# Patient Record
Sex: Female | Born: 1940 | Hispanic: No | Marital: Married | Smoking: Never smoker
Health system: Southern US, Community
[De-identification: ages and names within clinical notes are randomized; demographics above are authoritative.]

## PROBLEM LIST (undated history)

## (undated) DIAGNOSIS — I10 Essential (primary) hypertension: Secondary | ICD-10-CM

## (undated) DIAGNOSIS — R0600 Dyspnea, unspecified: Secondary | ICD-10-CM

## (undated) DIAGNOSIS — F419 Anxiety disorder, unspecified: Secondary | ICD-10-CM

## (undated) DIAGNOSIS — R011 Cardiac murmur, unspecified: Secondary | ICD-10-CM

## (undated) DIAGNOSIS — K219 Gastro-esophageal reflux disease without esophagitis: Secondary | ICD-10-CM

## (undated) DIAGNOSIS — M1990 Unspecified osteoarthritis, unspecified site: Secondary | ICD-10-CM

## (undated) HISTORY — PX: COLONOSCOPY: SHX174

## (undated) HISTORY — PX: TONSILLECTOMY: SUR1361

## (undated) HISTORY — PX: APPENDECTOMY: SHX54

## (undated) HISTORY — PX: HEMORROIDECTOMY: SUR656

---

## 2005-05-04 ENCOUNTER — Other Ambulatory Visit: Admission: RE | Admit: 2005-05-04 | Discharge: 2005-05-04 | Payer: Self-pay | Admitting: Obstetrics and Gynecology

## 2005-05-15 ENCOUNTER — Encounter: Admission: RE | Admit: 2005-05-15 | Discharge: 2005-05-15 | Payer: Self-pay | Admitting: Obstetrics and Gynecology

## 2006-09-24 ENCOUNTER — Other Ambulatory Visit: Admission: RE | Admit: 2006-09-24 | Discharge: 2006-09-24 | Payer: Self-pay | Admitting: Obstetrics and Gynecology

## 2006-09-24 ENCOUNTER — Encounter: Admission: RE | Admit: 2006-09-24 | Discharge: 2006-09-24 | Payer: Self-pay | Admitting: Obstetrics and Gynecology

## 2007-09-27 ENCOUNTER — Encounter: Admission: RE | Admit: 2007-09-27 | Discharge: 2007-09-27 | Payer: Self-pay | Admitting: Obstetrics and Gynecology

## 2008-09-27 ENCOUNTER — Encounter: Admission: RE | Admit: 2008-09-27 | Discharge: 2008-09-27 | Payer: Self-pay | Admitting: Obstetrics and Gynecology

## 2008-10-02 ENCOUNTER — Other Ambulatory Visit: Admission: RE | Admit: 2008-10-02 | Discharge: 2008-10-02 | Payer: Self-pay | Admitting: Obstetrics and Gynecology

## 2009-11-04 ENCOUNTER — Encounter: Admission: RE | Admit: 2009-11-04 | Discharge: 2009-11-04 | Payer: Self-pay | Admitting: Obstetrics and Gynecology

## 2010-11-10 ENCOUNTER — Encounter: Admission: RE | Admit: 2010-11-10 | Discharge: 2010-11-10 | Payer: Self-pay | Admitting: Internal Medicine

## 2010-11-17 ENCOUNTER — Other Ambulatory Visit
Admission: RE | Admit: 2010-11-17 | Discharge: 2010-11-17 | Payer: Self-pay | Source: Home / Self Care | Admitting: Obstetrics and Gynecology

## 2011-10-19 ENCOUNTER — Other Ambulatory Visit: Payer: Self-pay | Admitting: Internal Medicine

## 2011-10-19 DIAGNOSIS — Z1231 Encounter for screening mammogram for malignant neoplasm of breast: Secondary | ICD-10-CM

## 2011-11-17 ENCOUNTER — Ambulatory Visit
Admission: RE | Admit: 2011-11-17 | Discharge: 2011-11-17 | Disposition: A | Discharge status: Home / Self Care | Payer: Medicare Other | Source: Ambulatory Visit | Attending: Internal Medicine | Admitting: Internal Medicine

## 2011-11-17 DIAGNOSIS — Z1231 Encounter for screening mammogram for malignant neoplasm of breast: Secondary | ICD-10-CM

## 2012-07-28 ENCOUNTER — Other Ambulatory Visit: Payer: Self-pay

## 2012-08-24 ENCOUNTER — Other Ambulatory Visit: Payer: Self-pay | Admitting: *Deleted

## 2012-08-24 ENCOUNTER — Ambulatory Visit
Admission: RE | Admit: 2012-08-24 | Discharge: 2012-08-24 | Disposition: A | Discharge status: Home / Self Care | Payer: Medicare Other | Source: Ambulatory Visit | Attending: *Deleted | Admitting: *Deleted

## 2012-08-24 DIAGNOSIS — R52 Pain, unspecified: Secondary | ICD-10-CM

## 2012-08-24 DIAGNOSIS — R609 Edema, unspecified: Secondary | ICD-10-CM

## 2012-11-30 ENCOUNTER — Other Ambulatory Visit: Payer: Self-pay | Admitting: Internal Medicine

## 2012-11-30 DIAGNOSIS — Z1231 Encounter for screening mammogram for malignant neoplasm of breast: Secondary | ICD-10-CM

## 2013-01-06 ENCOUNTER — Ambulatory Visit
Admission: RE | Admit: 2013-01-06 | Discharge: 2013-01-06 | Disposition: A | Discharge status: Home / Self Care | Payer: Medicare Other | Source: Ambulatory Visit | Attending: Internal Medicine | Admitting: Internal Medicine

## 2013-01-06 DIAGNOSIS — Z1231 Encounter for screening mammogram for malignant neoplasm of breast: Secondary | ICD-10-CM

## 2013-12-25 ENCOUNTER — Other Ambulatory Visit: Payer: Self-pay | Admitting: Internal Medicine

## 2013-12-25 ENCOUNTER — Ambulatory Visit
Admission: RE | Admit: 2013-12-25 | Discharge: 2013-12-25 | Disposition: A | Discharge status: Home / Self Care | Payer: Medicare Other | Source: Ambulatory Visit | Attending: Internal Medicine | Admitting: Internal Medicine

## 2013-12-25 DIAGNOSIS — W19XXXA Unspecified fall, initial encounter: Secondary | ICD-10-CM

## 2013-12-25 DIAGNOSIS — M549 Dorsalgia, unspecified: Secondary | ICD-10-CM

## 2014-01-11 ENCOUNTER — Other Ambulatory Visit: Payer: Self-pay

## 2014-01-11 DIAGNOSIS — Z1231 Encounter for screening mammogram for malignant neoplasm of breast: Secondary | ICD-10-CM

## 2014-01-22 ENCOUNTER — Ambulatory Visit
Admission: RE | Admit: 2014-01-22 | Discharge: 2014-01-22 | Disposition: A | Discharge status: Home / Self Care | Payer: Medicare Other | Source: Ambulatory Visit

## 2014-01-22 ENCOUNTER — Ambulatory Visit: Payer: Medicare Other

## 2014-01-22 DIAGNOSIS — Z1231 Encounter for screening mammogram for malignant neoplasm of breast: Secondary | ICD-10-CM

## 2014-11-07 ENCOUNTER — Ambulatory Visit
Admission: RE | Admit: 2014-11-07 | Discharge: 2014-11-07 | Disposition: A | Discharge status: Home / Self Care | Payer: Medicare Other | Source: Ambulatory Visit | Attending: Internal Medicine | Admitting: Internal Medicine

## 2014-11-07 ENCOUNTER — Other Ambulatory Visit: Payer: Self-pay | Admitting: Internal Medicine

## 2014-11-07 DIAGNOSIS — R042 Hemoptysis: Secondary | ICD-10-CM

## 2015-03-20 ENCOUNTER — Other Ambulatory Visit: Payer: Self-pay

## 2015-03-20 DIAGNOSIS — Z1231 Encounter for screening mammogram for malignant neoplasm of breast: Secondary | ICD-10-CM

## 2015-03-26 ENCOUNTER — Ambulatory Visit
Admission: RE | Admit: 2015-03-26 | Discharge: 2015-03-26 | Disposition: A | Discharge status: Home / Self Care | Payer: Medicare Other | Source: Ambulatory Visit

## 2015-03-26 DIAGNOSIS — Z1231 Encounter for screening mammogram for malignant neoplasm of breast: Secondary | ICD-10-CM

## 2015-03-28 ENCOUNTER — Other Ambulatory Visit: Payer: Self-pay | Admitting: Internal Medicine

## 2015-03-28 DIAGNOSIS — R928 Other abnormal and inconclusive findings on diagnostic imaging of breast: Secondary | ICD-10-CM

## 2015-03-29 ENCOUNTER — Ambulatory Visit
Admission: RE | Admit: 2015-03-29 | Discharge: 2015-03-29 | Disposition: A | Discharge status: Home / Self Care | Payer: Medicare Other | Source: Ambulatory Visit | Attending: Internal Medicine | Admitting: Internal Medicine

## 2015-03-29 DIAGNOSIS — R928 Other abnormal and inconclusive findings on diagnostic imaging of breast: Secondary | ICD-10-CM

## 2015-05-21 ENCOUNTER — Other Ambulatory Visit (HOSPITAL_COMMUNITY): Payer: Self-pay | Admitting: Respiratory Therapy

## 2015-05-21 DIAGNOSIS — R05 Cough: Secondary | ICD-10-CM

## 2015-05-21 DIAGNOSIS — R053 Chronic cough: Secondary | ICD-10-CM

## 2015-05-31 ENCOUNTER — Encounter (HOSPITAL_COMMUNITY): Payer: Medicare Other

## 2015-05-31 ENCOUNTER — Ambulatory Visit (HOSPITAL_COMMUNITY)
Admission: RE | Admit: 2015-05-31 | Discharge: 2015-05-31 | Disposition: A | Discharge status: Home / Self Care | Payer: Medicare Other | Source: Ambulatory Visit | Attending: Internal Medicine | Admitting: Internal Medicine

## 2015-05-31 ENCOUNTER — Other Ambulatory Visit (HOSPITAL_COMMUNITY): Payer: Self-pay | Admitting: Respiratory Therapy

## 2015-05-31 DIAGNOSIS — R053 Chronic cough: Secondary | ICD-10-CM

## 2015-05-31 DIAGNOSIS — R05 Cough: Secondary | ICD-10-CM

## 2015-05-31 LAB — PULMONARY FUNCTION TEST
DL/VA % pred: 148 %
DL/VA: 7.05 ml/min/mmHg/L
DLCO UNC % PRED: 103 %
DLCO UNC: 24.37 ml/min/mmHg
FEF 25-75 Pre: 2.14 L/sec
FEF2575-%Pred-Pre: 132 %
FEV1-%Pred-Pre: 82 %
FEV1-PRE: 1.7 L
FEV1FVC-%PRED-PRE: 110 %
FEV6-%PRED-PRE: 78 %
FEV6-Pre: 2.05 L
FEV6FVC-%Pred-Pre: 105 %
FVC-%Pred-Pre: 74 %
FVC-Pre: 2.05 L
PRE FEV1/FVC RATIO: 83 %
PRE FEV6/FVC RATIO: 100 %

## 2015-06-06 ENCOUNTER — Other Ambulatory Visit: Payer: Self-pay | Admitting: Internal Medicine

## 2015-06-06 ENCOUNTER — Ambulatory Visit
Admission: RE | Admit: 2015-06-06 | Discharge: 2015-06-06 | Disposition: A | Discharge status: Home / Self Care | Payer: Medicare Other | Source: Ambulatory Visit | Attending: Internal Medicine | Admitting: Internal Medicine

## 2015-06-06 DIAGNOSIS — R053 Chronic cough: Secondary | ICD-10-CM

## 2015-06-06 DIAGNOSIS — R05 Cough: Secondary | ICD-10-CM

## 2015-06-19 ENCOUNTER — Other Ambulatory Visit: Payer: Self-pay | Admitting: Dermatology

## 2015-06-20 ENCOUNTER — Other Ambulatory Visit: Payer: Self-pay | Admitting: Internal Medicine

## 2015-06-20 ENCOUNTER — Ambulatory Visit
Admission: RE | Admit: 2015-06-20 | Discharge: 2015-06-20 | Disposition: A | Discharge status: Home / Self Care | Payer: Medicare Other | Source: Ambulatory Visit | Attending: Internal Medicine | Admitting: Internal Medicine

## 2015-06-20 DIAGNOSIS — J189 Pneumonia, unspecified organism: Secondary | ICD-10-CM

## 2015-06-20 DIAGNOSIS — R9389 Abnormal findings on diagnostic imaging of other specified body structures: Secondary | ICD-10-CM

## 2015-06-20 DIAGNOSIS — R053 Chronic cough: Secondary | ICD-10-CM

## 2015-06-20 DIAGNOSIS — R05 Cough: Secondary | ICD-10-CM

## 2015-06-20 MED ORDER — IOPAMIDOL (ISOVUE-300) INJECTION 61%: 75.0000 mL | Freq: Once | INTRAVENOUS | Status: DC | PRN

## 2015-06-20 MED ORDER — IOPAMIDOL (ISOVUE-300) INJECTION 61%
75.0000 mL | Freq: Once | INTRAVENOUS | Status: AC | PRN
  Administered 2015-06-20: 75 mL via INTRAVENOUS

## 2015-07-05 ENCOUNTER — Institutional Professional Consult (permissible substitution): Payer: Medicare Other | Admitting: Internal Medicine

## 2015-07-09 ENCOUNTER — Ambulatory Visit: Payer: Medicare Other | Admitting: Interventional Cardiology

## 2015-08-15 ENCOUNTER — Ambulatory Visit: Payer: Medicare Other | Admitting: Cardiology

## 2016-01-31 ENCOUNTER — Other Ambulatory Visit: Payer: Self-pay | Admitting: Obstetrics and Gynecology

## 2016-01-31 DIAGNOSIS — N644 Mastodynia: Secondary | ICD-10-CM

## 2016-02-05 ENCOUNTER — Ambulatory Visit
Admission: RE | Admit: 2016-02-05 | Discharge: 2016-02-05 | Disposition: A | Discharge status: Home / Self Care | Payer: Medicare Other | Source: Ambulatory Visit | Attending: Obstetrics and Gynecology | Admitting: Obstetrics and Gynecology

## 2016-02-05 DIAGNOSIS — N644 Mastodynia: Secondary | ICD-10-CM

## 2016-04-27 ENCOUNTER — Other Ambulatory Visit: Payer: Self-pay | Admitting: Nurse Practitioner

## 2016-04-27 ENCOUNTER — Ambulatory Visit
Admission: RE | Admit: 2016-04-27 | Discharge: 2016-04-27 | Disposition: A | Discharge status: Home / Self Care | Payer: Medicare Other | Source: Ambulatory Visit | Attending: Nurse Practitioner | Admitting: Nurse Practitioner

## 2016-04-27 DIAGNOSIS — R1030 Lower abdominal pain, unspecified: Secondary | ICD-10-CM

## 2016-08-13 ENCOUNTER — Ambulatory Visit
Admission: RE | Admit: 2016-08-13 | Discharge: 2016-08-13 | Disposition: A | Discharge status: Home / Self Care | Payer: Medicare Other | Source: Ambulatory Visit | Attending: Obstetrics & Gynecology | Admitting: Obstetrics & Gynecology

## 2016-08-13 ENCOUNTER — Other Ambulatory Visit: Payer: Self-pay | Admitting: Obstetrics & Gynecology

## 2016-08-13 DIAGNOSIS — R102 Pelvic and perineal pain: Secondary | ICD-10-CM

## 2016-08-17 ENCOUNTER — Other Ambulatory Visit: Payer: Self-pay | Admitting: Internal Medicine

## 2016-08-17 DIAGNOSIS — R1084 Generalized abdominal pain: Secondary | ICD-10-CM

## 2016-08-18 ENCOUNTER — Ambulatory Visit
Admission: RE | Admit: 2016-08-18 | Discharge: 2016-08-18 | Disposition: A | Discharge status: Home / Self Care | Payer: Medicare Other | Source: Ambulatory Visit | Attending: Internal Medicine | Admitting: Internal Medicine

## 2016-08-18 DIAGNOSIS — R1084 Generalized abdominal pain: Secondary | ICD-10-CM

## 2016-08-18 MED ORDER — IOPAMIDOL (ISOVUE-300) INJECTION 61%
100.0000 mL | Freq: Once | INTRAVENOUS | Status: AC | PRN
  Administered 2016-08-18: 100 mL via INTRAVENOUS

## 2016-08-19 ENCOUNTER — Other Ambulatory Visit: Payer: Self-pay | Admitting: General Surgery

## 2016-11-02 ENCOUNTER — Ambulatory Visit
Admission: RE | Admit: 2016-11-02 | Discharge: 2016-11-02 | Disposition: A | Discharge status: Home / Self Care | Payer: Medicare Other | Source: Ambulatory Visit | Attending: Internal Medicine | Admitting: Internal Medicine

## 2016-11-02 ENCOUNTER — Other Ambulatory Visit: Payer: Self-pay | Admitting: Internal Medicine

## 2016-11-02 DIAGNOSIS — J209 Acute bronchitis, unspecified: Secondary | ICD-10-CM

## 2016-11-10 ENCOUNTER — Inpatient Hospital Stay (HOSPITAL_COMMUNITY)
Admission: RE | Admit: 2016-11-10 | Discharge: 2016-11-10 | Disposition: A | Discharge status: Home / Self Care | Payer: Medicare Other | Source: Ambulatory Visit

## 2016-11-11 ENCOUNTER — Ambulatory Visit (INDEPENDENT_AMBULATORY_CARE_PROVIDER_SITE_OTHER): Payer: Medicare Other | Admitting: Internal Medicine

## 2016-11-11 ENCOUNTER — Encounter: Payer: Self-pay | Admitting: Internal Medicine

## 2016-11-11 ENCOUNTER — Other Ambulatory Visit: Payer: Self-pay | Admitting: Internal Medicine

## 2016-11-11 VITALS — BP 128/70 | HR 78 | Ht 63.0 in | Wt 194.0 lb

## 2016-11-11 DIAGNOSIS — R05 Cough: Secondary | ICD-10-CM

## 2016-11-11 DIAGNOSIS — R058 Other specified cough: Secondary | ICD-10-CM

## 2016-11-11 MED ORDER — ACETAMINOPHEN-CODEINE #3 300-30 MG PO TABS: ORAL_TABLET | ORAL | 0 refills | Status: DC

## 2016-11-11 MED ORDER — PANTOPRAZOLE SODIUM 40 MG PO TBEC: 40.0000 mg | DELAYED_RELEASE_TABLET | Freq: Every day | ORAL | 2 refills | Status: DC

## 2016-11-11 MED ORDER — FAMOTIDINE 20 MG PO TABS: ORAL_TABLET | ORAL | 2 refills | Status: DC

## 2016-11-11 NOTE — Patient Instructions (Addendum)
finiish the amoxicillin and prednisone   Omeprazole 40 mg  Take  30-60 min before first meal of the day and Pepcid (famotidine)  20 mg one hour before  bedtime until return to office - this is the best way to tell whether stomach acid is contributing to your problem.    Marland KitchenGERD (REFLUX)  is an extremely common cause of respiratory symptoms just like yours , many times with no obvious heartburn at all.    It can be treated with medication, but also with lifestyle changes including elevation of the head of your bed (ideally with 6 inch  bed blocks),  Smoking cessation, avoidance of late meals, excessive alcohol, and avoid fatty foods, chocolate, peppermint, colas, red wine, and acidic juices such as orange juice.  NO MINT OR MENTHOL PRODUCTS SO NO COUGH DROPS  USE SUGARLESS CANDY INSTEAD (Jolley ranchers or Stover's or Life Savers) or even ice chips will also do - the key is to swallow to prevent all throat clearing. NO OIL BASED VITAMINS - use powdered substitutes.  For cough > mucinex dm up to 1200 mg every 12 hours add (supplement) tylenol # 3 up to every 4 hours   If not better return the first week in December 2017  Late add: sinus ct/ allergy profile off pred next steps

## 2016-11-11 NOTE — Progress Notes (Addendum)
Subjective:     Patient ID: Rhonda [name], female   DOB: 05-03-1941,     MRN: [id number]  HPI   17 yowf never smoker from [place] lived in [place] since 1967 and [place] since 2006 when noted onset rhinitis mostly in [place] and became ill Oct 19 2016  while in [place] p 10 days there with  cough greens mucus  > home on Nov 5 and rx amox x7  / mucinex and then prednisone and 10 of augementin 11/10/16 and referred to pulmonary clinic 11/11/2016 by Dr   Patsey Berthold    11/11/2016 1st [facility] office visit/ Rhonda [name]   Chief Complaint  Patient presents with  . Pulmonary Consult    Referred by Dr. Fara Olden. Pt c/o cough x 3 wks- prod with green sputum.     takes daily zyrtec for hives Worse at hs / worse to point of choking but otherwise if not coughing breathing is fine  Stopped omeprazole at onset of cough  Mucus is now scant and minimally discolored vs before rx as above Using lots of cough drops seems to help, using voice makes it worse.  No obvious day to day or daytime variability or assoc  mucus plugs or hemoptysis or cp or chest tightness, subjective wheeze or overt sinus or hb symptoms. No unusual exp hx or h/o childhood pna/ asthma or knowledge of premature birth.  Sleeping ok without nocturnal  or early am exacerbation  of respiratory  c/o's or need for noct saba. Also denies any obvious fluctuation of symptoms with weather or environmental changes or other aggravating or alleviating factors except as outlined above   Current Medications, Allergies, Complete Past Medical History, Past Surgical History, Family History, and Social History were reviewed in [employer] record.  ROS  The following are not active complaints unless bolded sore throat, dysphagia, dental problems, itching, sneezing,  nasal congestion or excess/ purulent secretions, ear ache,   fever, chills, sweats, unintended wt loss, classically pleuritic or exertional cp,  orthopnea pnd or leg swelling,  presyncope, palpitations, abdominal pain, anorexia, nausea, vomiting, diarrhea  or change in bowel or bladder habits, change in stools or urine, dysuria,hematuria,  rash, arthralgias, visual complaints, headache, numbness, weakness or ataxia or problems with walking or coordination,  change in mood/affect or memory.         Review of Systems     Objective:   Physical Exam    amb wf wioyr  hoarse    Wt Readings from Last 3 Encounters:  11/11/16 194 lb (88 kg)    Vital signs reviewed - Note on arrival 02 sats  95% on RA    HEENT: nl dentition, turbinates, and oropharynx. Nl external ear canals without cough reflex   NECK :  without JVD/Nodes/TM/ nl carotid upstrokes bilaterally   LUNGS: no acc muscle use,  Nl contour chest which is clear to A and P bilaterally with cough  On exp    CV:  RRR  no s3 or murmur or increase in P2, no edema   ABD:  soft and nontender with nl inspiratory excursion in the supine position. No bruits or organomegaly, bowel sounds nl  MS:  Nl gait/ ext warm without deformities, calf tenderness, cyanosis or clubbing No obvious joint restrictions   SKIN: warm and dry without lesions    NEURO:  alert, approp, nl sensorium with  no motor deficits        I personally reviewed images and agree with  radiology impression as follows:  CXR:   11/02/16 Stable mild central pulmonary vascular prominence without pulmonary edema or infiltrate Assessment:

## 2016-11-12 ENCOUNTER — Ambulatory Visit (HOSPITAL_COMMUNITY): Admission: RE | Admit: 2016-11-12 | Payer: Medicare Other | Source: Ambulatory Visit | Admitting: General Surgery

## 2016-11-12 ENCOUNTER — Encounter (HOSPITAL_COMMUNITY): Admission: RE | Payer: Self-pay | Source: Ambulatory Visit

## 2016-11-12 SURGERY — APPENDECTOMY, LAPAROSCOPIC
Anesthesia: General

## 2016-11-12 NOTE — Assessment & Plan Note (Addendum)
Upper airway cough syndrome (previously labeled PNDS) , is  so named because it's frequently impossible to sort out how much is  CR/sinusitis with freq throat clearing (which can be related to primary GERD)   vs  causing  secondary (" extra esophageal")  GERD from wide swings in gastric pressure that occur with throat clearing, often  promoting self use of mint and menthol lozenges that reduce the lower esophageal sphincter tone and exacerbate the problem further in a cyclical fashion.   These are the same pts (now being labeled as having "irritable larynx syndrome" by some cough centers) who not infrequently have a history of having failed to tolerate ace inhibitors,  dry powder inhalers or biphosphonates or report having atypical/extraesophageal reflux symptoms that don't respond to standard doses of PPI  and are easily confused as having aecopd or asthma flares by even experienced allergists/ pulmonologists (myself included).    Of the three most common causes of chronic cough, only one (GERD)  can actually cause the other two (asthma and post nasal drip syndrome)  and perpetuate the cylce of cough inducing airway trauma, inflammation, heightened sensitivity to reflux which is prompted by the cough itself via a cyclical mechanism.    This may partially respond to steroids and look like asthma and post nasal drainage but never erradicated completely unless the cough and the secondary reflux are eliminated, preferably both at the same time.  While not intuitively obvious, many patients with chronic low grade reflux do not cough until there is a secondary insult that disturbs the protective epithelial barrier and exposes sensitive nerve endings.  This can be viral or direct physical injury such as with an endotracheal tube.   The point is that once this occurs, it is difficult to eliminate using anything but a maximally effective acid suppression regimen at least in the short run, accompanied by an  appropriate diet to address non acid GERD.    So to summarize I think this is uacs precipitated by the pt stopping her gerd rx in the midst of a severe URI which was probably initially viral and would only add at this point the rec for a CT sinus if persists once back on max gerd rx and finishes present course of abx and prednisone  Total time devoted to counseling  = 35/73m review case with pt/husband discussion of options/alternatives/ personally creating written instructions  in presence of pt  then going over those specific  Instructions directly with the pt including how to use all of the meds but in particular covering each new medication in detail and the difference between the maintenance/automatic meds and the prns using an action plan format for the latter.

## 2016-12-01 ENCOUNTER — Institutional Professional Consult (permissible substitution): Payer: Medicare Other | Admitting: Internal Medicine

## 2016-12-08 ENCOUNTER — Ambulatory Visit: Payer: Self-pay | Admitting: General Surgery

## 2016-12-30 ENCOUNTER — Other Ambulatory Visit (HOSPITAL_COMMUNITY): Payer: Medicare Other

## 2016-12-31 ENCOUNTER — Encounter (HOSPITAL_COMMUNITY)
Admission: RE | Admit: 2016-12-31 | Discharge: 2016-12-31 | Disposition: A | Discharge status: Home / Self Care | Payer: Medicare Other | Source: Ambulatory Visit | Attending: General Surgery | Admitting: General Surgery

## 2016-12-31 ENCOUNTER — Encounter (HOSPITAL_COMMUNITY): Payer: Self-pay

## 2016-12-31 DIAGNOSIS — Z01818 Encounter for other preprocedural examination: Secondary | ICD-10-CM | POA: Diagnosis not present

## 2016-12-31 HISTORY — DX: Essential (primary) hypertension: I10

## 2016-12-31 HISTORY — DX: Dyspnea, unspecified: R06.00

## 2016-12-31 HISTORY — DX: Anxiety disorder, unspecified: F41.9

## 2016-12-31 HISTORY — DX: Cardiac murmur, unspecified: R01.1

## 2016-12-31 HISTORY — DX: Unspecified osteoarthritis, unspecified site: M19.90

## 2016-12-31 HISTORY — DX: Gastro-esophageal reflux disease without esophagitis: K21.9

## 2016-12-31 LAB — CBC
HCT: 39.8 % (ref 36.0–46.0)
Hemoglobin: 13.2 g/dL (ref 12.0–15.0)
MCH: 31.9 pg (ref 26.0–34.0)
MCHC: 33.2 g/dL (ref 30.0–36.0)
MCV: 96.1 fL (ref 78.0–100.0)
Platelets: 165 10*3/uL (ref 150–400)
RBC: 4.14 MIL/uL (ref 3.87–5.11)
RDW: 13.9 % (ref 11.5–15.5)
WBC: 7 10*3/uL (ref 4.0–10.5)

## 2016-12-31 LAB — BASIC METABOLIC PANEL
Anion gap: 8 (ref 5–15)
BUN: 15 mg/dL (ref 6–20)
CALCIUM: 9.6 mg/dL (ref 8.9–10.3)
CO2: 28 mmol/L (ref 22–32)
Chloride: 102 mmol/L (ref 101–111)
Creatinine, Ser: 1.02 mg/dL — ABNORMAL HIGH (ref 0.44–1.00)
GFR calc Af Amer: >60 mL/min (ref >60)
GFR, EST NON AFRICAN AMERICAN: 52 mL/min — AB (ref >60)
GLUCOSE: 110 mg/dL — AB (ref 65–99)
Potassium: 3.9 mmol/L (ref 3.5–5.1)
SODIUM: 138 mmol/L (ref 135–145)

## 2016-12-31 NOTE — Progress Notes (Signed)
Pt. Reports that a previously scheduled Lap Appy had to be cancelled due to Bronchitis. Pt. Reports That the bronchitis is still not completely cleared . Pt.'s voice is raspy. Pt. Denies fever, chills, body aches, although she suffers with RA & feels that what (discomfort/aches in body)  & what she is having in her hands etc. Is normal for her & the meds. that she is on has her OA & RA under good control. Pt. Doesn't recall why but saw Dr. Wynonia Lawman & cardiac eval in 2016.  Have requested records from Dr. Wynonia Lawman.

## 2016-12-31 NOTE — Pre-Procedure Instructions (Signed)
Rhonda [name]  12/31/2016      [facility] [place], [place] [address] [place] [address] Phone: [phone] Fax: [place], [place] [facility] [address] [place] [address] [place] [address] Phone: [phone] Fax: [phone]    Your procedure is scheduled on 01/06/2017  Report to [facility] Admitting at 6:30 A.M.  Call this number if you have problems the morning of surgery:  [phone]   Remember:  Do not eat food or drink liquids after midnight. Tuesday   Take these medicines the morning of surgery with A SIP OF WATER : LEVOTHYROXINE, OMEPRAZOLE   Do not wear jewelry, make-up or nail polish.   Do not wear lotions, powders, or perfumes, or deoderant.   Do not shave 48 hours prior to surgery.     Do not bring valuables to the hospital.   [facility] is not responsible for any belongings or valuables.  Contacts, dentures or bridgework may not be worn into surgery.  Leave your suitcase in the car.  After surgery it may be brought to your room.  For patients admitted to the hospital, discharge time will be determined by your treatment team.  Patients discharged the day of surgery will not be allowed to drive home.   Name and phone number of your driver:   With spouse  Special instructions:  Special Instructions:  - Preparing for Surgery  Before surgery, you can play an important role.  Because skin is not sterile, your skin needs to be as free of germs as possible.  You can reduce the number of germs on you skin by washing with CHG (chlorahexidine gluconate) soap before surgery.  CHG is an antiseptic cleaner which kills germs and bonds with the skin to continue killing germs even after washing.  Please DO NOT use if you have an allergy to CHG or antibacterial soaps.  If your skin becomes reddened/irritated stop using  the CHG and inform your nurse when you arrive at Short Stay.  Do not shave (including legs and underarms) for at least 48 hours prior to the first CHG shower.  You may shave your face.  Please follow these instructions carefully:   1.  Shower with CHG Soap the night before surgery and the  morning of Surgery.  2.  If you choose to wash your hair, wash your hair first as usual with your  normal shampoo.  3.  After you shampoo, rinse your hair and body thoroughly to remove the  Shampoo.  4.  Use CHG as you would any other liquid soap.  You can apply chg directly to the skin and wash gently with scrungie or a clean washcloth.  5.  Apply the CHG Soap to your body ONLY FROM THE NECK DOWN.    Do not use on open wounds or open sores.  Avoid contact with your eyes, ears, mouth and genitals (private parts).  Wash genitals (private parts)   with your normal soap.  6.  Wash thoroughly, paying special attention to the area where your surgery will be performed.  7.  Thoroughly rinse your body with warm water from the neck down.  8.  DO NOT shower/wash with your normal soap after using and rinsing off   the CHG Soap.  9.  Pat yourself dry with a clean  towel.            10.  Wear clean pajamas.            11.  Place clean sheets on your bed the night of your first shower and do not sleep with pets.  Day of Surgery  Do not apply any lotions/deodorants the morning of surgery.  Please wear clean clothes to the hospital/surgery center.  Please read over the following fact sheets that you were given. Pain Booklet, Coughing and Deep Breathing and Surgical Site Infection Prevention

## 2017-01-01 NOTE — Progress Notes (Signed)
Anesthesia Chart Review:  Pt is a 76 year old female scheduled for laparoscopic appendectomy on 01/06/2017 with Autumn Messing III, MD.   PMH includes:  HTN, heart murmur, anxiety post-anesthesia, GERD. Never smoker. BMI 34  BP 129/79   Pulse 69   Temp 36.5 C   Resp 20   Ht 5' 3.5" (1.613 m)   Wt 193 lb 12.8 oz (87.9 kg)   SpO2 100%   BMI 33.79 kg/m    Medications include: pepcid, flovent, plaquenil, levothyroxine, methotrexate, olmesartan, prilosec, simvastatin  CXR 11/02/16: Stable mild central pulmonary vascular prominence without pulmonary edema or infiltrate. Calcified aorta.  EKG 12/31/16: NSR.   Treadmill stress test 08/05/15 Wynonia Lawman cardiology): 1. Clinically and EKG negative for ischemia 2. Reduced exercise capacity for age  Pt saw Tollie Eth, MD with cardiology 07/09/2015 for eval for chest pain. Stress test results above. Does not regularly see cardiology.   Pt reported at PAT her surgery was originally scheduled for 10/2016 but was postponed because had bronchitis.  Pt feels bronchitis is still lingering. However, pt was evaluated by pulmonologist Christinia Gully, MD on 11/11/16 and diagnosed with upper airway cough syndrome, not bronchitis.    Pt will need further assessment DOS by assigned anesthesiologist.  If no acute symptoms, I anticipate pt can proceed with surgery as scheduled.   Willeen Cass, FNP-BC [facility] Short Stay Surgical Center/Anesthesiology Phone: [phone] 01/01/2017 4:01 PM

## 2017-01-05 MED ORDER — CEFOTETAN DISODIUM-DEXTROSE 2-2.08 GM-% IV SOLR
2.0000 g | INTRAVENOUS | Status: AC
Start: 2017-01-06 — End: 2017-01-06
  Administered 2017-01-06: 2 g via INTRAVENOUS
  Filled 2017-01-05: qty 50

## 2017-01-06 ENCOUNTER — Encounter (HOSPITAL_COMMUNITY)
Admission: RE | Disposition: A | Discharge status: Home / Self Care | Payer: Self-pay | Source: Ambulatory Visit | Attending: General Surgery

## 2017-01-06 ENCOUNTER — Ambulatory Visit (HOSPITAL_COMMUNITY)
Admission: RE | Admit: 2017-01-06 | Discharge: 2017-01-07 | Disposition: A | Discharge status: Home / Self Care | Payer: Medicare Other | Source: Ambulatory Visit | Attending: General Surgery | Admitting: General Surgery

## 2017-01-06 ENCOUNTER — Ambulatory Visit (HOSPITAL_COMMUNITY): Payer: Medicare Other | Admitting: Emergency Medicine

## 2017-01-06 ENCOUNTER — Encounter (HOSPITAL_COMMUNITY): Payer: Self-pay | Admitting: *Deleted

## 2017-01-06 ENCOUNTER — Ambulatory Visit (HOSPITAL_COMMUNITY): Payer: Medicare Other | Admitting: Anesthesiology

## 2017-01-06 DIAGNOSIS — Z803 Family history of malignant neoplasm of breast: Secondary | ICD-10-CM | POA: Insufficient documentation

## 2017-01-06 DIAGNOSIS — K219 Gastro-esophageal reflux disease without esophagitis: Secondary | ICD-10-CM | POA: Diagnosis not present

## 2017-01-06 DIAGNOSIS — R109 Unspecified abdominal pain: Secondary | ICD-10-CM | POA: Diagnosis present

## 2017-01-06 DIAGNOSIS — F419 Anxiety disorder, unspecified: Secondary | ICD-10-CM | POA: Insufficient documentation

## 2017-01-06 DIAGNOSIS — Z800 Family history of malignant neoplasm of digestive organs: Secondary | ICD-10-CM | POA: Diagnosis not present

## 2017-01-06 DIAGNOSIS — Z7952 Long term (current) use of systemic steroids: Secondary | ICD-10-CM | POA: Insufficient documentation

## 2017-01-06 DIAGNOSIS — E079 Disorder of thyroid, unspecified: Secondary | ICD-10-CM | POA: Insufficient documentation

## 2017-01-06 DIAGNOSIS — Z79899 Other long term (current) drug therapy: Secondary | ICD-10-CM | POA: Diagnosis not present

## 2017-01-06 DIAGNOSIS — D490 Neoplasm of unspecified behavior of digestive system: Secondary | ICD-10-CM | POA: Diagnosis not present

## 2017-01-06 DIAGNOSIS — K388 Other specified diseases of appendix: Secondary | ICD-10-CM | POA: Diagnosis present

## 2017-01-06 DIAGNOSIS — E7800 Pure hypercholesterolemia, unspecified: Secondary | ICD-10-CM | POA: Insufficient documentation

## 2017-01-06 HISTORY — PX: LAPAROSCOPIC APPENDECTOMY: SHX408

## 2017-01-06 SURGERY — APPENDECTOMY, LAPAROSCOPIC
Anesthesia: General

## 2017-01-06 MED ORDER — DEXAMETHASONE SODIUM PHOSPHATE 10 MG/ML IJ SOLN
INTRAMUSCULAR | Status: DC | PRN
  Administered 2017-01-06: 10 mg via INTRAVENOUS

## 2017-01-06 MED ORDER — MIDAZOLAM HCL 2 MG/2ML IJ SOLN
INTRAMUSCULAR | Status: AC
  Filled 2017-01-06: qty 2

## 2017-01-06 MED ORDER — CARBOXYMETHYLCELLULOSE SODIUM 1 % OP SOLN: 1.0000 [drp] | Freq: Every day | OPHTHALMIC | Status: DC

## 2017-01-06 MED ORDER — HYDROXYCHLOROQUINE SULFATE 200 MG PO TABS
200.0000 mg | ORAL_TABLET | Freq: Two times a day (BID) | ORAL | Status: DC
  Administered 2017-01-06 (×2): 200 mg via ORAL
  Filled 2017-01-06 (×3): qty 1

## 2017-01-06 MED ORDER — SUCCINYLCHOLINE CHLORIDE 200 MG/10ML IV SOSY
PREFILLED_SYRINGE | INTRAVENOUS | Status: AC
  Filled 2017-01-06: qty 10

## 2017-01-06 MED ORDER — CHLORHEXIDINE GLUCONATE CLOTH 2 % EX PADS: 6.0000 | MEDICATED_PAD | Freq: Once | CUTANEOUS | Status: DC

## 2017-01-06 MED ORDER — LACTATED RINGERS IV SOLN
INTRAVENOUS | Status: DC | PRN
  Administered 2017-01-06 (×2): via INTRAVENOUS

## 2017-01-06 MED ORDER — MIDAZOLAM HCL 5 MG/5ML IJ SOLN
INTRAMUSCULAR | Status: DC | PRN
  Administered 2017-01-06: 2 mg via INTRAVENOUS

## 2017-01-06 MED ORDER — PANTOPRAZOLE SODIUM 40 MG PO TBEC
40.0000 mg | DELAYED_RELEASE_TABLET | Freq: Every day | ORAL | Status: DC
  Administered 2017-01-06: 40 mg via ORAL
  Filled 2017-01-06: qty 1

## 2017-01-06 MED ORDER — FENTANYL CITRATE (PF) 100 MCG/2ML IJ SOLN
INTRAMUSCULAR | Status: AC
  Filled 2017-01-06: qty 2

## 2017-01-06 MED ORDER — NEOSTIGMINE METHYLSULFATE 10 MG/10ML IV SOLN
INTRAVENOUS | Status: DC | PRN
  Administered 2017-01-06: 4 mg via INTRAVENOUS

## 2017-01-06 MED ORDER — MORPHINE SULFATE (PF) 2 MG/ML IV SOLN: 1.0000 mg | INTRAVENOUS | Status: DC | PRN

## 2017-01-06 MED ORDER — DEXAMETHASONE SODIUM PHOSPHATE 10 MG/ML IJ SOLN
INTRAMUSCULAR | Status: AC
  Filled 2017-01-06: qty 1

## 2017-01-06 MED ORDER — METHOTREXATE 2.5 MG PO TABS: 10.0000 mg | ORAL_TABLET | ORAL | Status: DC

## 2017-01-06 MED ORDER — 0.9 % SODIUM CHLORIDE (POUR BTL) OPTIME
TOPICAL | Status: DC | PRN
  Administered 2017-01-06: 1000 mL

## 2017-01-06 MED ORDER — ONDANSETRON 4 MG PO TBDP: 4.0000 mg | ORAL_TABLET | Freq: Four times a day (QID) | ORAL | Status: DC | PRN

## 2017-01-06 MED ORDER — LEVOTHYROXINE SODIUM 25 MCG PO TABS
25.0000 ug | ORAL_TABLET | Freq: Every day | ORAL | Status: DC
  Administered 2017-01-07: 25 ug via ORAL
  Filled 2017-01-06: qty 1

## 2017-01-06 MED ORDER — BUPIVACAINE HCL 0.25 % IJ SOLN
INTRAMUSCULAR | Status: DC | PRN
  Administered 2017-01-06: 17 mL

## 2017-01-06 MED ORDER — POLYVINYL ALCOHOL 1.4 % OP SOLN
1.0000 [drp] | Freq: Every day | OPHTHALMIC | Status: DC
  Administered 2017-01-06: 1 [drp] via OPHTHALMIC
  Filled 2017-01-06: qty 15

## 2017-01-06 MED ORDER — PHENYLEPHRINE 40 MCG/ML (10ML) SYRINGE FOR IV PUSH (FOR BLOOD PRESSURE SUPPORT)
PREFILLED_SYRINGE | INTRAVENOUS | Status: DC | PRN
  Administered 2017-01-06: 80 ug via INTRAVENOUS
  Administered 2017-01-06: 40 ug via INTRAVENOUS
  Administered 2017-01-06 (×2): 80 ug via INTRAVENOUS

## 2017-01-06 MED ORDER — PROPOFOL 10 MG/ML IV BOLUS
INTRAVENOUS | Status: AC
  Filled 2017-01-06: qty 20

## 2017-01-06 MED ORDER — HYDROCODONE-ACETAMINOPHEN 5-325 MG PO TABS
ORAL_TABLET | ORAL | Status: AC
  Filled 2017-01-06: qty 1

## 2017-01-06 MED ORDER — BUDESONIDE 0.25 MG/2ML IN SUSP
0.2500 mg | Freq: Two times a day (BID) | RESPIRATORY_TRACT | Status: DC
  Administered 2017-01-06 – 2017-01-07 (×2): 0.25 mg via RESPIRATORY_TRACT
  Filled 2017-01-06 (×2): qty 2

## 2017-01-06 MED ORDER — ONDANSETRON HCL 4 MG/2ML IJ SOLN
INTRAMUSCULAR | Status: DC | PRN
  Administered 2017-01-06: 4 mg via INTRAVENOUS

## 2017-01-06 MED ORDER — IRBESARTAN 75 MG PO TABS
37.5000 mg | ORAL_TABLET | Freq: Every day | ORAL | Status: DC
  Administered 2017-01-06: 37.5 mg via ORAL
  Filled 2017-01-06: qty 1

## 2017-01-06 MED ORDER — BUPIVACAINE HCL (PF) 0.25 % IJ SOLN
INTRAMUSCULAR | Status: AC
  Filled 2017-01-06: qty 30

## 2017-01-06 MED ORDER — ONDANSETRON HCL 4 MG/2ML IJ SOLN
INTRAMUSCULAR | Status: AC
  Filled 2017-01-06: qty 2

## 2017-01-06 MED ORDER — LIDOCAINE 2% (20 MG/ML) 5 ML SYRINGE
INTRAMUSCULAR | Status: AC
  Filled 2017-01-06: qty 5

## 2017-01-06 MED ORDER — ONDANSETRON HCL 4 MG/2ML IJ SOLN: 4.0000 mg | Freq: Four times a day (QID) | INTRAMUSCULAR | Status: DC | PRN

## 2017-01-06 MED ORDER — HYDROCODONE-ACETAMINOPHEN 5-325 MG PO TABS
1.0000 | ORAL_TABLET | ORAL | Status: DC | PRN
  Administered 2017-01-06 (×2): 1 via ORAL
  Filled 2017-01-06: qty 1

## 2017-01-06 MED ORDER — FENTANYL CITRATE (PF) 100 MCG/2ML IJ SOLN
25.0000 ug | INTRAMUSCULAR | Status: DC | PRN
  Administered 2017-01-06: 50 ug via INTRAVENOUS
  Administered 2017-01-06: 25 ug via INTRAVENOUS
  Administered 2017-01-06: 50 ug via INTRAVENOUS
  Administered 2017-01-06: 25 ug via INTRAVENOUS

## 2017-01-06 MED ORDER — KCL IN DEXTROSE-NACL 20-5-0.9 MEQ/L-%-% IV SOLN
INTRAVENOUS | Status: DC
  Administered 2017-01-06: 13:00:00 via INTRAVENOUS
  Filled 2017-01-06 (×3): qty 1000

## 2017-01-06 MED ORDER — GLYCOPYRROLATE 0.2 MG/ML IJ SOLN
INTRAMUSCULAR | Status: DC | PRN
  Administered 2017-01-06: 0.6 mg via INTRAVENOUS

## 2017-01-06 MED ORDER — HYDROXYZINE HCL 10 MG PO TABS
10.0000 mg | ORAL_TABLET | Freq: Four times a day (QID) | ORAL | Status: DC | PRN
Start: 2017-01-06 — End: 2017-01-07

## 2017-01-06 MED ORDER — METHOCARBAMOL 500 MG PO TABS
500.0000 mg | ORAL_TABLET | Freq: Four times a day (QID) | ORAL | Status: DC | PRN
  Administered 2017-01-06: 500 mg via ORAL
  Filled 2017-01-06: qty 1

## 2017-01-06 MED ORDER — PHENYLEPHRINE 40 MCG/ML (10ML) SYRINGE FOR IV PUSH (FOR BLOOD PRESSURE SUPPORT)
PREFILLED_SYRINGE | INTRAVENOUS | Status: AC
  Filled 2017-01-06: qty 10

## 2017-01-06 MED ORDER — ROCURONIUM BROMIDE 100 MG/10ML IV SOLN
INTRAVENOUS | Status: DC | PRN
  Administered 2017-01-06: 50 mg via INTRAVENOUS

## 2017-01-06 MED ORDER — SIMVASTATIN 40 MG PO TABS
40.0000 mg | ORAL_TABLET | Freq: Every evening | ORAL | Status: DC
  Administered 2017-01-06: 40 mg via ORAL
  Filled 2017-01-06: qty 1

## 2017-01-06 MED ORDER — LIDOCAINE HCL (CARDIAC) 20 MG/ML IV SOLN
INTRAVENOUS | Status: DC | PRN
Start: 2017-01-06 — End: 2017-01-06
  Administered 2017-01-06: 50 mg via INTRAVENOUS

## 2017-01-06 MED ORDER — FENTANYL CITRATE (PF) 100 MCG/2ML IJ SOLN
INTRAMUSCULAR | Status: DC | PRN
  Administered 2017-01-06 (×2): 25 ug via INTRAVENOUS
  Administered 2017-01-06: 50 ug via INTRAVENOUS

## 2017-01-06 MED ORDER — ROCURONIUM BROMIDE 50 MG/5ML IV SOSY
PREFILLED_SYRINGE | INTRAVENOUS | Status: AC
  Filled 2017-01-06: qty 5

## 2017-01-06 MED ORDER — HEPARIN SODIUM (PORCINE) 5000 UNIT/ML IJ SOLN
5000.0000 [IU] | Freq: Three times a day (TID) | INTRAMUSCULAR | Status: DC
  Administered 2017-01-07: 5000 [IU] via SUBCUTANEOUS
  Filled 2017-01-06: qty 1

## 2017-01-06 MED ORDER — PROPOFOL 10 MG/ML IV BOLUS
INTRAVENOUS | Status: DC | PRN
  Administered 2017-01-06: 140 mg via INTRAVENOUS

## 2017-01-06 SURGICAL SUPPLY — 37 items
ADH SKN CLS APL DERMABOND .7 (GAUZE/BANDAGES/DRESSINGS) ×1
APPLIER CLIP ROT 10 11.4 M/L (STAPLE)
APR CLP MED LRG 11.4X10 (STAPLE)
BAG SPEC RTRVL LRG 6X4 10 (ENDOMECHANICALS) ×1
BLADE SURG ROTATE 9660 (MISCELLANEOUS) IMPLANT
CANISTER SUCTION 2500CC (MISCELLANEOUS) ×3 IMPLANT
CHLORAPREP W/TINT 26ML (MISCELLANEOUS) ×3 IMPLANT
CLIP APPLIE ROT 10 11.4 M/L (STAPLE) IMPLANT
COVER SURGICAL LIGHT HANDLE (MISCELLANEOUS) ×3 IMPLANT
CUTTER FLEX LINEAR 45M (STAPLE) ×3 IMPLANT
DERMABOND ADVANCED (GAUZE/BANDAGES/DRESSINGS) ×2
DERMABOND ADVANCED .7 DNX12 (GAUZE/BANDAGES/DRESSINGS) ×1 IMPLANT
ELECT REM PT RETURN 9FT ADLT (ELECTROSURGICAL) ×3
ELECTRODE REM PT RTRN 9FT ADLT (ELECTROSURGICAL) ×1 IMPLANT
ENDOLOOP SUT PDS II  0 18 (SUTURE)
ENDOLOOP SUT PDS II 0 18 (SUTURE) IMPLANT
GLOVE BIO SURGEON STRL SZ7.5 (GLOVE) ×3 IMPLANT
GOWN STRL REUS W/ TWL LRG LVL3 (GOWN DISPOSABLE) ×3 IMPLANT
GOWN STRL REUS W/TWL LRG LVL3 (GOWN DISPOSABLE) ×9
KIT BASIN OR (CUSTOM PROCEDURE TRAY) ×3 IMPLANT
KIT ROOM TURNOVER OR (KITS) ×3 IMPLANT
NS IRRIG 1000ML POUR BTL (IV SOLUTION) ×3 IMPLANT
PAD ARMBOARD 7.5X6 YLW CONV (MISCELLANEOUS) ×6 IMPLANT
POUCH SPECIMEN RETRIEVAL 10MM (ENDOMECHANICALS) ×3 IMPLANT
RELOAD STAPLE 45 3.5 BLU ETS (ENDOMECHANICALS) ×1 IMPLANT
RELOAD STAPLE TA45 3.5 REG BLU (ENDOMECHANICALS) ×3 IMPLANT
SCALPEL HARMONIC ACE (MISCELLANEOUS) ×3 IMPLANT
SET IRRIG TUBING LAPAROSCOPIC (IRRIGATION / IRRIGATOR) ×3 IMPLANT
SPECIMEN JAR SMALL (MISCELLANEOUS) ×3 IMPLANT
SUT MNCRL AB 4-0 PS2 18 (SUTURE) ×3 IMPLANT
TOWEL OR 17X24 6PK STRL BLUE (TOWEL DISPOSABLE) ×3 IMPLANT
TOWEL OR 17X26 10 PK STRL BLUE (TOWEL DISPOSABLE) ×3 IMPLANT
TRAY FOLEY CATH 16FR SILVER (SET/KITS/TRAYS/PACK) ×3 IMPLANT
TRAY LAPAROSCOPIC MC (CUSTOM PROCEDURE TRAY) ×3 IMPLANT
TROCAR XCEL BLUNT TIP 100MML (ENDOMECHANICALS) ×3 IMPLANT
TROCAR XCEL NON-BLD 5MMX100MML (ENDOMECHANICALS) ×6 IMPLANT
TUBING INSUFFLATION (TUBING) ×3 IMPLANT

## 2017-01-06 NOTE — H&P (Signed)
Rhonda [name]. [place]  Location: [facility] Patient #: [id number] DOB: Jan 19, 1941 Married / Language: English / Race: White Female   History of Present Illness  The patient is a 76 year old female who presents with abdominal pain. We are asked to see the patient in consultation by Dr.Varadarajan to evaluate her for a intra-abdominal mass. The patient is a 76 year old female who developed pain on the left side of her abdomen on Monday evening. She had some slight nausea associated with it. As part of her workup she underwent a CT scan which showed a very mild case of diverticulitis of the descending colon. It also showed a 5 cm mass where the appendix should be. The patient states she has always had problems with constipation. About 4 months ago she had a similar left-sided pain that resolved with laxatives. She denies any fevers or chills. She denies any significant weight loss.   Other Problems  Anxiety Disorder  Arthritis  Diverticulosis  Gastroesophageal Reflux Disease  Heart murmur  Hemorrhoids  High blood pressure  Hypercholesterolemia  Thyroid Disease   Past Surgical History Hemorrhoidectomy  Tonsillectomy   Diagnostic Studies History  Colonoscopy  >10 years ago Mammogram  within last year  Allergies  Aleve *ANALGESICS - ANTI-INFLAMMATORY*  Lisinopril *ANTIHYPERTENSIVES*  AmLODIPine Besylate *CALCIUM CHANNEL BLOCKERS*   Medication History ALPRAZolam (0.5MG  Tablet, Oral as needed) Active. Levothyroxine Sodium (25MCG Tablet, Oral) Active. Hydroxychloroquine Sulfate (200MG  Tablet, Oral) Active. Omeprazole (40MG  Capsule DR, Oral) Active. PredniSONE (5MG  Tablet, Oral) Active. Simvastatin (40MG  Tablet, Oral) Active. RaNITidine HCl (150MG  Tablet, Oral) Active. MetroNIDAZOLE (500MG  Tablet, Oral) Active. Ciprofloxacin HCl (500MG  Tablet, Oral) Active. Methotrexate (2.5MG  Tablet, Oral) Active. Medications Reconciled  Social History   Alcohol use  Moderate alcohol use. Caffeine use  Coffee, Tea. No drug use  Tobacco use  Never smoker.  Family History  Breast Cancer  Daughter. Colon Cancer  Daughter. Heart Disease  Sister.  Pregnancy / Birth History  Age at menarche  30 years. Age of menopause  12-55 Gravida  3 Length (months) of breastfeeding  3-6 Maternal age  41-20 Para  2    Review of Systems General Not Present- Appetite Loss, Chills, Fatigue, Fever, Night Sweats, Weight Gain and Weight Loss. Skin Not Present- Change in Wart/Mole, Dryness, Hives, Jaundice, New Lesions, Non-Healing Wounds, Rash and Ulcer. HEENT Present- Wears glasses/contact lenses. Not Present- Earache, Hearing Loss, Hoarseness, Nose Bleed, Oral Ulcers, Ringing in the Ears, Seasonal Allergies, Sinus Pain, Sore Throat, Visual Disturbances and Yellow Eyes. Respiratory Not Present- Bloody sputum, Chronic Cough, Difficulty Breathing, Snoring and Wheezing. Breast Not Present- Breast Mass, Breast Pain, Nipple Discharge and Skin Changes. Cardiovascular Not Present- Chest Pain, Difficulty Breathing Lying Down, Leg Cramps, Palpitations, Rapid Heart Rate, Shortness of Breath and Swelling of Extremities. Gastrointestinal Present- Abdominal Pain, Bloating, Constipation and Excessive gas. Not Present- Bloody Stool, Change in Bowel Habits, Chronic diarrhea, Difficulty Swallowing, Gets full quickly at meals, Hemorrhoids, Indigestion, Nausea, Rectal Pain and Vomiting. Female Genitourinary Present- Frequency. Not Present- Nocturia, Painful Urination, Pelvic Pain and Urgency. Musculoskeletal Not Present- Back Pain, Joint Pain, Joint Stiffness, Muscle Pain, Muscle Weakness and Swelling of Extremities. Psychiatric Present- Anxiety. Not Present- Bipolar, Change in Sleep Pattern, Depression, Fearful and Frequent crying. Endocrine Not Present- Cold Intolerance, Excessive Hunger, Hair Changes, Heat Intolerance, Hot flashes and New Diabetes. Hematology  Present- Easy Bruising. Not Present- Blood Thinners, Excessive bleeding, Gland problems, HIV and Persistent Infections.  Vitals  Weight: 195 lb Height: 63in Body Surface Area: 1.91 m Body  Mass Index: 34.54 kg/m  Temp.: 5F(Temporal)  Pulse: 77 (Regular)  BP: 128/82 (Sitting, Left Arm, Standard)       Physical Exam General Mental Status-Alert. General Appearance-Consistent with stated age. Hydration-Well hydrated. Voice-Normal.  Head and Neck Head-normocephalic, atraumatic with no lesions or palpable masses. Trachea-midline. Thyroid Gland Characteristics - normal size and consistency.  Eye Eyeball - Bilateral-Extraocular movements intact. Sclera/Conjunctiva - Bilateral-No scleral icterus.  Chest and Lung Exam Chest and lung exam reveals -quiet, even and easy respiratory effort with no use of accessory muscles and on auscultation, normal breath sounds, no adventitious sounds and normal vocal resonance. Inspection Chest Wall - Normal. Back - normal.  Cardiovascular Cardiovascular examination reveals -normal heart sounds, regular rate and rhythm with no murmurs and normal pedal pulses bilaterally.  Abdomen Inspection Inspection of the abdomen reveals - No Hernias. Skin - Scar - no surgical scars. Palpation/Percussion Palpation and Percussion of the abdomen reveal - Soft, Non Tender, No Rebound tenderness, No Rigidity (guarding) and No hepatosplenomegaly. Auscultation Auscultation of the abdomen reveals - Bowel sounds normal.  Neurologic Neurologic evaluation reveals -alert and oriented x 3 with no impairment of recent or remote memory. Mental Status-Normal.  Musculoskeletal Normal Exam - Left-Upper Extremity Strength Normal and Lower Extremity Strength Normal. Normal Exam - Right-Upper Extremity Strength Normal and Lower Extremity Strength Normal.  Lymphatic Head & Neck  General Head & Neck Lymphatics: Bilateral -  Description - Normal. Axillary  General Axillary Region: Bilateral - Description - Normal. Tenderness - Non Tender. Femoral & Inguinal  Generalized Femoral & Inguinal Lymphatics: Bilateral - Description - Normal. Tenderness - Non Tender.    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 Because of its abnormal appearance and because of the risk of cancer I would recommend that this mass be removed. I have discussed with her in detail the risks and benefits of the operation to remove it as well as some of the technical aspects and she understands and wishes to proceed.

## 2017-01-06 NOTE — Op Note (Signed)
01/06/2017  9:47 AM  PATIENT:  Rhonda [name]  76 y.o. female  PRE-OPERATIVE DIAGNOSIS:  MASS OF APPENDIX  POST-OPERATIVE DIAGNOSIS:  MASS OF APPENDIX  PROCEDURE:  Procedure(s): LAPAROSCOPIC APPENDECTOMY (N/A)  SURGEON:  Surgeon(s) and Role:    * Jovita Kussmaul, MD - Primary  PHYSICIAN ASSISTANT:   ASSISTANTS: none   ANESTHESIA:   local and general  EBL:  Total I/O In: 1000 [I.V.:1000] Out: 300 [Urine:300]  BLOOD ADMINISTERED:none  DRAINS: none   LOCAL MEDICATIONS USED:  MARCAINE     SPECIMEN:  Source of Specimen:  appendix  DISPOSITION OF SPECIMEN:  PATHOLOGY  COUNTS:  YES  TOURNIQUET:  * No tourniquets in log *  DICTATION: .Dragon Dictation   After informed consent was obtained patient was brought to the operating room placed in the supine position on the operating room table. After adequate induction of general anesthesia the patient's abdomen was prepped with ChloraPrep, allowed to dry, and draped in usual sterile manner. The area below the umbilicus was infiltrated with quarter percent Marcaine. A small incision was made with a 15 blade knife. This incision was carried down through the subcutaneous tissue bluntly with a hemostat and Army-Navy retractors until the linea alba was identified. The linea alba was incised with a 15 blade knife. Each side was grasped Coker clamps and elevated anteriorly. The preperitoneal space was probed bluntly with a hemostat until the peritoneum was opened and access was gained to the abdominal cavity. A 0 Vicryl purse string stitch was placed in the fascia surrounding the opening. A Hassan cannula was placed through the opening and anchored in place with the previously placed Vicryl purse string stitch. The laparoscope was placed through the [facility] cannula. The abdomen was insufflated with carbon dioxide without difficulty. Next the suprapubic area was infiltrated with quarter percent Marcaine. A small incision was made with a 15 blade  knife. A 5 mm port was placed bluntly through this incision into the abdominal cavity. A site was then chosen between the 2 port for placement of a 5 mm port. The area was infiltrated with quarter percent Marcaine. A small stab incision was made with a 15 blade knife. A 5 mm port was placed bluntly through this incision and the abdominal cavity under direct vision. The laparoscope was then moved to the suprapubic port. Using a Glassman grasper and harmonic scalpel the right lower quadrant was inspected. The appendix was readily identified. There was a large mass of the appendix but the base of the appendix where it joined the cecum appeared normal. The appendix was elevated anteriorly and the mesoappendix was taken down sharply with the harmonic scalpel. Once the base of the appendix where it joined the cecum was identified and cleared of any tissue then a laparoscopic GIA blue load 6 row stapler was placed through the [facility] cannula. The stapler was placed across the base of the appendix clamped and fired thereby dividing the base of the appendix between staple lines. A laparoscopic bag was then inserted through the [facility] cannula. The appendix was placed within the bag and the bag was sealed. The abdomen was then irrigated with copious amounts of saline until the effluent was clear. No other abnormalities were noted. The appendix and bag were removed with the [facility] cannula through the infraumbilical port without difficulty. No other abnormalities were identified on 4 quadrant inspection of the abdomen. The fascial defect was closed with the previously placed Vicryl pursestring stitch as well as with another interrupted  0 Vicryl figure-of-eight stitch. The rest of the ports were removed under direct vision and were found to be hemostatic. The gas was allowed to escape. The skin incisions were closed with interrupted 4-0 Monocryl subcuticular stitches. Dermabond dressings were applied. The patient tolerated the  procedure well. At the end of the case all needle sponge and instrument counts were correct. The patient was then awakened and taken to recovery in stable condition.  PLAN OF CARE: Admit for overnight observation  PATIENT DISPOSITION:  PACU - hemodynamically stable.   Delay start of Pharmacological VTE agent (>24hrs) due to surgical blood loss or risk of bleeding: no

## 2017-01-06 NOTE — Anesthesia Procedure Notes (Addendum)
Procedure Name: Intubation Date/Time: 01/06/2017 8:45 AM Performed by: Marchelle Folks Mekhi Pre-anesthesia Checklist: Patient identified, Emergency Drugs available, Suction available, Patient being monitored and Timeout performed Patient Re-evaluated:Patient Re-evaluated prior to inductionOxygen Delivery Method: Circle system utilized Preoxygenation: Pre-oxygenation with 100% oxygen Intubation Type: IV induction Ventilation: Mask ventilation without difficulty Laryngoscope Size: Mac and 3 Grade View: Grade II Tube type: Oral Tube size: 7.0 mm Number of attempts: 1 Airway Equipment and Method: Stylet Placement Confirmation: ETT inserted through vocal cords under direct vision,  positive ETCO2,  CO2 detector and breath sounds checked- equal and bilateral Secured at: 21 cm Tube secured with: Tape Dental Injury: Teeth and Oropharynx as per pre-operative assessment

## 2017-01-06 NOTE — Anesthesia Postprocedure Evaluation (Addendum)
Anesthesia Post Note  Patient: Rhonda [name]  Procedure(s) Performed: Procedure(s) (LRB): LAPAROSCOPIC APPENDECTOMY (N/A)  Patient location during evaluation: PACU Anesthesia Type: General Level of consciousness: awake and oriented Pain management: pain level controlled Vital Signs Assessment: post-procedure vital signs reviewed and stable Respiratory status: spontaneous breathing, nonlabored ventilation, respiratory function stable and patient connected to nasal cannula oxygen Cardiovascular status: blood pressure returned to baseline and stable Postop Assessment: no signs of nausea or vomiting Anesthetic complications: no       Last Vitals:  Vitals:   01/06/17 1130 01/06/17 1131  BP:  (!) 114/55  Pulse: 85 80  Resp: 18 15  Temp:      Last Pain:  Vitals:   01/06/17 1126  TempSrc:   PainSc: 3                  Daekwon Beswick,JAMES TERRILL

## 2017-01-06 NOTE — Transfer of Care (Signed)
Immediate Anesthesia Transfer of Care Note  Patient: Rhonda [name]  Procedure(s) Performed: Procedure(s): LAPAROSCOPIC APPENDECTOMY (N/A)  Patient Location: PACU  Anesthesia Type:General  Level of Consciousness: awake, alert  and oriented  Airway & Oxygen Therapy: Patient Spontanous Breathing and Patient connected to nasal cannula oxygen  Post-op Assessment: Report given to RN, Post -op Vital signs reviewed and stable and Patient moving all extremities X 4  Post vital signs: Reviewed and stable  Last Vitals:  Vitals:   01/06/17 0646  BP: (!) 157/60  Pulse: 77  Resp: 18  Temp: 36.6 C    Last Pain:  Vitals:   01/06/17 0712  TempSrc:   PainSc: 2       Patients Stated Pain Goal: 2 ([id number])  Complications: No apparent anesthesia complications

## 2017-01-06 NOTE — Interval H&P Note (Signed)
History and Physical Interval Note:  01/06/2017 8:27 AM  Rhonda [name]  has presented today for surgery, with the diagnosis of MASS OF APPENDIX  The various methods of treatment have been discussed with the patient and family. After consideration of risks, benefits and other options for treatment, the patient has consented to  Procedure(s): LAPAROSCOPIC APPENDECTOMY (N/A) as a surgical intervention .  The patient's history has been reviewed, patient examined, no change in status, stable for surgery.  I have reviewed the patient's chart and labs.  Questions were answered to the patient's satisfaction.     TOTH III,Debbie Bellucci S

## 2017-01-06 NOTE — Anesthesia Preprocedure Evaluation (Addendum)
Anesthesia Evaluation  Patient identified by MRN, date of birth, ID band Patient awake    Reviewed: Allergy & Precautions, NPO status , Patient's Chart, lab work & pertinent test results  Airway Mallampati: I  TM Distance: >3 FB Neck ROM: Full    Dental  (+) Teeth Intact   Pulmonary shortness of breath,    breath sounds clear to auscultation       Cardiovascular hypertension,  Rhythm:Regular Rate:Normal     Neuro/Psych    GI/Hepatic Neg liver ROS, GERD  ,  Endo/Other  negative endocrine ROS  Renal/GU negative Renal ROS     Musculoskeletal  (+) Arthritis ,   Abdominal   Peds  Hematology negative hematology ROS (+)   Anesthesia Other Findings   Reproductive/Obstetrics                            Anesthesia Physical Anesthesia Plan  ASA: II  Anesthesia Plan: General   Post-op Pain Management:    Induction: Intravenous  Airway Management Planned: Oral ETT  Additional Equipment:   Intra-op Plan:   Post-operative Plan: Extubation in OR  Informed Consent: I have reviewed the patients History and Physical, chart, labs and discussed the procedure including the risks, benefits and alternatives for the proposed anesthesia with the patient or authorized representative who has indicated his/her understanding and acceptance.   Dental advisory given  Plan Discussed with: CRNA  Anesthesia Plan Comments:         Anesthesia Quick Evaluation

## 2017-01-07 ENCOUNTER — Encounter (HOSPITAL_COMMUNITY): Payer: Self-pay | Admitting: General Surgery

## 2017-01-07 DIAGNOSIS — D490 Neoplasm of unspecified behavior of digestive system: Secondary | ICD-10-CM | POA: Diagnosis not present

## 2017-01-07 MED ORDER — HYDROCODONE-ACETAMINOPHEN 5-325 MG PO TABS: 1.0000 | ORAL_TABLET | ORAL | 0 refills | Status: DC | PRN

## 2017-01-07 NOTE — Progress Notes (Signed)
1 Day Post-Op  Subjective: Doing well. No complaints other than soreness  Objective: Vital signs in last 24 hours: Temp:  [97.5 F (36.4 C)-98.4 F (36.9 C)] 98.2 F (36.8 C) (01/11 0500) Pulse Rate:  [70-95] 76 (01/11 0500) Resp:  [9-19] 18 (01/11 0500) BP: (75-158)/(46-76) 158/55 (01/11 0500) SpO2:  [88 %-99 %] 98 % (01/11 0500) Weight:  [87.5 kg (193 lb)] 87.5 kg (193 lb) (01/10 0712) Last BM Date: 01/05/17  Intake/Output from previous day: 01/10 0701 - 01/11 0700 In: 2265 [P.O.:1170; I.V.:1095] Out: [id number] [Urine:1360; Blood:25] Intake/Output this shift: No intake/output data recorded.  Resp: clear to auscultation bilaterally Cardio: regular rate and rhythm GI: soft, minimal tenderness  Lab Results:  No results for input([name]): WBC, HGB, HCT, PLT in the last 72 hours. BMET No results for input([name]): NA, K, CL, CO2, GLUCOSE, BUN, CREATININE, CALCIUM in the last 72 hours. PT/INR No results for input([name]): LABPROT, INR in the last 72 hours. ABG No results for input([name]): PHART, HCO3 in the last 72 hours.  Invalid input([name]): PCO2, PO2  Studies/Results: No results found.  Anti-infectives: Anti-infectives    Start     Dose/Rate Route Frequency Ordered Stop   01/06/17 1200  hydroxychloroquine (PLAQUENIL) tablet 200 mg     200 mg Oral 2 times daily 01/06/17 1147     01/06/17 0800  cefoTEtan in Dextrose 5% (CEFOTAN) IVPB 2 g     2 g Intravenous To [facility]Stay [facility] 01/05/17 1445 01/06/17 0848      Assessment/Plan: [name]/p Procedure([name]): LAPAROSCOPIC APPENDECTOMY (N/A) Advance diet Discharge  LOS: 0 days    Rhonda [name],Rhonda [name] 01/07/2017

## 2017-01-07 NOTE — Progress Notes (Signed)
IV removed. Discharge paperwork given to patient. Questions answered. Prescription given to patient's husband. Patient is ready to discharge.

## 2017-01-07 NOTE — Discharge Summary (Signed)
Physician Discharge Summary  Patient ID: Rhonda [name] MRN: [id number] DOB/AGE: 1941/12/17 76 y.o.  Admit date: 01/06/2017 Discharge date: 01/07/2017  Admission Diagnoses:  Discharge Diagnoses:  Active Problems:   Mass of appendix   Discharged Condition: good  Hospital Course: the patient underwent lap appy for an appendiceal mass. She tolerated surgery well. On pod 1 she was ready for discharge home  Consults: None  Significant Diagnostic Studies: none  Treatments: surgery: as above  Discharge Exam: Blood pressure (!) 158/55, pulse 76, temperature 98.2 F (36.8 C), temperature source Oral, resp. rate 18, height 5' 3.5" (1.613 m), weight 87.5 kg (193 lb), SpO2 98 %. Resp: clear to auscultation bilaterally Cardio: regular rate and rhythm GI: soft, minimal tenderness  Disposition: Final discharge disposition not confirmed  Discharge Instructions    Call MD for:  difficulty breathing, headache or visual disturbances    Complete by:  As directed    Call MD for:  extreme fatigue    Complete by:  As directed    Call MD for:  hives    Complete by:  As directed    Call MD for:  persistant dizziness or light-headedness    Complete by:  As directed    Call MD for:  persistant nausea and vomiting    Complete by:  As directed    Call MD for:  redness, tenderness, or signs of infection (pain, swelling, redness, odor or green/yellow discharge around incision site)    Complete by:  As directed    Call MD for:  severe uncontrolled pain    Complete by:  As directed    Call MD for:  temperature >100.4    Complete by:  As directed    Diet - low sodium heart healthy    Complete by:  As directed    Discharge instructions    Complete by:  As directed    May shower. No heavy lifting. Diet as tolerated   Increase activity slowly    Complete by:  As directed    No wound care    Complete by:  As directed      Allergies as of 01/07/2017      Reactions   Aleve [naproxen] Other (See  Comments)   GI upset      Medication List    TAKE these medications   acetaminophen-codeine 300-30 MG tablet Commonly known as:  TYLENOL #3 One every 4 hours as needed for cough   CAL-MAG-ZINC-D PO Take 2 tablets by mouth daily.   cetirizine 10 MG tablet Commonly known as:  ZYRTEC Take 10 mg by mouth daily as needed for allergies. Allertec   estradiol 0.1 MG/GM vaginal cream Commonly known as:  ESTRACE Place 1 Applicatorful vaginally 2 (two) times a week.   famotidine 20 MG tablet Commonly known as:  PEPCID TAKE 1 TABLET BY MOUTH AT BEDTIME.   FLOVENT HFA 220 MCG/ACT inhaler Generic drug:  fluticasone Inhale 1 puff into the lungs 2 (two) times daily.   folic acid Q000111Q MCG tablet Commonly known as:  FOLVITE Take 800 mcg by mouth daily.   HYDROcodone-acetaminophen 5-325 MG tablet Commonly known as:  NORCO/VICODIN Take 1-2 tablets by mouth every 4 (four) hours as needed for moderate pain.   hydroxychloroquine 200 MG tablet Commonly known as:  PLAQUENIL Take 200 mg by mouth 2 (two) times daily.   hydrOXYzine 10 MG tablet Commonly known as:  ATARAX/VISTARIL Take 10 mg by mouth every 6 (six) hours as needed for  itching (hives).   levothyroxine 25 MCG tablet Commonly known as:  SYNTHROID, LEVOTHROID Take 25 mcg by mouth daily before breakfast.   methotrexate 2.5 MG tablet Commonly known as:  RHEUMATREX Take 10 mg by mouth every Friday.   multivitamin with minerals Tabs tablet Take 1 tablet by mouth daily.   olmesartan 20 MG tablet Commonly known as:  BENICAR Take 20 mg by mouth daily before breakfast.   omeprazole 40 MG capsule Commonly known as:  PRILOSEC Take 40 mg by mouth daily.   REFRESH TEARS OP Apply 1 drop to eye daily as needed (dry eyes).   REFRESH LIQUIGEL OP Apply 1 drop to eye at bedtime.   simvastatin 40 MG tablet Commonly known as:  ZOCOR Take 40 mg by mouth every evening.   Vitamin D 2000 units Caps Take 4,000 Units by mouth daily.       Follow-up Information    TOTH III,Srah Ake S, MD Follow up in 2 week(s).   Specialty:  General Surgery Contact information: [address] [place] [address] [phone]           Signed: Merrie Roof 01/07/2017, 7:33 AM

## 2017-02-04 ENCOUNTER — Other Ambulatory Visit: Payer: Self-pay | Admitting: Internal Medicine

## 2017-02-04 DIAGNOSIS — Z1231 Encounter for screening mammogram for malignant neoplasm of breast: Secondary | ICD-10-CM

## 2017-02-24 ENCOUNTER — Ambulatory Visit
Admission: RE | Admit: 2017-02-24 | Discharge: 2017-02-24 | Disposition: A | Discharge status: Home / Self Care | Payer: Medicare Other | Source: Ambulatory Visit | Attending: Internal Medicine | Admitting: Internal Medicine

## 2017-02-24 DIAGNOSIS — Z1231 Encounter for screening mammogram for malignant neoplasm of breast: Secondary | ICD-10-CM

## 2017-05-28 NOTE — Addendum Note (Signed)
Addendum  created 05/28/17 0859 by Rica Koyanagi, MD   Sign clinical note

## 2017-05-28 NOTE — Addendum Note (Signed)
Addendum  created 05/28/17 0908 by Rica Koyanagi, MD   Sign clinical note

## 2017-08-19 ENCOUNTER — Other Ambulatory Visit: Payer: Self-pay | Admitting: Obstetrics & Gynecology

## 2018-01-04 ENCOUNTER — Other Ambulatory Visit: Payer: Self-pay | Admitting: General Surgery

## 2018-01-04 DIAGNOSIS — D373 Neoplasm of uncertain behavior of appendix: Secondary | ICD-10-CM

## 2018-01-27 ENCOUNTER — Ambulatory Visit
Admission: RE | Admit: 2018-01-27 | Discharge: 2018-01-27 | Disposition: A | Discharge status: Home / Self Care | Payer: Medicare Other | Source: Ambulatory Visit | Attending: General Surgery | Admitting: General Surgery

## 2018-01-27 DIAGNOSIS — D373 Neoplasm of uncertain behavior of appendix: Secondary | ICD-10-CM

## 2018-02-15 ENCOUNTER — Other Ambulatory Visit: Payer: Self-pay | Admitting: Internal Medicine

## 2018-02-15 DIAGNOSIS — Z1231 Encounter for screening mammogram for malignant neoplasm of breast: Secondary | ICD-10-CM

## 2018-03-07 ENCOUNTER — Ambulatory Visit
Admission: RE | Admit: 2018-03-07 | Discharge: 2018-03-07 | Disposition: A | Discharge status: Home / Self Care | Payer: Medicare Other | Source: Ambulatory Visit | Attending: Internal Medicine | Admitting: Internal Medicine

## 2018-03-07 DIAGNOSIS — Z1231 Encounter for screening mammogram for malignant neoplasm of breast: Secondary | ICD-10-CM

## 2018-06-14 ENCOUNTER — Other Ambulatory Visit: Payer: Self-pay | Admitting: Geriatric Medicine

## 2018-06-14 DIAGNOSIS — R1030 Lower abdominal pain, unspecified: Secondary | ICD-10-CM

## 2018-06-23 ENCOUNTER — Other Ambulatory Visit: Payer: Medicare Other

## 2018-06-28 ENCOUNTER — Ambulatory Visit
Admission: RE | Admit: 2018-06-28 | Discharge: 2018-06-28 | Disposition: A | Discharge status: Home / Self Care | Payer: Medicare Other | Source: Ambulatory Visit | Attending: Geriatric Medicine | Admitting: Geriatric Medicine

## 2018-06-28 DIAGNOSIS — R1030 Lower abdominal pain, unspecified: Secondary | ICD-10-CM

## 2018-06-28 IMAGING — CT CT ABD-PELV W/ CM
1 series · 14 of 32 positions shown, 18 images · IV contrast (APPLIED)
Comparison: 08/18/2016 CT.

CLINICAL DATA: 77-year-old female with lump at navel. History of
appendectomy. Pain left lower quadrant. Constipation. Initial
encounter.

EXAM:
CT ABDOMEN AND PELVIS WITH CONTRAST
TECHNIQUE: Multidetector CT imaging of the abdomen and pelvis was performed
using the standard protocol following bolus administration of
intravenous contrast.
CONTRAST:  100mL 3JM2F6-SZZ IOPAMIDOL (3JM2F6-SZZ) INJECTION 61%

[Series 7: cor · coronal · 0.93mm/px · 14 of 117 slices shown, 18 images]
[im 4/117  lung]
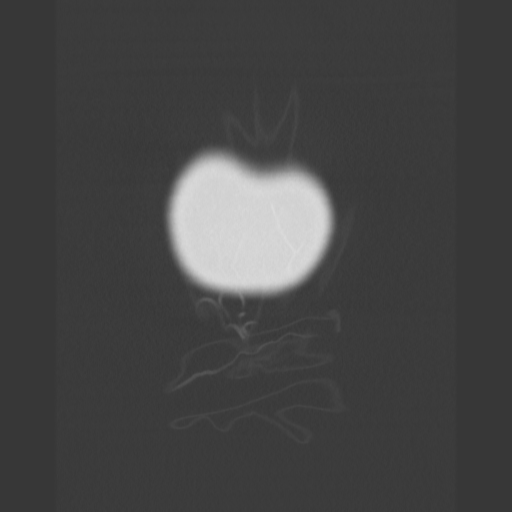
[im 8/117  soft-tissue]
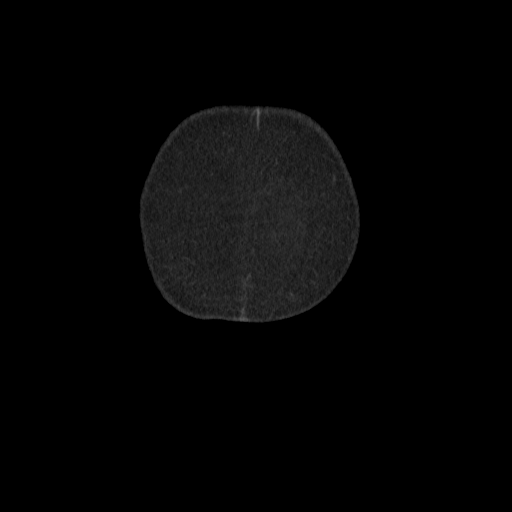
[im 8/117  lung]
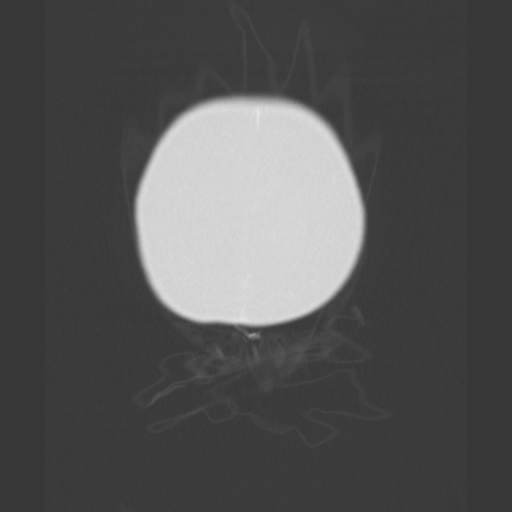
[im 8/117  bone]
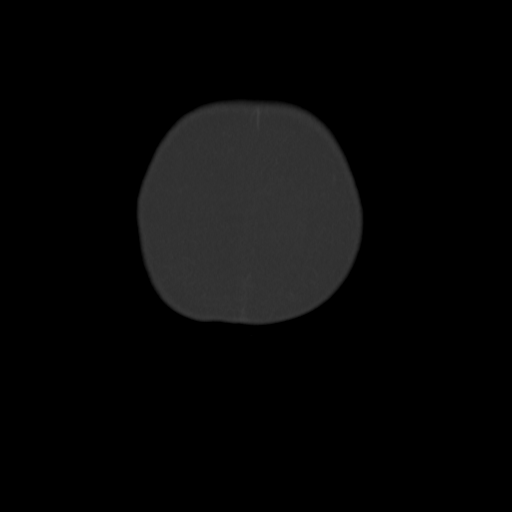
[im 12/117  lung]
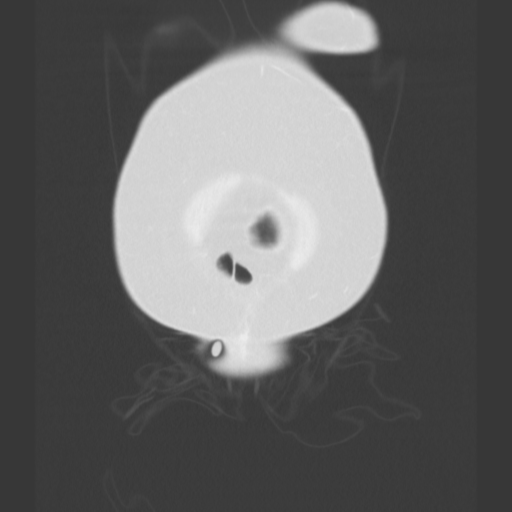
[im 15/117  soft-tissue]
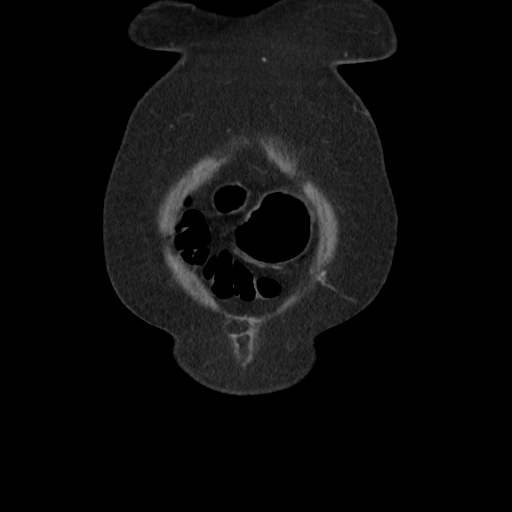
[im 15/117  lung]
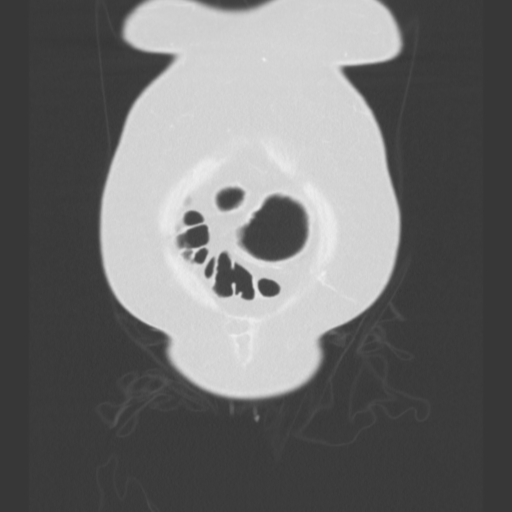
[im 27/117  soft-tissue]
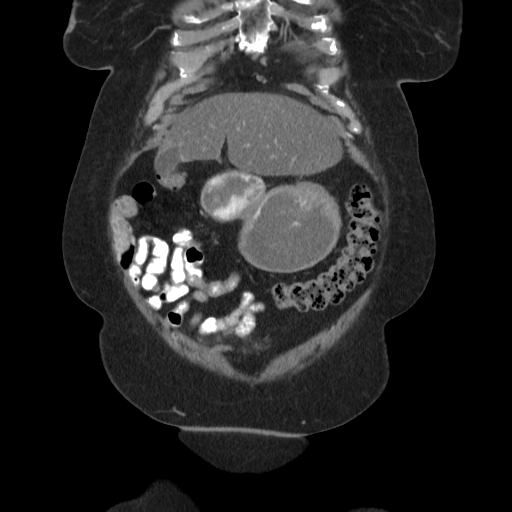
[im 34/117  soft-tissue]
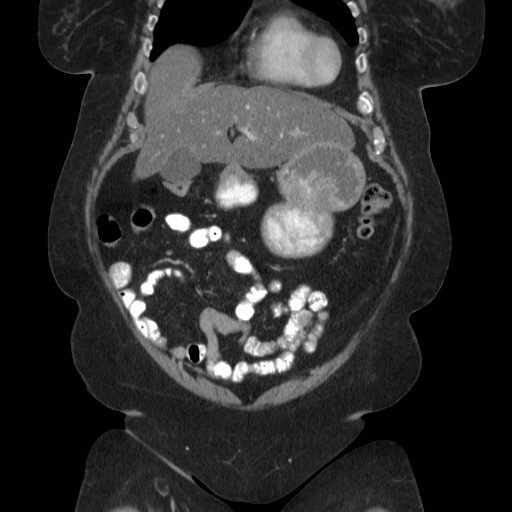
[im 45/117  soft-tissue]
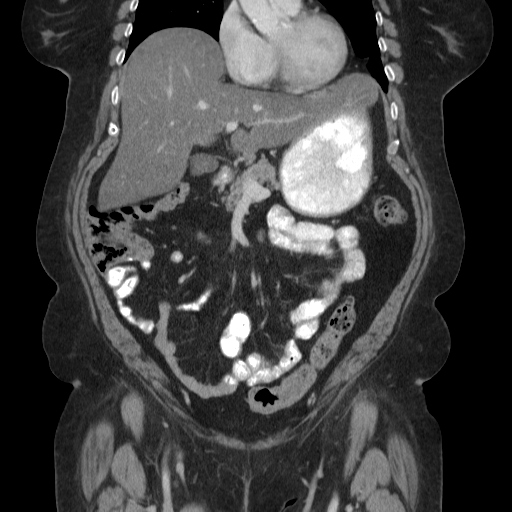
[im 53/117  soft-tissue]
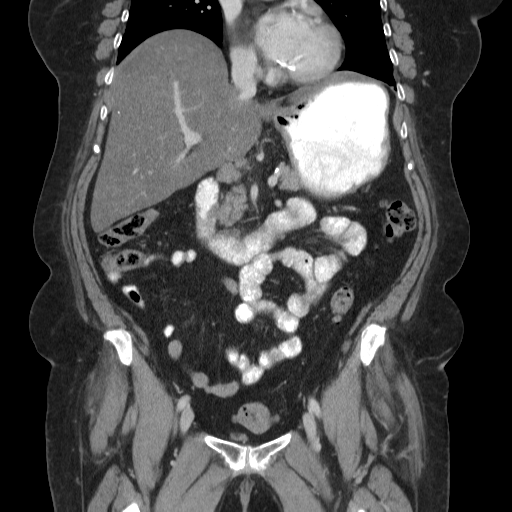
[im 64/117  soft-tissue]
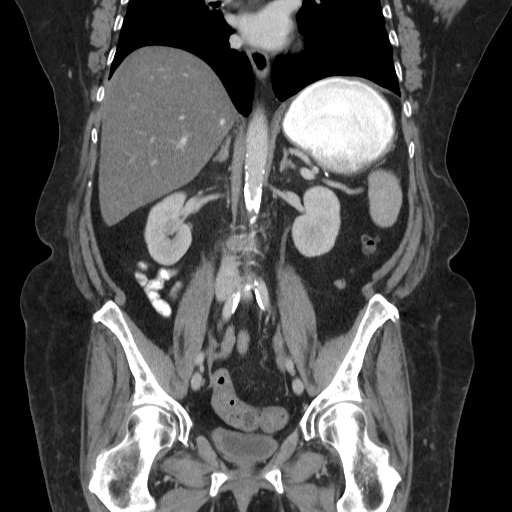
[im 72/117  soft-tissue]
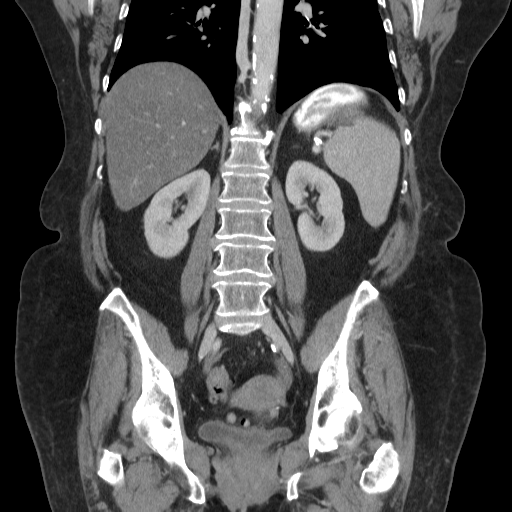
[im 83/117  soft-tissue]
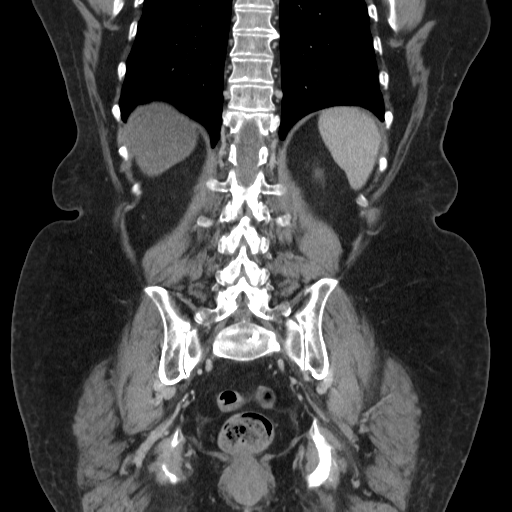
[im 83/117  bone]
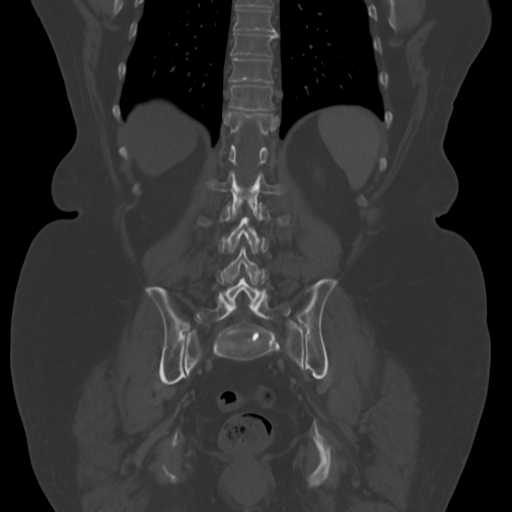
[im 90/117  soft-tissue]
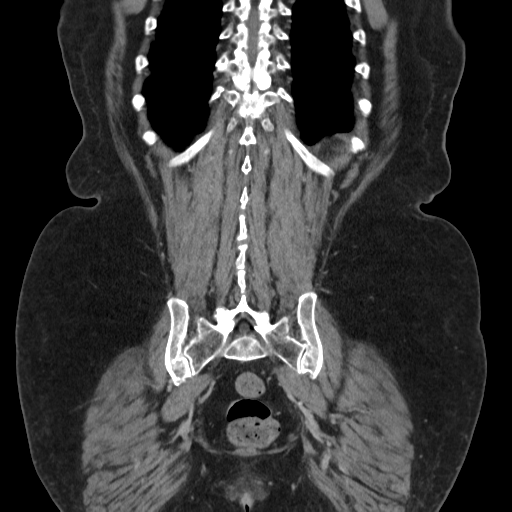
[im 102/117  soft-tissue]
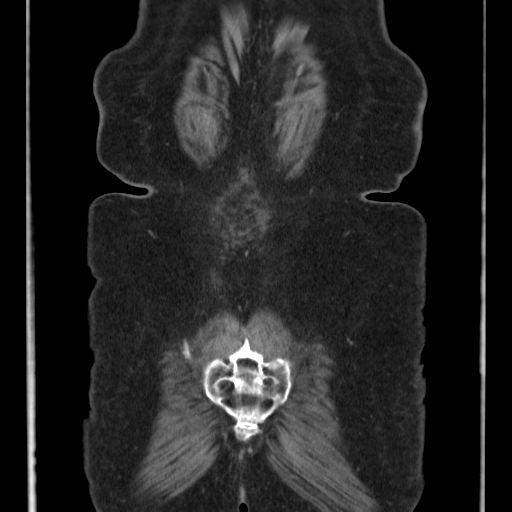
[im 109/117  soft-tissue]
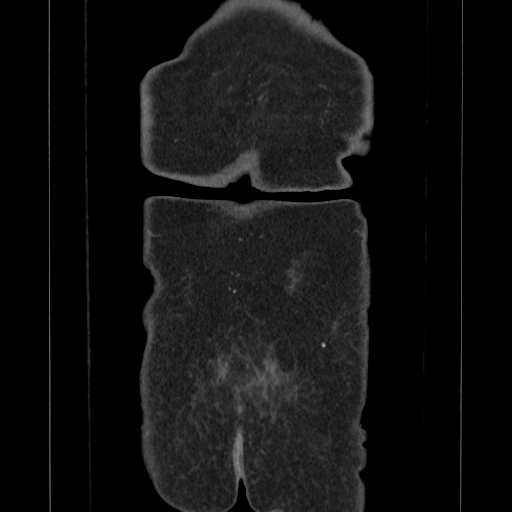

[14 of 32 positions shown; findings below may reference images not displayed]

FINDINGS: Lower chest: Minimal scarring lung bases. Heart size within normal
limits. Aortic and mitral valve calcifications. Coronary artery
calcifications.

Hepatobiliary: Enlarged fatty liver spanning over 18.7 cm. This
contributes to mild elevation right hemidiaphragm. 2 tiny
calcifications within the liver. No worrisome hepatic lesion.

No calcified gallstone. Phrygian cap incidentally noted. Focal fatty
sparing of liver adjacent to this region.

Pancreas: No pancreatic mass or inflammation.

Spleen: No splenic mass.  Top-normal length spleen.

Adrenals/Urinary Tract: No obstructing stone or hydronephrosis. No
worrisome renal or adrenal mass.

Contracted noncontrast filled urinary bladder with circumferential
wall thickening without focal polypoid lesion noted.

Stomach/Bowel: Diverticulosis most notable sigmoid colon with
associated muscular hypertrophy without findings of diverticulitis.

Post appendectomy.  No extraluminal bowel inflammatory process.

Vascular/Lymphatic: Moderate atherosclerotic plaque aorta and aortic
branch vessels. No abdominal aortic aneurysm or large vessel
occlusion.

Scattered normal size lymph nodes without adenopathy.

Reproductive: No worrisome adnexal or uterine mass.

Other: No free air.

Periumbilical hernia (4 x 3.8 cm rent) through which herniate fat
and vessels which appear slightly inflamed. Contents of hernia span
over 6.8 x 4 x 3 cm. 1 cm calcification contained within hernia. 4
mm calcification adjacent to hernia. Significance of calcifications
indeterminate.

Musculoskeletal: Mild degenerative changes lower thoracic and lumbar
spine. Sclerotic appearance femoral heads without significant
change. Cannot exclude avascular necrosis however no evidence of
subchondral lucency or collapse.
IMPRESSION: Periumbilical hernia contains fat and vessels which appear slightly
inflamed. Contents of hernia span over 6.8 x 4 x 3 cm. One cm
calcification contained within hernia. 4 mm calcification adjacent
to hernia. Etiology/significance of calcifications indeterminate.

Sigmoid diverticulosis with muscular hypertrophy without evidence of
diverticulitis.

Enlarged fatty liver.

Aortic Atherosclerosis ([id number].R).

Coronary artery calcifications.

Sclerotic femoral heads without evidence of collapse as can be seen
with avascular necrosis.

## 2018-06-28 MED ORDER — IOPAMIDOL (ISOVUE-300) INJECTION 61%
100.0000 mL | Freq: Once | INTRAVENOUS | Status: AC | PRN
  Administered 2018-06-28: 100 mL via INTRAVENOUS

## 2018-07-01 ENCOUNTER — Other Ambulatory Visit: Payer: Medicare Other

## 2018-07-28 ENCOUNTER — Ambulatory Visit: Payer: Self-pay | Admitting: General Surgery

## 2018-07-29 ENCOUNTER — Ambulatory Visit: Payer: Self-pay | Admitting: General Surgery

## 2018-08-30 NOTE — Pre-Procedure Instructions (Signed)
Jariyah Hackley Sokolow  08/30/2018      [id number] [place], [place] - [place] AT [facility] [address] [place] [address] Phone: [phone] Fax: [place], [place] [facility] [address] [place] [address] [place] [address] Phone: [phone] Fax: [phone]    Your procedure is scheduled on September 16th.  Report to [facility] Admitting at [place].M.  Call this number if you have problems the morning of surgery:  [phone]   Remember:  Do not eat or drink after midnight.      Take these medicines the morning of surgery with A SIP OF WATER   Plaquenil  Hydroxizine (if needed)  Synthroid  Prilosec  Paxil  7 days prior to surgery STOP taking any Aspirin(unless otherwise instructed by your surgeon), Aleve, Naproxen, Ibuprofen, Motrin, Advil, Goody's, BC's, all herbal medications, fish oil, and all vitamins       Do not wear jewelry, make-up or nail polish.  Do not wear lotions, powders, or perfumes, or deodorant.  Do not shave 48 hours prior to surgery.  Men may shave face and neck.  Do not bring valuables to the hospital.  [facility] is not responsible for any belongings or valuables.  Contacts, dentures or bridgework may not be worn into surgery.  Leave your suitcase in the car.  After surgery it may be brought to your room.  For patients admitted to the hospital, discharge time will be determined by your treatment team.  Patients discharged the day of surgery will not be allowed to drive home.    [place]- Preparing For Surgery  Before surgery, you can play an important role. Because skin is not sterile, your skin needs to be as free of germs as possible. You can reduce the number of germs on your skin by washing with CHG (chlorahexidine gluconate) Soap before surgery.  CHG is an antiseptic cleaner which kills germs and bonds with the skin to continue  killing germs even after washing.    Oral Hygiene is also important to reduce your risk of infection.  Remember - BRUSH YOUR TEETH THE MORNING OF SURGERY WITH YOUR REGULAR TOOTHPASTE  Please do not use if you have an allergy to CHG or antibacterial soaps. If your skin becomes reddened/irritated stop using the CHG.  Do not shave (including legs and underarms) for at least 48 hours prior to first CHG shower. It is OK to shave your face.  Please follow these instructions carefully.   1. Shower the NIGHT BEFORE SURGERY and the MORNING OF SURGERY with CHG.   2. If you chose to wash your hair, wash your hair first as usual with your normal shampoo.  3. After you shampoo, rinse your hair and body thoroughly to remove the shampoo.  4. Use CHG as you would any other liquid soap. You can apply CHG directly to the skin and wash gently with a scrungie or a clean washcloth.   5. Apply the CHG Soap to your body ONLY FROM THE NECK DOWN.  Do not use on open wounds or open sores. Avoid contact with your eyes, ears, mouth and genitals (private parts). Wash Face and genitals (private parts)  with your normal soap.  6. Wash thoroughly, paying special attention to the area where your surgery will be performed.  7. Thoroughly rinse your body with warm water from  the neck down.  8. DO NOT shower/wash with your normal soap after using and rinsing off the CHG Soap.  9. Pat yourself dry with a CLEAN TOWEL.  10. Wear CLEAN PAJAMAS to bed the night before surgery, wear comfortable clothes the morning of surgery  11. Place CLEAN SHEETS on your bed the night of your first shower and DO NOT SLEEP WITH PETS.    Day of Surgery:  Do not apply any deodorants/lotions.  Please wear clean clothes to the hospital/surgery center.   Remember to brush your teeth WITH YOUR REGULAR TOOTHPASTE.    Please read over the following fact sheets that you were given.

## 2018-08-31 ENCOUNTER — Encounter (HOSPITAL_COMMUNITY): Payer: Self-pay

## 2018-08-31 ENCOUNTER — Encounter (HOSPITAL_COMMUNITY)
Admission: RE | Admit: 2018-08-31 | Discharge: 2018-08-31 | Disposition: A | Discharge status: Home / Self Care | Payer: Medicare Other | Source: Ambulatory Visit | Attending: General Surgery | Admitting: General Surgery

## 2018-08-31 DIAGNOSIS — Z01810 Encounter for preprocedural cardiovascular examination: Secondary | ICD-10-CM | POA: Diagnosis not present

## 2018-08-31 DIAGNOSIS — Z01812 Encounter for preprocedural laboratory examination: Secondary | ICD-10-CM | POA: Diagnosis not present

## 2018-08-31 DIAGNOSIS — I10 Essential (primary) hypertension: Secondary | ICD-10-CM | POA: Diagnosis not present

## 2018-08-31 LAB — BASIC METABOLIC PANEL
Anion gap: 10 (ref 5–15)
BUN: 16 mg/dL (ref 8–23)
CHLORIDE: 99 mmol/L (ref 98–111)
CO2: 26 mmol/L (ref 22–32)
Calcium: 9.4 mg/dL (ref 8.9–10.3)
Creatinine, Ser: 0.94 mg/dL (ref 0.44–1.00)
GFR calc Af Amer: >60 mL/min (ref >60)
GFR calc non Af Amer: 57 mL/min — ABNORMAL LOW (ref >60)
GLUCOSE: 106 mg/dL — AB (ref 70–99)
POTASSIUM: 4.3 mmol/L (ref 3.5–5.1)
Sodium: 135 mmol/L (ref 135–145)

## 2018-08-31 LAB — CBC
HEMATOCRIT: 37.8 % (ref 36.0–46.0)
HEMOGLOBIN: 12.4 g/dL (ref 12.0–15.0)
MCH: 33.2 pg (ref 26.0–34.0)
MCHC: 32.8 g/dL (ref 30.0–36.0)
MCV: 101.1 fL — AB (ref 78.0–100.0)
Platelets: 185 10*3/uL (ref 150–400)
RBC: 3.74 MIL/uL — AB (ref 3.87–5.11)
RDW: 12.4 % (ref 11.5–15.5)
WBC: 5.8 10*3/uL (ref 4.0–10.5)

## 2018-08-31 MED ORDER — CHLORHEXIDINE GLUCONATE CLOTH 2 % EX PADS: 6.0000 | MEDICATED_PAD | Freq: Once | CUTANEOUS | Status: DC

## 2018-09-12 ENCOUNTER — Ambulatory Visit (HOSPITAL_COMMUNITY): Payer: Medicare Other | Admitting: Certified Registered"

## 2018-09-12 ENCOUNTER — Encounter (HOSPITAL_COMMUNITY)
Admission: RE | Disposition: A | Discharge status: Home / Self Care | Payer: Self-pay | Source: Ambulatory Visit | Attending: General Surgery

## 2018-09-12 ENCOUNTER — Ambulatory Visit (HOSPITAL_COMMUNITY)
Admission: RE | Admit: 2018-09-12 | Discharge: 2018-09-12 | Disposition: A | Discharge status: Home / Self Care | Payer: Medicare Other | Source: Ambulatory Visit | Attending: General Surgery | Admitting: General Surgery

## 2018-09-12 ENCOUNTER — Encounter (HOSPITAL_COMMUNITY): Payer: Self-pay | Admitting: Certified Registered"

## 2018-09-12 ENCOUNTER — Other Ambulatory Visit: Payer: Self-pay

## 2018-09-12 DIAGNOSIS — Z79890 Hormone replacement therapy (postmenopausal): Secondary | ICD-10-CM | POA: Diagnosis not present

## 2018-09-12 DIAGNOSIS — Z79899 Other long term (current) drug therapy: Secondary | ICD-10-CM | POA: Diagnosis not present

## 2018-09-12 DIAGNOSIS — Z7952 Long term (current) use of systemic steroids: Secondary | ICD-10-CM | POA: Insufficient documentation

## 2018-09-12 DIAGNOSIS — K439 Ventral hernia without obstruction or gangrene: Secondary | ICD-10-CM | POA: Diagnosis present

## 2018-09-12 DIAGNOSIS — K660 Peritoneal adhesions (postprocedural) (postinfection): Secondary | ICD-10-CM | POA: Diagnosis not present

## 2018-09-12 HISTORY — PX: VENTRAL HERNIA REPAIR: SHX424

## 2018-09-12 HISTORY — PX: INSERTION OF MESH: SHX5868

## 2018-09-12 SURGERY — REPAIR, HERNIA, VENTRAL, LAPAROSCOPIC
Anesthesia: General | Site: Abdomen

## 2018-09-12 MED ORDER — DEXAMETHASONE SODIUM PHOSPHATE 10 MG/ML IJ SOLN
INTRAMUSCULAR | Status: DC | PRN
  Administered 2018-09-12: 10 mg via INTRAVENOUS

## 2018-09-12 MED ORDER — ONDANSETRON HCL 4 MG/2ML IJ SOLN
INTRAMUSCULAR | Status: AC
  Filled 2018-09-12: qty 2

## 2018-09-12 MED ORDER — ROCURONIUM BROMIDE 50 MG/5ML IV SOSY
PREFILLED_SYRINGE | INTRAVENOUS | Status: AC
  Filled 2018-09-12: qty 5

## 2018-09-12 MED ORDER — ACETAMINOPHEN 500 MG PO TABS
ORAL_TABLET | ORAL | Status: AC
  Filled 2018-09-12: qty 2

## 2018-09-12 MED ORDER — ROCURONIUM BROMIDE 50 MG/5ML IV SOSY
PREFILLED_SYRINGE | INTRAVENOUS | Status: AC
  Filled 2018-09-12: qty 10

## 2018-09-12 MED ORDER — OXYCODONE HCL 5 MG PO TABS
ORAL_TABLET | ORAL | Status: AC
  Administered 2018-09-12: 5 mg via ORAL
  Filled 2018-09-12: qty 1

## 2018-09-12 MED ORDER — BUPIVACAINE-EPINEPHRINE 0.25% -1:200000 IJ SOLN
INTRAMUSCULAR | Status: DC | PRN
  Administered 2018-09-12: 17 mL

## 2018-09-12 MED ORDER — 0.9 % SODIUM CHLORIDE (POUR BTL) OPTIME
TOPICAL | Status: DC | PRN
  Administered 2018-09-12: 1000 mL

## 2018-09-12 MED ORDER — PROPOFOL 10 MG/ML IV BOLUS
INTRAVENOUS | Status: AC
  Filled 2018-09-12: qty 20

## 2018-09-12 MED ORDER — CEFAZOLIN SODIUM-DEXTROSE 2-4 GM/100ML-% IV SOLN
INTRAVENOUS | Status: AC
  Filled 2018-09-12: qty 100

## 2018-09-12 MED ORDER — FENTANYL CITRATE (PF) 100 MCG/2ML IJ SOLN
25.0000 ug | INTRAMUSCULAR | Status: DC | PRN
  Administered 2018-09-12: 50 ug via INTRAVENOUS
  Administered 2018-09-12: 25 ug via INTRAVENOUS

## 2018-09-12 MED ORDER — ONDANSETRON HCL 4 MG/2ML IJ SOLN: 4.0000 mg | Freq: Four times a day (QID) | INTRAMUSCULAR | Status: DC | PRN

## 2018-09-12 MED ORDER — GABAPENTIN 300 MG PO CAPS
ORAL_CAPSULE | ORAL | Status: AC
  Filled 2018-09-12: qty 1

## 2018-09-12 MED ORDER — OXYCODONE HCL 5 MG/5ML PO SOLN: 5.0000 mg | Freq: Once | ORAL | Status: AC | PRN

## 2018-09-12 MED ORDER — LIDOCAINE 2% (20 MG/ML) 5 ML SYRINGE
INTRAMUSCULAR | Status: DC | PRN
  Administered 2018-09-12: 50 mg via INTRAVENOUS

## 2018-09-12 MED ORDER — PHENYLEPHRINE 40 MCG/ML (10ML) SYRINGE FOR IV PUSH (FOR BLOOD PRESSURE SUPPORT)
PREFILLED_SYRINGE | INTRAVENOUS | Status: AC
  Filled 2018-09-12: qty 10

## 2018-09-12 MED ORDER — GABAPENTIN 300 MG PO CAPS
300.0000 mg | ORAL_CAPSULE | ORAL | Status: AC
  Administered 2018-09-12: 300 mg via ORAL

## 2018-09-12 MED ORDER — HYDROCODONE-ACETAMINOPHEN 5-325 MG PO TABS: 1.0000 | ORAL_TABLET | Freq: Four times a day (QID) | ORAL | 0 refills | Status: DC | PRN

## 2018-09-12 MED ORDER — FENTANYL CITRATE (PF) 250 MCG/5ML IJ SOLN
INTRAMUSCULAR | Status: DC | PRN
  Administered 2018-09-12: 50 ug via INTRAVENOUS
  Administered 2018-09-12: 100 ug via INTRAVENOUS

## 2018-09-12 MED ORDER — ACETAMINOPHEN 500 MG PO TABS
1000.0000 mg | ORAL_TABLET | ORAL | Status: AC
  Administered 2018-09-12: 1000 mg via ORAL

## 2018-09-12 MED ORDER — FENTANYL CITRATE (PF) 100 MCG/2ML IJ SOLN
INTRAMUSCULAR | Status: AC
  Administered 2018-09-12: 50 ug via INTRAVENOUS
  Filled 2018-09-12: qty 2

## 2018-09-12 MED ORDER — ROCURONIUM BROMIDE 10 MG/ML (PF) SYRINGE
PREFILLED_SYRINGE | INTRAVENOUS | Status: DC | PRN
  Administered 2018-09-12: 20 mg via INTRAVENOUS
  Administered 2018-09-12: 10 mg via INTRAVENOUS
  Administered 2018-09-12: 40 mg via INTRAVENOUS

## 2018-09-12 MED ORDER — ONDANSETRON HCL 4 MG/2ML IJ SOLN
INTRAMUSCULAR | Status: DC | PRN
  Administered 2018-09-12: 4 mg via INTRAVENOUS

## 2018-09-12 MED ORDER — DEXAMETHASONE SODIUM PHOSPHATE 10 MG/ML IJ SOLN
INTRAMUSCULAR | Status: AC
  Filled 2018-09-12: qty 1

## 2018-09-12 MED ORDER — CEFAZOLIN SODIUM-DEXTROSE 2-4 GM/100ML-% IV SOLN
2.0000 g | INTRAVENOUS | Status: AC
  Administered 2018-09-12: 2 g via INTRAVENOUS

## 2018-09-12 MED ORDER — LACTATED RINGERS IV SOLN
INTRAVENOUS | Status: DC
  Administered 2018-09-12: 08:00:00 via INTRAVENOUS

## 2018-09-12 MED ORDER — LIDOCAINE 2% (20 MG/ML) 5 ML SYRINGE
INTRAMUSCULAR | Status: AC
  Filled 2018-09-12: qty 5

## 2018-09-12 MED ORDER — SUGAMMADEX SODIUM 200 MG/2ML IV SOLN
INTRAVENOUS | Status: DC | PRN
  Administered 2018-09-12: 180 mg via INTRAVENOUS

## 2018-09-12 MED ORDER — PROPOFOL 10 MG/ML IV BOLUS
INTRAVENOUS | Status: DC | PRN
  Administered 2018-09-12: 150 mg via INTRAVENOUS

## 2018-09-12 MED ORDER — STERILE WATER FOR IRRIGATION IR SOLN
Status: DC | PRN
  Administered 2018-09-12: 1000 mL

## 2018-09-12 MED ORDER — BUPIVACAINE-EPINEPHRINE (PF) 0.25% -1:200000 IJ SOLN
INTRAMUSCULAR | Status: AC
  Filled 2018-09-12: qty 30

## 2018-09-12 MED ORDER — OXYCODONE HCL 5 MG PO TABS
5.0000 mg | ORAL_TABLET | Freq: Once | ORAL | Status: AC | PRN
  Administered 2018-09-12: 5 mg via ORAL

## 2018-09-12 MED ORDER — FENTANYL CITRATE (PF) 250 MCG/5ML IJ SOLN
INTRAMUSCULAR | Status: AC
  Filled 2018-09-12: qty 5

## 2018-09-12 MED ORDER — PHENYLEPHRINE 40 MCG/ML (10ML) SYRINGE FOR IV PUSH (FOR BLOOD PRESSURE SUPPORT)
PREFILLED_SYRINGE | INTRAVENOUS | Status: DC | PRN
  Administered 2018-09-12 (×2): 80 ug via INTRAVENOUS

## 2018-09-12 SURGICAL SUPPLY — 47 items
ADH SKN CLS APL DERMABOND .7 (GAUZE/BANDAGES/DRESSINGS) ×1
BINDER ABDOMINAL 12 ML 46-62 (SOFTGOODS) ×2 IMPLANT
BNDG GAUZE ELAST 4 BULKY (GAUZE/BANDAGES/DRESSINGS) IMPLANT
CANISTER SUCT 3000ML PPV (MISCELLANEOUS) IMPLANT
CHLORAPREP W/TINT 26ML (MISCELLANEOUS) ×3 IMPLANT
COVER SURGICAL LIGHT HANDLE (MISCELLANEOUS) ×3 IMPLANT
DERMABOND ADVANCED (GAUZE/BANDAGES/DRESSINGS) ×2
DERMABOND ADVANCED .7 DNX12 (GAUZE/BANDAGES/DRESSINGS) ×1 IMPLANT
DEVICE SECURE STRAP 25 ABSORB (INSTRUMENTS) ×5 IMPLANT
DEVICE TROCAR PUNCTURE CLOSURE (ENDOMECHANICALS) ×3 IMPLANT
DRAPE INCISE IOBAN 66X45 STRL (DRAPES) ×3 IMPLANT
DRAPE LAPAROSCOPIC ABDOMINAL (DRAPES) ×1 IMPLANT
ELECT CAUTERY BLADE 6.4 (BLADE) ×3 IMPLANT
ELECT REM PT RETURN 9FT ADLT (ELECTROSURGICAL) ×3
ELECTRODE REM PT RTRN 9FT ADLT (ELECTROSURGICAL) ×1 IMPLANT
GLOVE BIO SURGEON STRL SZ7.5 (GLOVE) ×3 IMPLANT
GOWN STRL REUS W/ TWL LRG LVL3 (GOWN DISPOSABLE) ×3 IMPLANT
GOWN STRL REUS W/TWL LRG LVL3 (GOWN DISPOSABLE) ×9
KIT BASIN OR (CUSTOM PROCEDURE TRAY) ×3 IMPLANT
KIT TURNOVER KIT B (KITS) ×3 IMPLANT
MARKER SKIN DUAL TIP RULER LAB (MISCELLANEOUS) ×3 IMPLANT
MESH VENTRALIGHT ST 4X6IN (Mesh General) ×2 IMPLANT
NDL SPNL 22GX3.5 QUINCKE BK (NEEDLE) ×1 IMPLANT
NEEDLE SPNL 22GX3.5 QUINCKE BK (NEEDLE) ×3 IMPLANT
NS IRRIG 1000ML POUR BTL (IV SOLUTION) ×3 IMPLANT
PAD ARMBOARD 7.5X6 YLW CONV (MISCELLANEOUS) ×6 IMPLANT
PENCIL BUTTON HOLSTER BLD 10FT (ELECTRODE) ×3 IMPLANT
SCISSORS LAP 5X35 DISP (ENDOMECHANICALS) IMPLANT
SET IRRIG TUBING LAPAROSCOPIC (IRRIGATION / IRRIGATOR) IMPLANT
SHEARS HARMONIC ACE PLUS 36CM (ENDOMECHANICALS) ×2 IMPLANT
SLEEVE ENDOPATH XCEL 5M (ENDOMECHANICALS) ×5 IMPLANT
SUT MNCRL AB 4-0 PS2 18 (SUTURE) ×5 IMPLANT
SUT NOVA NAB DX-16 0-1 5-0 T12 (SUTURE) ×7 IMPLANT
SUT VIC AB 3-0 SH 27 (SUTURE) ×3
SUT VIC AB 3-0 SH 27XBRD (SUTURE) ×1 IMPLANT
TOWEL OR 17X24 6PK STRL BLUE (TOWEL DISPOSABLE) ×3 IMPLANT
TOWEL OR 17X26 10 PK STRL BLUE (TOWEL DISPOSABLE) ×1 IMPLANT
TRAY FOLEY CATH SILVER 16FR (SET/KITS/TRAYS/PACK) ×3 IMPLANT
TRAY LAPAROSCOPIC MC (CUSTOM PROCEDURE TRAY) ×3 IMPLANT
TROCAR XCEL BLUNT TIP 100MML (ENDOMECHANICALS) IMPLANT
TROCAR XCEL NON-BLD 11X100MML (ENDOMECHANICALS) IMPLANT
TROCAR XCEL NON-BLD 5MMX100MML (ENDOMECHANICALS) ×3 IMPLANT
TUBE CONNECTING 12'X1/4 (SUCTIONS) ×1
TUBE CONNECTING 12X1/4 (SUCTIONS) ×1 IMPLANT
TUBING INSUFFLATION (TUBING) ×3 IMPLANT
WATER STERILE IRR 1000ML POUR (IV SOLUTION) ×3 IMPLANT
YANKAUER SUCT BULB TIP NO VENT (SUCTIONS) ×2 IMPLANT

## 2018-09-12 NOTE — Anesthesia Preprocedure Evaluation (Signed)
Anesthesia Evaluation  Patient identified by MRN, date of birth, ID band Patient awake    Reviewed: Allergy & Precautions, H&P , NPO status , Patient's Chart, lab work & pertinent test results  Airway Mallampati: II   Neck ROM: full    Dental   Pulmonary shortness of breath,    breath sounds clear to auscultation       Cardiovascular hypertension,  Rhythm:regular Rate:Normal     Neuro/Psych PSYCHIATRIC DISORDERS Anxiety    GI/Hepatic GERD  ,  Endo/Other  obese  Renal/GU      Musculoskeletal  (+) Arthritis ,   Abdominal   Peds  Hematology   Anesthesia Other Findings   Reproductive/Obstetrics                             Anesthesia Physical Anesthesia Plan  ASA: II  Anesthesia Plan: General   Post-op Pain Management:    Induction: Intravenous  PONV Risk Score and Plan: 3 and Ondansetron, Dexamethasone and Treatment may vary due to age or medical condition  Airway Management Planned: Oral ETT  Additional Equipment:   Intra-op Plan:   Post-operative Plan: Extubation in OR  Informed Consent: I have reviewed the patients History and Physical, chart, labs and discussed the procedure including the risks, benefits and alternatives for the proposed anesthesia with the patient or authorized representative who has indicated his/her understanding and acceptance.     Plan Discussed with: CRNA, Anesthesiologist and Surgeon  Anesthesia Plan Comments:         Anesthesia Quick Evaluation

## 2018-09-12 NOTE — Interval H&P Note (Signed)
History and Physical Interval Note:  09/12/2018 8:11 AM  Rhonda [name]  has presented today for surgery, with the diagnosis of VENTRAL HERNIA  The various methods of treatment have been discussed with the patient and family. After consideration of risks, benefits and other options for treatment, the patient has consented to  Procedure(s): [place] (N/A) INSERTION OF MESH (N/A) as a surgical intervention .  The patient's history has been reviewed, patient examined, no change in status, stable for surgery.  I have reviewed the patient's chart and labs.  Questions were answered to the patient's satisfaction.     TOTH III,PAUL S

## 2018-09-12 NOTE — Transfer of Care (Signed)
Immediate Anesthesia Transfer of Care Note  Patient: Rhonda [name]  Procedure(s) Performed: LAPAROSCOPIC VENTRAL HERNIA REPAIR WITH MESH (N/A Abdomen) INSERTION OF MESH (N/A Abdomen)  Patient Location: PACU  Anesthesia Type:General  Level of Consciousness: drowsy  Airway & Oxygen Therapy: Patient Spontanous Breathing and Patient connected to face mask oxygen  Post-op Assessment: Report given to RN and Post -op Vital signs reviewed and stable  Post vital signs: Reviewed and stable  Last Vitals:  Vitals Value Taken Time  BP 122/56 09/12/2018 10:15 AM  Temp    Pulse 88 09/12/2018 10:19 AM  Resp 21 09/12/2018 10:19 AM  SpO2 90 % 09/12/2018 10:19 AM  Vitals shown include unvalidated device data.  Last Pain:  Vitals:   09/12/18 0725  PainSc: 0-No pain      Patients Stated Pain Goal: 4 (36/01/65 8006)  Complications: No apparent anesthesia complications

## 2018-09-12 NOTE — Op Note (Signed)
09/12/2018  10:06 AM  PATIENT:  Rhonda [name]  77 y.o. female  PRE-OPERATIVE DIAGNOSIS:  VENTRAL HERNIA  POST-OPERATIVE DIAGNOSIS:  VENTRAL HERNIA  PROCEDURE:  Procedure(s): LAPAROSCOPIC VENTRAL HERNIA REPAIR WITH MESH (N/A) INSERTION OF MESH (N/A)  SURGEON:  Surgeon(s) and Role:    * Jovita Kussmaul, MD - Primary  PHYSICIAN ASSISTANT:   ASSISTANTS: none   ANESTHESIA:   local and general  EBL:  minimal   BLOOD ADMINISTERED:none  DRAINS: none   LOCAL MEDICATIONS USED:  MARCAINE     SPECIMEN:  Source of Specimen:  hernia sac  DISPOSITION OF SPECIMEN:  PATHOLOGY  COUNTS:  YES  TOURNIQUET:  * No tourniquets in log *  DICTATION: .Dragon Dictation   After informed consent was obtained the patient was brought to the operating room and placed in the supine position on the operating table.  After adequate induction of general anesthesia the patient's abdomen was prepped with ChloraPrep, allowed to dry, and draped in usual sterile manner including the use of an Ioban drape.  An appropriate timeout was performed.  A site was chosen in the left upper quadrant to access the abdominal cavity.  This area was infiltrated with quarter percent Marcaine.  A small stab incision was made with a 15 blade knife.  A 5 mm Optiview port and camera were used to bluntly dissected the layers of the abdominal wall under direct vision until access was gained to the abdominal cavity.  The abdomen was then insufflated with carbon dioxide without difficulty.  The abdomen was inspected and there were some omental adhesions to a hernia defect near the umbilicus.  The rest of the abdomen looked clean.  Another port was placed under direct vision in the left lower quadrant and a third in the right mid abdomen under direct vision.  A harmonic scalpel was then used to take down the omental adhesions.  Once this was accomplished the abdominal wall was clean.  She had several Swiss cheeselike defects in the fascia  around the umbilicus.  The size of the defect was estimated using a ruler and spinal needle.  A 10 x 15 piece of ventral light mesh was chosen.  Four #1 Novafil stitches were placed at equidistant points around the edge of the mesh with the mesh oriented with the coated side towards the bowel.  Next a small incision was made at the umbilicus with a 15 blade knife.  The incision was carried through the skin and subcutaneous tissue sharply with electrocautery until the hernia sac was entered.  The fascial defects were connected and the hernia sac was excised and sent to pathology for evaluation since she had a history of mucinous tumor.  The mesh was then placed through this fascial opening into the abdominal cavity under the correct orientation.  The fascial defect was closed with interrupted #1 Novafil stitches.  The abdomen was then insufflated again.  4 small stab incisions were made at points corresponding to the anchor stitches.  A suture passer was used to bring the tails of each anchor stitch through the abdominal wall and then the each anchor stitch was cinched down and tied.  Once this was accomplished the mesh was observed to be in good apposition to the abdominal wall without any redundancy.  The caps between the stitches were filled in with a secure strap tacker.  Once this was accomplished the mesh was in good position and the hernia seem well repaired.  The area was examined  and found to be hemostatic.  The abdomen was generally inspected again and no other abnormalities were noted.  The gas was then allowed to escape and the mesh was observed to remain in good position.  The ports were removed without difficulty.  The umbilical incision was then closed with a deep layer of running 3-0 Vicryl stitch and the rest of the skin incisions were closed with 4-0 Monocryl subcuticular stitches.  Dermabond dressings were applied.  The patient tolerated the procedure well.  At the end of the case all needle sponge  and instrument counts were correct.  The patient was then awakened and taken to recovery in stable condition.  PLAN OF CARE: Discharge to home after PACU  PATIENT DISPOSITION:  PACU - hemodynamically stable.   Delay start of Pharmacological VTE agent (>24hrs) due to surgical blood loss or risk of bleeding: not applicable

## 2018-09-12 NOTE — H&P (Signed)
Rhonda [name]  Location: [facility] Patient #: [id number] DOB: 1941/11/03 Married / Language: English / Race: White Female   History of Present Illness  The patient is a 77 year old female who presents for a follow-up for Incisional hernia. 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 She tolerated the surgery well. Over the last several months she has been experiencing some bulging near the incision. She denies any fevers or chills. She denies any nausea or vomiting. Her appetite has been good. She does have chronic problems with constipation. She underwent a recent CT scan that showed a fascial defect at her infraumbilical incision with a hernia. There did not appear to be any intestinal involvement.   Allergies Aleve *ANALGESICS - ANTI-INFLAMMATORY*  Lisinopril *ANTIHYPERTENSIVES*  AmLODIPine Besylate *CALCIUM CHANNEL BLOCKERS*  Allergies Reconciled   Medication History ALPRAZolam (0.5MG  Tablet, Oral as needed) Active. Levothyroxine Sodium (25MCG Tablet, Oral) Active. Hydroxychloroquine Sulfate (200MG  Tablet, Oral) Active. Omeprazole (40MG  Capsule DR, Oral) Active. PredniSONE (5MG  Tablet, Oral) Active. Simvastatin (40MG  Tablet, Oral) Active. RaNITidine HCl (150MG  Tablet, Oral) Active. MetroNIDAZOLE (500MG  Tablet, Oral) Active. Ciprofloxacin HCl (500MG  Tablet, Oral) Active. Methotrexate (2.5MG  Tablet, Oral) Active. Medications Reconciled    Review of Systems  General Not Present- Appetite Loss, Chills, Fatigue, Fever, Night Sweats, Weight Gain and Weight Loss. Skin Not Present- Change in Wart/Mole, Dryness, Hives, Jaundice, New Lesions, Non-Healing Wounds, Rash and Ulcer. HEENT Present- Wears glasses/contact lenses. Not Present- Earache, Hearing Loss, Hoarseness, Nose Bleed, Oral Ulcers, Ringing in the Ears, Seasonal Allergies, Sinus Pain, Sore Throat,  Visual Disturbances and Yellow Eyes. Respiratory Not Present- Bloody sputum, Chronic Cough, Difficulty Breathing, Snoring and Wheezing. Breast Not Present- Breast Mass, Breast Pain, Nipple Discharge and Skin Changes. Cardiovascular Not Present- Chest Pain, Difficulty Breathing Lying Down, Leg Cramps, Palpitations, Rapid Heart Rate, Shortness of Breath and Swelling of Extremities. Gastrointestinal Present- Abdominal Pain, Bloating, Constipation and Excessive gas. Not Present- Bloody Stool, Change in Bowel Habits, Chronic diarrhea, Difficulty Swallowing, Gets full quickly at meals, Hemorrhoids, Indigestion, Nausea, Rectal Pain and Vomiting. Female Genitourinary Present- Frequency. Not Present- Nocturia, Painful Urination, Pelvic Pain and Urgency. Musculoskeletal Not Present- Back Pain, Joint Pain, Joint Stiffness, Muscle Pain, Muscle Weakness and Swelling of Extremities. Psychiatric Present- Anxiety. Not Present- Bipolar, Change in Sleep Pattern, Depression, Fearful and Frequent crying. Endocrine Not Present- Cold Intolerance, Excessive Hunger, Hair Changes, Heat Intolerance, Hot flashes and New Diabetes. Hematology Present- Easy Bruising. Not Present- Blood Thinners, Excessive bleeding, Gland problems, HIV and Persistent Infections.  Vitals  Weight: 200.25 lb Height: 63.5in Body Surface Area: 1.95 m Body Mass Index: 34.92 kg/m  Temp.: 98.45F(Oral)  Pulse: 94 (Regular)  BP: 126/80 (Sitting, Left Arm, Standard)       Physical Exam General Mental Status-Alert. General Appearance-Consistent with stated age. Hydration-Well hydrated. Voice-Normal.  Head and Neck Head-normocephalic, atraumatic with no lesions or palpable masses. Trachea-midline. Thyroid Gland Characteristics - normal size and consistency.  Eye Eyeball - Bilateral-Extraocular movements intact. Sclera/Conjunctiva - Bilateral-No scleral icterus.  Chest and Lung Exam Chest and lung exam  reveals -quiet, even and easy respiratory effort with no use of accessory muscles and on auscultation, normal breath sounds, no adventitious sounds and normal vocal resonance. Inspection Chest Wall - Normal. Back - normal.  Cardiovascular Cardiovascular examination reveals -normal heart sounds, regular rate and rhythm with no murmurs and normal pedal pulses bilaterally.  Abdomen Note: The abdomen is soft with  minimal tenderness. There is a faint fullness at the infraumbilical incision.     Assessment & Plan  INCISIONAL HERNIA, WITHOUT OBSTRUCTION OR GANGRENE (K43.2) Impression: The patient appears to have a ventral incisional hernia likely related to her previous laparoscopic appendectomy. Because of the risk of incarceration and strangulation and think she would benefit from having this fixed. She would also like to have this done. I have discussed with her in detail the risks and benefits of the operation as well as some of the technical aspects including the use of mesh and she understands and wishes to proceed. I will plan for a laparoscopic ventral hernia repair with mesh Current Plans Pt Education - Hernia: discussed with patient and provided information.

## 2018-09-12 NOTE — Anesthesia Procedure Notes (Signed)
Procedure Name: Intubation Date/Time: 09/12/2018 8:35 AM Performed by: Imagene Riches, CRNA Pre-anesthesia Checklist: Patient identified, Emergency Drugs available, Suction available and Patient being monitored Patient Re-evaluated:Patient Re-evaluated prior to induction Oxygen Delivery Method: Circle System Utilized Preoxygenation: Pre-oxygenation with 100% oxygen Induction Type: IV induction Ventilation: Mask ventilation without difficulty Laryngoscope Size: Miller and 3 Grade View: Grade I Tube type: Oral Tube size: 7.0 mm Number of attempts: 1 Airway Equipment and Method: Stylet and Oral airway Placement Confirmation: ETT inserted through vocal cords under direct vision,  positive ETCO2 and breath sounds checked- equal and bilateral Secured at: 22 cm Tube secured with: Tape Dental Injury: Teeth and Oropharynx as per pre-operative assessment

## 2018-09-13 ENCOUNTER — Encounter (HOSPITAL_COMMUNITY): Payer: Self-pay | Admitting: General Surgery

## 2018-09-13 NOTE — Anesthesia Postprocedure Evaluation (Signed)
Anesthesia Post Note  Patient: Rhonda [name]  Procedure(s) Performed: LAPAROSCOPIC VENTRAL HERNIA REPAIR WITH MESH (N/A Abdomen) INSERTION OF MESH (N/A Abdomen)     Patient location during evaluation: PACU Anesthesia Type: General Level of consciousness: awake and alert Pain management: pain level controlled Vital Signs Assessment: post-procedure vital signs reviewed and stable Respiratory status: spontaneous breathing, nonlabored ventilation, respiratory function stable and patient connected to nasal cannula oxygen Cardiovascular status: blood pressure returned to baseline and stable Postop Assessment: no apparent nausea or vomiting Anesthetic complications: no    Last Vitals:  Vitals:   09/12/18 1305 09/12/18 1318  BP: 136/73 127/65  Pulse: 90   Resp: 19 18  Temp: (!) 36.4 C   SpO2: 99% 100%    Last Pain:  Vitals:   09/12/18 1318  PainSc: 0-No pain                 Davionne Mastrangelo S

## 2018-09-16 ENCOUNTER — Ambulatory Visit
Admission: RE | Admit: 2018-09-16 | Discharge: 2018-09-16 | Disposition: A | Discharge status: Home / Self Care | Payer: Medicare Other | Source: Ambulatory Visit | Attending: Internal Medicine | Admitting: Internal Medicine

## 2018-09-16 ENCOUNTER — Other Ambulatory Visit: Payer: Self-pay | Admitting: Internal Medicine

## 2018-09-16 DIAGNOSIS — J40 Bronchitis, not specified as acute or chronic: Secondary | ICD-10-CM

## 2018-12-13 ENCOUNTER — Other Ambulatory Visit: Payer: Self-pay | Admitting: General Surgery

## 2018-12-13 DIAGNOSIS — R109 Unspecified abdominal pain: Secondary | ICD-10-CM

## 2018-12-15 ENCOUNTER — Ambulatory Visit
Admission: RE | Admit: 2018-12-15 | Discharge: 2018-12-15 | Disposition: A | Discharge status: Home / Self Care | Payer: Medicare Other | Source: Ambulatory Visit | Attending: General Surgery | Admitting: General Surgery

## 2018-12-15 DIAGNOSIS — R109 Unspecified abdominal pain: Secondary | ICD-10-CM

## 2019-01-19 ENCOUNTER — Other Ambulatory Visit: Payer: Self-pay | Admitting: Internal Medicine

## 2019-01-19 DIAGNOSIS — M8588 Other specified disorders of bone density and structure, other site: Secondary | ICD-10-CM

## 2019-02-20 ENCOUNTER — Other Ambulatory Visit: Payer: Self-pay | Admitting: Internal Medicine

## 2019-02-20 ENCOUNTER — Ambulatory Visit
Admission: RE | Admit: 2019-02-20 | Discharge: 2019-02-20 | Disposition: A | Discharge status: Home / Self Care | Payer: Medicare Other | Source: Ambulatory Visit | Attending: Internal Medicine | Admitting: Internal Medicine

## 2019-02-20 DIAGNOSIS — R0781 Pleurodynia: Secondary | ICD-10-CM

## 2019-02-20 DIAGNOSIS — Z1231 Encounter for screening mammogram for malignant neoplasm of breast: Secondary | ICD-10-CM

## 2019-03-17 ENCOUNTER — Inpatient Hospital Stay: Admission: RE | Admit: 2019-03-17 | Payer: Medicare Other | Source: Ambulatory Visit

## 2019-06-26 ENCOUNTER — Telehealth: Payer: Self-pay | Admitting: Internal Medicine

## 2019-06-26 NOTE — Telephone Encounter (Signed)
Pt is calling back [phone]

## 2019-06-26 NOTE — Telephone Encounter (Signed)
Left message for pt to call make to make appointment for 2nd opinion.

## 2019-06-26 NOTE — Telephone Encounter (Signed)
Spoke with pt and scheduled an appointment.  Nothing further is needed.

## 2019-06-28 ENCOUNTER — Other Ambulatory Visit: Payer: Self-pay

## 2019-06-28 ENCOUNTER — Ambulatory Visit: Payer: Medicare Other | Admitting: Pulmonary Disease

## 2019-06-28 ENCOUNTER — Encounter: Payer: Self-pay | Admitting: Pulmonary Disease

## 2019-06-28 VITALS — BP 130/66 | HR 87 | Temp 97.9°F | Ht 63.5 in | Wt 198.8 lb

## 2019-06-28 DIAGNOSIS — R05 Cough: Secondary | ICD-10-CM | POA: Diagnosis not present

## 2019-06-28 DIAGNOSIS — R058 Other specified cough: Secondary | ICD-10-CM

## 2019-06-28 NOTE — Patient Instructions (Signed)
Follow-up in 4 weeks

## 2019-06-28 NOTE — Progress Notes (Signed)
[facility]  Chief Complaint  Patient presents with  . Consult    chronic cough, second opinion from Dr Donneta Romberg, productive cough, clear phlegm    Constitutional:  BP 130/66 (BP Location: Right Arm, Cuff Size: Normal)   Pulse 87   Temp 97.9 F (36.6 C) (Oral)   Ht 5' 3.5" (1.613 m)   Wt 198 lb 12.8 oz (90.2 kg)   SpO2 98%   BMI 34.66 kg/m   Past Medical History:  HTN, GERD, OA, RA, Anxiety, Hives  Brief Summary:  Rhonda [name] is a 78 y.o. female with chronic cough.  She was seen in 2017 by Dr. Melvyn Novas.  Concern was for reflux contributing to upper airway cough.  She typically gets a cough in the winter time.  This is especially so when she goes back to [place].  She was in [place] earlier this year and developed a cough again.  She was getting post nasal drip, throat irritation, globus, and would bring up clear sputum.  This occurred more at night when she laid flat.    She was not having fever, hemoptysis, chest pain, dyspnea, or wheezing.   She takes prilosec in the morning and doesn't feel like her heartburn is an issue at present.  She was seen by Dr. Mosetta Anis with allergy/immunology.  She reports allergy testing was negative.  She was started on cetirizine, fluticasone nasal spray, and montelukast two days ago.  She feels these are helping, and her cough is improving.  With this her sleep quality has improved also.  She had episode of getting hives a few years ago while in [place].  Now she gets intermittent episodes of skin itching.  No food allergies.  She has an allergy listed to naproxen, but doesn't recall what type of reaction she had.  She is followed by Dr. Gavin Pound with rheumatology for rheumatoid arthritis.  Rt sided rib xray from 02/20/19 (reviewed by me) was normal except for evidence of atherosclerosis.  Physical Exam:   Appearance - well kempt   ENMT - clear nasal mucosa, midline nasal  septum, no oral  exudates, no LAN, trachea midline  Respiratory - normal chest wall, normal respiratory effort, no accessory muscle use, no wheeze/rales  CV - s1s2 regular rate and rhythm, no murmurs, no peripheral edema, radial pulses symmetric  GI - soft, non tender, no masses  Lymph - no adenopathy noted in neck and axillary areas  MSK - normal gait  Ext - no cyanosis, clubbing, or joint inflammation noted  Skin - no rashes, lesions, or ulcers  Neuro - normal strength, oriented x 3  Psych - normal mood and affect  Discussion:  She has recurrent episodes of cough.  This seems seasonal, and flares up in the Winter time especially when travelling.  She reports having recent allergy testing that was negative.  Recent chest xray was negative also.  She reports clinical improvement with therapy for rhinitis which is likely irritant induced.  I don't think her history of reflux is playing a significant role at this time.  Her symptom description isn't suggestive of asthma at this time.  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 - continue fluticasone nasal spray, cetirizine, and montelukast for now - defer additional testing at this time - will reassess her medical regimen at f/u in 4 weeks  History of GERD. - continue prilosec per PCP  History of rheumatoid arthritis. -  discussed how RA can contribute to lung disease and symptom can be cough, but seems less likely in current situation - she is followed by Dr. Trudie Reed with rheumatology   Patient Instructions  Follow up in 4 weeks  A total of  27 minutes were spent face to face with the patient and more than half of that time involved counseling or coordination of care.  Chesley Mires, MD  Pulmonary/Critical Care Pager: [phone] 06/28/2019, 10:51 AM  Flow Sheet     Pulmonary tests:  PFT 05/21/15 >> FEV1 1.70 (82%), FEV1% 83, DLCO 103% CT chest 06/20/15 >> atherosclerosis, fatty  liver, lungs clear  Medications:   Allergies as of 06/28/2019      Reactions   Aleve [naproxen] Other (See Comments)   GI upset      Medication List       Accurate as of June 28, 2019 10:51 AM. If you have any questions, ask your nurse or doctor.        CAL-MAG-ZINC-D PO Take 2 tablets by mouth daily.   cetirizine 10 MG tablet Commonly known as: ZYRTEC Take 10 mg by mouth daily.   clobetasol cream 0.05 % Commonly known as: TEMOVATE Apply 1 application topically 2 (two) times daily as needed (irritation).   estradiol 0.1 MG/GM vaginal cream Commonly known as: ESTRACE Place 1 Applicatorful vaginally 2 (two) times a week.   FLUTICASONE PROPIONATE (INHAL) IN Inhale 1 spray into the lungs daily.   folic acid 195 MCG tablet Commonly known as: FOLVITE Take 800 mcg by mouth daily.   HYDROcodone-acetaminophen 5-325 MG tablet Commonly known as: NORCO/VICODIN Take 1-2 tablets by mouth every 6 (six) hours as needed for moderate pain or severe pain.   hydroxychloroquine 200 MG tablet Commonly known as: PLAQUENIL Take 200 mg by mouth 2 (two) times daily.   hydrOXYzine 10 MG tablet Commonly known as: ATARAX/VISTARIL Take 10 mg by mouth every 6 (six) hours as needed for itching (hives).   levothyroxine 25 MCG tablet Commonly known as: SYNTHROID Take 25 mcg by mouth daily before breakfast.   methotrexate 2.5 MG tablet Commonly known as: RHEUMATREX Take 10 mg by mouth every Friday.   montelukast 10 MG tablet Commonly known as: SINGULAIR Take 10 mg by mouth at bedtime.   MOVE FREE JOINT HEALTH ADVANCE PO Take 2 tablets by mouth daily.   multivitamin with minerals Tabs tablet Take 1 tablet by mouth daily.   olmesartan 20 MG tablet Commonly known as: BENICAR Take 20 mg by mouth daily before breakfast.   omeprazole 40 MG capsule Commonly known as: PRILOSEC Take 40 mg by mouth daily.   OVER THE COUNTER MEDICATION Take 2 tablets by mouth daily. Viviscal for Hair    PARoxetine 20 MG tablet Commonly known as: PAXIL Take 20 mg by mouth daily.   polyethylene glycol 17 g packet Commonly known as: MIRALAX / GLYCOLAX Take 17 g by mouth daily.   PROBIOTIC DAILY PO Take 1 capsule by mouth daily.   REFRESH TEARS OP Apply 1 drop to eye daily as needed (dry eyes).   REFRESH LIQUIGEL OP Apply 1 drop to eye at bedtime.   simvastatin 40 MG tablet Commonly known as: ZOCOR Take 40 mg by mouth every evening.   Turmeric 500 MG Caps Take 2 capsules by mouth daily.   Vitamin D 50 MCG (2000 UT) Caps Take 4,000 Units by mouth daily.       Past Surgical History:  She  has a past surgical history  that includes Tonsillectomy; Colonoscopy; Hemorroidectomy; Vaginal delivery; Appendectomy; laparoscopic appendectomy (N/A, 01/06/2017); Ventral hernia repair (N/A, 09/12/2018); and Insertion of mesh (N/A, 09/12/2018).  Family History:  Her family history includes Breast cancer in her daughter and maternal grandfather.  Social History:  She  reports that she has never smoked. She has never used smokeless tobacco. She reports current alcohol use. She reports that she does not use drugs.

## 2019-07-11 ENCOUNTER — Ambulatory Visit
Admission: RE | Admit: 2019-07-11 | Discharge: 2019-07-11 | Disposition: A | Discharge status: Home / Self Care | Payer: Medicare Other | Source: Ambulatory Visit | Attending: Internal Medicine | Admitting: Internal Medicine

## 2019-07-11 ENCOUNTER — Other Ambulatory Visit: Payer: Self-pay

## 2019-07-11 DIAGNOSIS — M8588 Other specified disorders of bone density and structure, other site: Secondary | ICD-10-CM

## 2019-07-11 DIAGNOSIS — Z1231 Encounter for screening mammogram for malignant neoplasm of breast: Secondary | ICD-10-CM

## 2019-07-21 ENCOUNTER — Ambulatory Visit: Payer: Medicare Other | Admitting: Pulmonary Disease

## 2019-10-11 ENCOUNTER — Other Ambulatory Visit: Payer: Self-pay | Admitting: Internal Medicine

## 2019-10-11 ENCOUNTER — Other Ambulatory Visit: Payer: Medicare Other

## 2019-10-11 DIAGNOSIS — J42 Unspecified chronic bronchitis: Secondary | ICD-10-CM

## 2019-10-20 ENCOUNTER — Other Ambulatory Visit: Payer: Self-pay

## 2019-10-20 ENCOUNTER — Ambulatory Visit
Admission: RE | Admit: 2019-10-20 | Discharge: 2019-10-20 | Disposition: A | Discharge status: Home / Self Care | Payer: Medicare Other | Source: Ambulatory Visit | Attending: Internal Medicine | Admitting: Internal Medicine

## 2019-10-20 ENCOUNTER — Other Ambulatory Visit: Payer: Medicare Other

## 2019-10-20 DIAGNOSIS — J42 Unspecified chronic bronchitis: Secondary | ICD-10-CM

## 2019-10-20 MED ORDER — IOPAMIDOL (ISOVUE-300) INJECTION 61%
75.0000 mL | Freq: Once | INTRAVENOUS | Status: AC | PRN
  Administered 2019-10-20: 75 mL via INTRAVENOUS

## 2019-10-25 ENCOUNTER — Encounter: Payer: Self-pay | Admitting: Pulmonary Disease

## 2019-10-25 ENCOUNTER — Ambulatory Visit: Payer: Medicare Other | Admitting: Pulmonary Disease

## 2019-10-25 ENCOUNTER — Other Ambulatory Visit: Payer: Self-pay

## 2019-10-25 VITALS — BP 146/82 | HR 82 | Temp 97.2°F | Ht 63.5 in | Wt 195.0 lb

## 2019-10-25 DIAGNOSIS — M069 Rheumatoid arthritis, unspecified: Secondary | ICD-10-CM

## 2019-10-25 DIAGNOSIS — R05 Cough: Secondary | ICD-10-CM

## 2019-10-25 DIAGNOSIS — R059 Cough, unspecified: Secondary | ICD-10-CM

## 2019-10-25 LAB — COMPREHENSIVE METABOLIC PANEL
ALT: 27 U/L (ref 0–35)
AST: 25 U/L (ref 0–37)
Albumin: 4.5 g/dL (ref 3.5–5.2)
Alkaline Phosphatase: 69 U/L (ref 39–117)
BUN: 16 mg/dL (ref 6–23)
CO2: 28 mEq/L (ref 19–32)
Calcium: 9.5 mg/dL (ref 8.4–10.5)
Chloride: 100 mEq/L (ref 96–112)
Creatinine, Ser: 0.94 mg/dL (ref 0.40–1.20)
GFR: 57.35 mL/min — ABNORMAL LOW (ref >60.00)
Glucose, Bld: 102 mg/dL — ABNORMAL HIGH (ref 70–99)
Potassium: 4.3 mEq/L (ref 3.5–5.1)
Sodium: 136 mEq/L (ref 135–145)
Total Bilirubin: 0.4 mg/dL (ref 0.2–1.2)
Total Protein: 7 g/dL (ref 6.0–8.3)

## 2019-10-25 LAB — CBC WITH DIFFERENTIAL/PLATELET
Basophils Absolute: 0 10*3/uL (ref 0.0–0.1)
Basophils Relative: 0.6 % (ref 0.0–3.0)
Eosinophils Absolute: 0.1 10*3/uL (ref 0.0–0.7)
Eosinophils Relative: 1 % (ref 0.0–5.0)
HCT: 36.7 % (ref 36.0–46.0)
Hemoglobin: 12.5 g/dL (ref 12.0–15.0)
Lymphocytes Relative: 25 % (ref 12.0–46.0)
Lymphs Abs: 1.6 10*3/uL (ref 0.7–4.0)
MCHC: 34 g/dL (ref 30.0–36.0)
MCV: 96.8 fl (ref 78.0–100.0)
Monocytes Absolute: 0.7 10*3/uL (ref 0.1–1.0)
Monocytes Relative: 11 % (ref 3.0–12.0)
Neutro Abs: 4.1 10*3/uL (ref 1.4–7.7)
Neutrophils Relative %: 62.4 % (ref 43.0–77.0)
Platelets: 237 10*3/uL (ref 150.0–400.0)
RBC: 3.79 Mil/uL — ABNORMAL LOW (ref 3.87–5.11)
RDW: 13.3 % (ref 11.5–15.5)
WBC: 6.5 10*3/uL (ref 4.0–10.5)

## 2019-10-25 LAB — SEDIMENTATION RATE: Sed Rate: 21 mm/hr (ref 0–30)

## 2019-10-25 NOTE — Patient Instructions (Signed)
Lab tests today  Follow up in 1 week

## 2019-10-25 NOTE — Addendum Note (Signed)
Addended by: Suzzanne Cloud E on: 10/25/2019 02:36 PM   Modules accepted: Orders

## 2019-10-25 NOTE — Progress Notes (Signed)
[facility]  Chief Complaint  Patient presents with  . Follow-up    She has been coughing less but still has "lung discomfort". Dr Lenna Gilford advised her to hold her methotrexate and she has been off of this for approx 4 wks now.     Constitutional:  BP (!) 146/82 (BP Location: Right Arm, Cuff Size: Normal)   Pulse 82   Temp (!) 97.2 F (36.2 C) (Temporal)   Ht 5' 3.5" (1.613 m)   Wt 195 lb (88.5 kg)   SpO2 97% Comment: on RA  BMI 34.00 kg/m   Past Medical History:  HTN, GERD, OA, RA, Anxiety, Hives  Brief Summary:  Rhonda [name] is a 78 y.o. female with chronic cough.  She continued to have cough and back discomfort since her last visit.  Eventually had CT chest.  This was reviewed by me.  Showed centrilobular nodules with upper lobe predominance.    She is not having fever, sweats, skin rash, or hemoptysis.  She was advised by rheumatology to hold off on using MTX.  She has been off this for past 3 weeks.  She has noticed more joint discomfort.    Her cough started after she returned from trip to [place] in January/February of 2020.  She had been treated with short course of antibiotics and prednisone.  Her husband is worried that she might have been exposed to mold and might have exposure to cleaning chemicals.  Physical Exam:   Appearance - well kempt   ENMT - no sinus tenderness, no nasal discharge, no oral exudate  Neck - no masses, trachea midline, no thyromegaly, no elevation in JVP  Respiratory - normal appearance of chest wall, normal respiratory effort w/o accessory muscle use, no dullness on percussion, no wheezing or rales  CV - s1s2 regular rate and rhythm, no murmurs, no peripheral edema, radial pulses symmetric  GI - soft, non tender  Lymph - no adenopathy noted in neck and axillary areas  MSK - normal gait  Ext - no cyanosis, clubbing, or joint inflammation noted  Skin - no rashes, lesions, or ulcers  Neuro -  normal strength, oriented x 3  Psych - normal mood and affect   Discussion:  She has recurrent cough.  She has hx of rheumatoid arthritis.  Recent CT chest shows changes of centrilobular ground glass nodules in upper lobes b/l.  Assessment/Plan:   Cough with GGO nodules on CT chest. 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 - continue fluticasone, cetirizine, and montelukast  History of GERD. - continue prilosec per PCP  History of rheumatoid arthritis. - followed by Dr. Trudie Reed with rheumatology - advised to hold off on restarting MTX for now   Patient Instructions  Lab tests today  Follow up in 1 week    Chesley Mires, MD [place] Pager: [phone] 10/25/2019, 2:29 PM  Flow Sheet     Pulmonary tests:  PFT 05/21/15 >> FEV1 1.70 (82%), FEV1% 83, DLCO 103%  Chest imaging:  CT chest 06/20/15 >> atherosclerosis, fatty liver, lungs clear CT chest 10/20/19 >> atherosclerosis, scattered centrilobular GGO nodules in upper chest b/l up to 4 mm  Medications:   Allergies as of 10/25/2019      Reactions   Aleve [naproxen] Other (See Comments)   GI upset  Medication List       Accurate as of October 25, 2019  2:29 PM. If you have any questions, ask your nurse or doctor.        STOP taking these medications   cetirizine 10 MG tablet Commonly known as: ZYRTEC Stopped by: Chesley Mires, MD   FLUTICASONE PROPIONATE (INHAL) IN Stopped by: Chesley Mires, MD   HYDROcodone-acetaminophen 5-325 MG tablet Commonly known as: NORCO/VICODIN Stopped by: Chesley Mires, MD   MOVE FREE JOINT HEALTH ADVANCE PO Stopped by: Chesley Mires, MD   PARoxetine 20 MG tablet Commonly known as: PAXIL Stopped by: Chesley Mires, MD     TAKE these medications   amLODipine 5 MG tablet Commonly known as:  NORVASC Take 5 mg by mouth daily.   CAL-MAG-ZINC-D PO Take 2 tablets by mouth daily.   clobetasol cream 0.05 % Commonly known as: TEMOVATE Apply 1 application topically 2 (two) times daily as needed (irritation).   estradiol 0.1 MG/GM vaginal cream Commonly known as: ESTRACE Place 1 Applicatorful vaginally 2 (two) times a week.   folic acid 378 MCG tablet Commonly known as: FOLVITE Take 800 mcg by mouth daily.   hydroxychloroquine 200 MG tablet Commonly known as: PLAQUENIL Take 200 mg by mouth 2 (two) times daily.   hydrOXYzine 10 MG tablet Commonly known as: ATARAX/VISTARIL Take 10 mg by mouth every 6 (six) hours as needed for itching (hives).   levothyroxine 25 MCG tablet Commonly known as: SYNTHROID Take 25 mcg by mouth daily before breakfast.   methotrexate 2.5 MG tablet Commonly known as: RHEUMATREX Take 10 mg by mouth every Friday.   montelukast 10 MG tablet Commonly known as: SINGULAIR Take 10 mg by mouth at bedtime.   multivitamin with minerals Tabs tablet Take 1 tablet by mouth daily.   olmesartan 20 MG tablet Commonly known as: BENICAR Take 20 mg by mouth daily before breakfast.   omeprazole 40 MG capsule Commonly known as: PRILOSEC Take 40 mg by mouth daily.   OVER THE COUNTER MEDICATION Take 2 tablets by mouth daily. Viviscal for Hair   polyethylene glycol 17 g packet Commonly known as: MIRALAX / GLYCOLAX Take 17 g by mouth daily.   PROBIOTIC DAILY PO Take 1 capsule by mouth daily.   REFRESH LIQUIGEL OP Apply 1 drop to eye at bedtime. What changed: Another medication with the same name was removed. Continue taking this medication, and follow the directions you see here. Changed by: Chesley Mires, MD   simvastatin 40 MG tablet Commonly known as: ZOCOR Take 40 mg by mouth every evening.   Turmeric 500 MG Caps Take 2 capsules by mouth daily.   Vitamin D 50 MCG (2000 UT) Caps Take 4,000 Units by mouth daily.       Past Surgical  History:  She  has a past surgical history that includes Tonsillectomy; Colonoscopy; Hemorroidectomy; Vaginal delivery; Appendectomy; laparoscopic appendectomy (N/A, 01/06/2017); Ventral hernia repair (N/A, 09/12/2018); and Insertion of mesh (N/A, 09/12/2018).  Family History:  Her family history includes Breast cancer in her daughter and maternal grandfather.  Social History:  She  reports that she has never smoked. She has never used smokeless tobacco. She reports current alcohol use. She reports that she does not use drugs.

## 2019-10-29 LAB — ANTINUCLEAR ANTIBODIES, IFA: ANA Titer 1: NEGATIVE

## 2019-10-30 LAB — HYPERSENSITIVITY PNUEMONITIS PROFILE
ASPERGILLUS FUMIGATUS: NEGATIVE
Faenia retivirgula: NEGATIVE
Pigeon Serum: NEGATIVE
S. VIRIDIS: NEGATIVE
T. CANDIDUS: NEGATIVE
T. VULGARIS: NEGATIVE

## 2019-10-30 LAB — RHEUMATOID FACTOR: Rhuematoid fact SerPl-aCnc: <14 IU/mL (ref <14)

## 2019-11-01 ENCOUNTER — Other Ambulatory Visit: Payer: Self-pay

## 2019-11-01 ENCOUNTER — Encounter: Payer: Self-pay | Admitting: Pulmonary Disease

## 2019-11-01 ENCOUNTER — Ambulatory Visit: Payer: Medicare Other | Admitting: Pulmonary Disease

## 2019-11-01 VITALS — BP 140/70 | HR 85 | Ht 63.5 in | Wt 195.8 lb

## 2019-11-01 DIAGNOSIS — M069 Rheumatoid arthritis, unspecified: Secondary | ICD-10-CM | POA: Diagnosis not present

## 2019-11-01 DIAGNOSIS — R05 Cough: Secondary | ICD-10-CM | POA: Diagnosis not present

## 2019-11-01 DIAGNOSIS — R058 Other specified cough: Secondary | ICD-10-CM

## 2019-11-01 DIAGNOSIS — R059 Cough, unspecified: Secondary | ICD-10-CM

## 2019-11-01 NOTE — Progress Notes (Signed)
Rhonda [name], Rhonda [name], Rhonda [name]  Chief Complaint  Patient presents with  . Follow-up    1-week follow up for cough.  Pt states she is still coughing but cough is better.    Constitutional:  BP 140/70 (BP Location: Left Arm, Patient Position: Sitting, Cuff Size: Large)   Pulse 85   Ht 5' 3.5" (1.613 m)   Wt 195 lb 12.8 oz (88.8 kg)   SpO2 98%   BMI 34.14 kg/m   Past Medical History:  HTN, GERD, OA, RA, Anxiety, Hives  Brief Summary:  Rhonda [name] is a 78 y.o. female with chronic cough.  She is here to review her lab work.  This was unrevealing for cause of her cough Rhonda CT chest changes.  She continues to have a cough, but maybe some improvement.  She remains off of MTX.  She has noticed more joint stiffness over the past 1 week.  Not having fever, sputum, or hemoptysis.  Physical Exam:   Appearance - well kempt   ENMT - no sinus tenderness, no nasal discharge, no oral exudate  Neck - no masses, trachea midline, no thyromegaly, no elevation in JVP  Respiratory - normal appearance of chest wall, normal respiratory effort w/o accessory muscle use, no dullness on percussion, no wheezing or rales  CV - s1s2 regular rate Rhonda rhythm, no murmurs, no peripheral edema, radial pulses symmetric  GI - soft, non tender  Lymph - no adenopathy noted in neck Rhonda axillary areas  MSK - normal gait  Ext - no cyanosis, clubbing, or joint inflammation noted  Skin - no rashes, lesions, or ulcers  Neuro - normal strength, oriented x 3  Psych - normal mood Rhonda affect   Discussion:  She has recurrent cough.  She has hx of rheumatoid arthritis.  Recent CT chest shows changes of centrilobular ground glass nodules in upper lobes b/l.  Concern is for either atypical infectious process versus inflammatory process.  Serology was negative.    Assessment/Plan:   Cough with GGO nodules on CT chest. 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 - continue singulair, Rhonda OTC antihistamine  History of GERD. - continue prilosec through PCP  History of rheumatoid arthritis. - followed by Dr. Trudie Reed with rheumatology - would prefer to hold off on resuming methotrexate until after preliminary bronchoscopy results are available   Patient Instructions  Will call you once we get date/time scheduled for bronchoscopy  Follow up in 2 weeks    Chesley Mires, MD [place] [name]/Rhonda [name] Pager: [phone] 11/01/2019, 12:00 PM  Flow Sheet     [name] tests:  PFT 05/21/15 >> FEV1 1.70 (82%), FEV1% 83, DLCO 103%  Serology:  10/25/19 >> HP panel negative, ANA negative, RF < 14, ESR 21  Chest imaging:  CT chest 06/20/15 >> atherosclerosis, fatty liver, lungs clear CT chest 10/20/19 >> atherosclerosis, scattered centrilobular GGO nodules in upper chest b/l up to 4 mm  Medications:   Allergies as of 11/01/2019      Reactions   Aleve [naproxen] Other (See Comments)   GI upset      Medication List       Accurate as of November 01, 2019 12:00 PM. If you have any questions, ask your nurse or doctor.  amLODipine 5 MG tablet Commonly known as: NORVASC Take 5 mg by mouth daily.   CAL-MAG-ZINC-D PO Take 2 tablets by mouth daily.   clobetasol cream 0.05 % Commonly known as: TEMOVATE Apply 1 application topically 2 (two) times daily as needed (irritation).   estradiol 0.1 MG/GM vaginal cream Commonly known as: ESTRACE Place 1 Applicatorful vaginally 2 (two) times a week.   folic acid 321 MCG tablet Commonly known as: FOLVITE Take 800 mcg by mouth daily.   hydroxychloroquine 200 MG tablet Commonly known as: PLAQUENIL Take 200 mg by mouth 2 (two) times daily.   hydrOXYzine 10 MG tablet  Commonly known as: ATARAX/VISTARIL Take 10 mg by mouth every 6 (six) hours as needed for itching (hives).   levothyroxine 25 MCG tablet Commonly known as: SYNTHROID Take 25 mcg by mouth daily before breakfast.   methotrexate 2.5 MG tablet Commonly known as: RHEUMATREX Take 10 mg by mouth every Friday.   montelukast 10 MG tablet Commonly known as: SINGULAIR Take 10 mg by mouth at bedtime.   multivitamin with minerals Tabs tablet Take 1 tablet by mouth daily.   olmesartan 20 MG tablet Commonly known as: BENICAR Take 20 mg by mouth daily before breakfast.   omeprazole 40 MG capsule Commonly known as: PRILOSEC Take 40 mg by mouth daily.   OVER THE COUNTER MEDICATION Take 2 tablets by mouth daily. Viviscal for Hair   polyethylene glycol 17 g packet Commonly known as: MIRALAX / GLYCOLAX Take 17 g by mouth daily.   PROBIOTIC DAILY PO Take 1 capsule by mouth daily.   REFRESH LIQUIGEL OP Apply 1 drop to eye at bedtime.   simvastatin 40 MG tablet Commonly known as: ZOCOR Take 40 mg by mouth every evening.   Turmeric 500 MG Caps Take 2 capsules by mouth daily.   Vitamin D 50 MCG (2000 UT) Caps Take 4,000 Units by mouth daily.       Past Surgical History:  She  has a past surgical history that includes Tonsillectomy; Colonoscopy; Hemorroidectomy; Vaginal delivery; Appendectomy; laparoscopic appendectomy (N/A, 01/06/2017); Ventral hernia repair (N/A, 09/12/2018); Rhonda Insertion of mesh (N/A, 09/12/2018).  Family History:  Her family history includes Breast cancer in her daughter Rhonda maternal grandfather.  Social History:  She  reports that she has never smoked. She has never used smokeless tobacco. She reports current alcohol use. She reports that she does not use drugs.

## 2019-11-01 NOTE — Patient Instructions (Signed)
Will call you once we get date/time scheduled for bronchoscopy  Follow up in 2 weeks

## 2019-11-02 ENCOUNTER — Telehealth: Payer: Self-pay | Admitting: Pulmonary Disease

## 2019-11-02 NOTE — Telephone Encounter (Signed)
I called the patient to let her know of COVID test needed prior to procedure. The patient has been scheduled for Saturday 12/04/19 at 12:40 (per [facility]), advised she has to isolate after the test to make sure it is a true negative result. Patient voiced understanding, nothing further needed at this time.

## 2019-11-02 NOTE — Telephone Encounter (Signed)
She has been scheduled for bronchoscopy on Tuesday, 11/07/19 at 1 pm.  Procedure will be done at [facility].  The patient has been informed of this.  Please contact the patient and arrange for COVID testing prior to her procedure.  COVID testing needs to be performed on Friday, 11/03/19.

## 2019-11-03 ENCOUNTER — Inpatient Hospital Stay (HOSPITAL_COMMUNITY): Admission: RE | Admit: 2019-11-03 | Payer: Medicare Other | Source: Ambulatory Visit

## 2019-11-04 ENCOUNTER — Other Ambulatory Visit (HOSPITAL_COMMUNITY)
Admission: RE | Admit: 2019-11-04 | Discharge: 2019-11-04 | Disposition: A | Discharge status: Home / Self Care | Payer: Medicare Other | Source: Ambulatory Visit | Attending: Pulmonary Disease | Admitting: Pulmonary Disease

## 2019-11-04 DIAGNOSIS — Z20828 Contact with and (suspected) exposure to other viral communicable diseases: Secondary | ICD-10-CM | POA: Diagnosis not present

## 2019-11-04 DIAGNOSIS — Z01812 Encounter for preprocedural laboratory examination: Secondary | ICD-10-CM | POA: Insufficient documentation

## 2019-11-05 LAB — SARS CORONAVIRUS 2 (TAT 6-24 HRS): SARS Coronavirus 2: NEGATIVE

## 2019-11-07 ENCOUNTER — Ambulatory Visit (HOSPITAL_COMMUNITY): Payer: Medicare Other

## 2019-11-07 ENCOUNTER — Ambulatory Visit (HOSPITAL_COMMUNITY)
Admission: RE | Admit: 2019-11-07 | Discharge: 2019-11-07 | Disposition: A | Discharge status: Home / Self Care | Payer: Medicare Other | Attending: Pulmonary Disease | Admitting: Pulmonary Disease

## 2019-11-07 ENCOUNTER — Ambulatory Visit (HOSPITAL_COMMUNITY)
Admission: RE | Admit: 2019-11-07 | Discharge: 2019-11-07 | Disposition: A | Discharge status: Home / Self Care | Payer: Medicare Other | Source: Ambulatory Visit | Attending: Pulmonary Disease | Admitting: Pulmonary Disease

## 2019-11-07 ENCOUNTER — Encounter (HOSPITAL_COMMUNITY)
Admission: RE | Disposition: A | Discharge status: Home / Self Care | Payer: Self-pay | Source: Home / Self Care | Attending: Pulmonary Disease

## 2019-11-07 DIAGNOSIS — I10 Essential (primary) hypertension: Secondary | ICD-10-CM | POA: Insufficient documentation

## 2019-11-07 DIAGNOSIS — J849 Interstitial pulmonary disease, unspecified: Secondary | ICD-10-CM | POA: Insufficient documentation

## 2019-11-07 DIAGNOSIS — Z886 Allergy status to analgesic agent status: Secondary | ICD-10-CM | POA: Diagnosis not present

## 2019-11-07 DIAGNOSIS — Z9889 Other specified postprocedural states: Secondary | ICD-10-CM

## 2019-11-07 DIAGNOSIS — M1990 Unspecified osteoarthritis, unspecified site: Secondary | ICD-10-CM | POA: Diagnosis not present

## 2019-11-07 DIAGNOSIS — M069 Rheumatoid arthritis, unspecified: Secondary | ICD-10-CM | POA: Diagnosis not present

## 2019-11-07 DIAGNOSIS — K219 Gastro-esophageal reflux disease without esophagitis: Secondary | ICD-10-CM | POA: Insufficient documentation

## 2019-11-07 DIAGNOSIS — Z79890 Hormone replacement therapy (postmenopausal): Secondary | ICD-10-CM | POA: Insufficient documentation

## 2019-11-07 DIAGNOSIS — Z79899 Other long term (current) drug therapy: Secondary | ICD-10-CM | POA: Diagnosis not present

## 2019-11-07 HISTORY — PX: VIDEO BRONCHOSCOPY: SHX5072

## 2019-11-07 LAB — BODY FLUID CELL COUNT WITH DIFFERENTIAL
Eos, Fluid: 5 %
Lymphs, Fluid: 25 %
Monocyte-Macrophage-Serous Fluid: 3 % — ABNORMAL LOW (ref 50–90)
Neutrophil Count, Fluid: 67 % — ABNORMAL HIGH (ref 0–25)
Total Nucleated Cell Count, Fluid: 930 cu mm (ref 0–1000)

## 2019-11-07 SURGERY — BRONCHOSCOPY, WITH FLUOROSCOPY
Anesthesia: Moderate Sedation | Laterality: Bilateral

## 2019-11-07 MED ORDER — MIDAZOLAM HCL (PF) 10 MG/2ML IJ SOLN
INTRAMUSCULAR | Status: DC | PRN
  Administered 2019-11-07: 2 mg via INTRAVENOUS
  Administered 2019-11-07: 1 mg via INTRAVENOUS
  Administered 2019-11-07: 2 mg via INTRAVENOUS

## 2019-11-07 MED ORDER — FENTANYL CITRATE (PF) 100 MCG/2ML IJ SOLN
INTRAMUSCULAR | Status: AC
  Filled 2019-11-07: qty 4

## 2019-11-07 MED ORDER — LIDOCAINE HCL (PF) 1 % IJ SOLN
INTRAMUSCULAR | Status: DC | PRN
  Administered 2019-11-07: 6 mL

## 2019-11-07 MED ORDER — LIDOCAINE HCL URETHRAL/MUCOSAL 2 % EX GEL: 1.0000 "application " | Freq: Once | CUTANEOUS | Status: DC

## 2019-11-07 MED ORDER — MIDAZOLAM HCL (PF) 5 MG/ML IJ SOLN
INTRAMUSCULAR | Status: AC
  Filled 2019-11-07: qty 2

## 2019-11-07 MED ORDER — BUTAMBEN-TETRACAINE-BENZOCAINE 2-2-14 % EX AERO: 1.0000 | INHALATION_SPRAY | Freq: Once | CUTANEOUS | Status: DC

## 2019-11-07 MED ORDER — PHENYLEPHRINE HCL 0.25 % NA SOLN: 1.0000 | Freq: Four times a day (QID) | NASAL | Status: DC | PRN

## 2019-11-07 MED ORDER — SODIUM CHLORIDE 0.9 % IV SOLN
INTRAVENOUS | Status: DC
  Administered 2019-11-07: 13:00:00 via INTRAVENOUS

## 2019-11-07 MED ORDER — FENTANYL CITRATE (PF) 100 MCG/2ML IJ SOLN
INTRAMUSCULAR | Status: DC | PRN
  Administered 2019-11-07 (×3): 50 ug via INTRAVENOUS

## 2019-11-07 NOTE — Op Note (Signed)
[facility] Cardiopulmonary Patient Name: Rhonda [name] Date: 11/07/2019 MRN: [id number] Attending MD: Chesley Mires , MD Date of Birth: 09/02/1941 CSN: Finalized Age: 78 Admit Type: Outpatient Gender: Female Procedure:             Bronchoscopy Indications:           Interstitial lung disease Providers:             Chesley Mires, MD, Ciro Backer RRT, RCP, Ashley Mariner                         RRT,RCP Referring MD:           Medicines:             Fentanyl 150 mcg IV, Midazolam 5 mg IV, Lidocaine 2%                         applied to cords 6 mL, Lidocaine 2% applied to the                         tracheobronchial tree 2 mL Complications:         No immediate complications Estimated Blood Loss:  Estimated blood loss: none. Procedure:             Pre-Anesthesia Assessment:                        - A History and Physical has been performed. The                         patient's medications, allergies and sensitivities                         have been reviewed.                        - The risks and benefits of the procedure and the                         sedation options and risks were discussed with the                         patient. All questions were answered and informed                         consent was obtained.                        After obtaining informed consent, the bronchoscope was                         passed under direct vision. Throughout the procedure,                         the patient's blood pressure, pulse, and oxygen                         saturations were monitored continuously. the [id number]                         ([id number]) Olympus Therapeutic  Bronchoscope was                         introduced through the mouth and advanced to the                         tracheobronchial tree of both lungs. The procedure was                         accomplished without difficulty. The patient tolerated                         the procedure well.  The total duration of the                         procedure was 21 minutes. Scope In: 1:11:24 PM Scope Out: 1:21:54 PM Findings:      The oropharynx appears normal. The larynx appears normal. The vocal       cords appear normal. The subglottic space is normal. The trachea is of       normal caliber. The carina is sharp. The tracheobronchial tree was       examined to at least the first subsegmental level. Bronchial anatomy       normal. Airways were friable. There are no endobronchial lesions, and no       secretions.      Transbronchial biopsies of an area of infiltration were performed in the       right upper lobe using alligator forceps. The procedure was guided by       fluoroscopy. Fluoroscopy time was 11 seconds. Transbronchial biopsy       technique was selected because the sampling site was not visible       endoscopically. Four biopsy passes were performed. Four biopsy samples       were obtained.      Bronchoalveolar lavage was performed in the right upper lobe of the lung       and sent for cell count, bacterial culture, viral smears & culture, and       fungal & AFB analysis and cytology. 60 mL of fluid were instilled. 20 mL       were returned. The return was bloody.      Estimated blood loss: none. Impression:            - Interstitial lung disease                        - Transbronchial lung biopsies were performed.                        - Bronchoalveolar lavage was performed. Moderate Sedation:      Moderate (conscious) sedation was personally administered by the       endoscopist. The following parameters were monitored: oxygen saturation,       heart rate, blood pressure, and response to care. Total physician       intraservice time was 18 minutes. Recommendation:        - Await BAL, biopsy, culture and cytology results. Procedure Code(s):     --- Professional ---                        [phone], Bronchoscopy, rigid or flexible, including  fluoroscopic guidance, when performed; with                         transbronchial lung biopsy(s), single lobe                        86484, Bronchoscopy, rigid or flexible, including                         fluoroscopic guidance, when performed; with bronchial                         alveolar lavage                        [id number], Moderate sedation services provided by the same                         physician or other qualified health care professional                         performing the diagnostic or therapeutic service that                         the sedation supports, requiring the presence of an                         independent trained observer to assist in the                         monitoring of the patient's level of consciousness and                         physiological status; initial 15 minutes of                         intraservice time, patient age 25 years or older Diagnosis Code(s):     --- Professional ---                        J84.9, Interstitial pulmonary disease, unspecified CPT copyright 2019 American Medical Association. All rights reserved. The codes documented in this report are preliminary and upon coder review may  be revised to meet current compliance requirements. Chesley Mires, MD Chesley Mires, MD 11/07/2019 1:38:46 PM This report has been signed electronically. Number of Addenda: 0

## 2019-11-07 NOTE — Progress Notes (Signed)
Video bronchoscopy performed Intervention bronchial washings Intervention bronchial biopsies Patient tolerated well   Ciro Backer RRT

## 2019-11-07 NOTE — Discharge Instructions (Signed)
Flexible Bronchoscopy, Care After This sheet gives you information about how to care for yourself after your test. Your doctor may also give you more specific instructions. If you have problems or questions, contact your doctor. Follow these instructions at home: Eating and drinking  Do not eat or drink anything (not even water) for 2 hours after your test, or until your numbing medicine (local anesthetic) wears off.  When your numbness is gone and your cough and gag reflexes have come back, you may: ? Eat only soft foods. ? Slowly drink liquids.  The day after the test, go back to your normal diet. Driving  Do not drive for 24 hours if you were given a medicine to help you relax (sedative).  Do not drive or use heavy machinery while taking prescription pain medicine. General instructions   Take over-the-counter and prescription medicines only as told by your doctor.  Return to your normal activities as told. Ask what activities are safe for you.  Do not use any products that have nicotine or tobacco in them. This includes cigarettes and e-cigarettes. If you need help quitting, ask your doctor.  Keep all follow-up visits as told by your doctor. This is important. It is very important if you had a tissue sample (biopsy) taken. Get help right away if:  You have shortness of breath that gets worse.  You get light-headed.  You feel like you are going to pass out (faint).  You have chest pain.  You cough up: ? More than a little blood. ? More blood than before. Summary  Do not eat or drink anything (not even water) for 2 hours after your test, or until your numbing medicine wears off.  Do not use cigarettes. Do not use e-cigarettes.  Get help right away if you have chest pain. This information is not intended to replace advice given to you by your health care provider. Make sure you discuss any questions you have with your health care provider. Document Released: 10/11/2009  Document Revised: 11/26/2017 Document Reviewed: 01/01/2017 Elsevier Patient Education  2020 [place] not eat or drink until after 3:30 today 11/07/2019

## 2019-11-07 NOTE — H&P (Signed)
Pulmonary, Critical Care, and Sleep Medicine  CC: Cough  Constitutional:  BP (!) 150/63   Pulse 88   Temp 98.7 F (37.1 C) (Oral)   Resp 14   SpO2 97%   Past Medical History:  HTN, GERD, OA, RA, Anxiety, Hives  Brief Summary:  Rhonda [name] is a 78 y.o. female with chronic cough.  She is here to review her lab work.  This was unrevealing for cause of her cough and CT chest changes.  She continues to have a cough, but maybe some improvement.  She remains off of MTX.  She has noticed more joint stiffness over the past 1 week.  Not having fever, sputum, or hemoptysis.  Physical Exam:   Appearance - well kempt   ENMT - no sinus tenderness, no nasal discharge, no oral exudate  Neck - no masses, trachea midline, no thyromegaly, no elevation in JVP  Respiratory - normal appearance of chest wall, normal respiratory effort w/o accessory muscle use, no dullness on percussion, no wheezing or rales  CV - s1s2 regular rate and rhythm, no murmurs, no peripheral edema, radial pulses symmetric  GI - soft, non tender  Lymph - no adenopathy noted in neck and axillary areas  MSK - normal gait  Ext - no cyanosis, clubbing, or joint inflammation noted  Skin - no rashes, lesions, or ulcers  Neuro - normal strength, oriented x 3  Psych - normal mood and affect   Discussion:  She has recurrent cough.  She has hx of rheumatoid arthritis.  Recent CT chest shows changes of centrilobular ground glass nodules in upper lobes b/l.  Concern is for either atypical infectious process versus inflammatory process.  Serology was negative.    Assessment/Plan:   Cough with GGO nodules on CT chest. 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 - continue  singulair, and OTC antihistamine  History of GERD. - continue prilosec through PCP  History of rheumatoid arthritis. - followed by Dr. Trudie Reed with rheumatology - would prefer to hold off on resuming methotrexate until after preliminary bronchoscopy results are available   Chesley Mires, MD [place] Pager: [phone] 11/07/2019, 12:52 PM  Flow Sheet     Pulmonary tests:  PFT 05/21/15 >> FEV1 1.70 (82%), FEV1% 83, DLCO 103%  Serology:  10/25/19 >> HP panel negative, ANA negative, RF < 14, ESR 21  Chest imaging:  CT chest 06/20/15 >> atherosclerosis, fatty liver, lungs clear CT chest 10/20/19 >> atherosclerosis, scattered centrilobular GGO nodules in upper chest b/l up to 4 mm  Medications:   Allergies as of 11/07/2019      Reactions   Aleve [naproxen] Other (See Comments)   GI upset     amLODipine 5 MG tablet Commonly known as: NORVASC Take 5 mg by mouth daily.   CAL-MAG-ZINC-D PO Take 2 tablets by mouth daily.   clobetasol cream 0.05 % Commonly known as: TEMOVATE Apply 1 application topically 2 (two) times daily as needed (irritation).   estradiol 0.1 MG/GM vaginal cream Commonly known as: ESTRACE Place 1 Applicatorful vaginally 2 (two) times a week.   folic acid 536 MCG tablet Commonly known as: FOLVITE Take 800 mcg by mouth daily.   hydroxychloroquine 200 MG tablet Commonly known as: PLAQUENIL Take 200 mg by mouth  2 (two) times daily.   hydrOXYzine 10 MG tablet Commonly known as: ATARAX/VISTARIL Take 10 mg by mouth every 6 (six) hours as needed for itching (hives).   levothyroxine 25 MCG tablet Commonly known as: SYNTHROID Take 25 mcg by mouth daily before breakfast.   methotrexate 2.5 MG tablet Commonly known as: RHEUMATREX Take 10 mg by mouth every Friday.   montelukast 10 MG tablet Commonly known as: SINGULAIR Take 10 mg by mouth at bedtime.   multivitamin with minerals Tabs tablet Take 1 tablet by mouth  daily.   olmesartan 20 MG tablet Commonly known as: BENICAR Take 20 mg by mouth daily before breakfast.   omeprazole 40 MG capsule Commonly known as: PRILOSEC Take 40 mg by mouth daily.   OVER THE COUNTER MEDICATION Take 2 tablets by mouth daily. Viviscal for Hair   polyethylene glycol 17 g packet Commonly known as: MIRALAX / GLYCOLAX Take 17 g by mouth daily.   PROBIOTIC DAILY PO Take 1 capsule by mouth daily.   REFRESH LIQUIGEL OP Apply 1 drop to eye at bedtime.   simvastatin 40 MG tablet Commonly known as: ZOCOR Take 40 mg by mouth every evening.   Turmeric 500 MG Caps Take 2 capsules by mouth daily.   Vitamin D 50 MCG (2000 UT) Caps Take 4,000 Units by mouth daily.    Past Surgical History:  She  has a past surgical history that includes Tonsillectomy; Colonoscopy; Hemorroidectomy; Vaginal delivery; Appendectomy; laparoscopic appendectomy (N/A, 01/06/2017); Ventral hernia repair (N/A, 09/12/2018); and Insertion of mesh (N/A, 09/12/2018).  Family History:  Her family history includes Breast cancer in her daughter and maternal grandfather.  Social History:  She  reports that she has never smoked. She has never used smokeless tobacco. She reports current alcohol use. She reports that she does not use drugs.

## 2019-11-08 LAB — ACID FAST SMEAR (AFB, MYCOBACTERIA): Acid Fast Smear: NEGATIVE

## 2019-11-08 LAB — CYTOLOGY - NON PAP

## 2019-11-09 LAB — SURGICAL PATHOLOGY

## 2019-11-09 LAB — CULTURE, RESPIRATORY W GRAM STAIN: Culture: NORMAL

## 2019-11-10 ENCOUNTER — Encounter (HOSPITAL_COMMUNITY): Payer: Self-pay | Admitting: Pulmonary Disease

## 2019-11-10 ENCOUNTER — Telehealth: Payer: Self-pay | Admitting: Pulmonary Disease

## 2019-11-10 NOTE — Telephone Encounter (Signed)
Called and spoke with Patient.  Patient stated she had a bronchoscopy Tuesday, 11/07/19, by Dr. Halford Chessman. Patient stated she felt bad yesterday, nausea at times, had some cough, with some occasional blood tinged, thick sputum.  Patient stated last night she felt some throbbing below her breastbone.  Patient stated is woke her last night.  Today, she feels tender under her breast bone, no coughing, no sob, and feels better then yesterday.  Patient was wanting opinion of Doctor/NP, if she needs to be concerned, or if it is something unrelated to bronchoscopy.  Message routed to [place], NP, app of the day

## 2019-11-10 NOTE — Telephone Encounter (Signed)
Sorry to hear that . It could be from the [place], it is not unusual to see blood tinged mucus on occasion that is self limiting meaning will resolve within few days .  As long as she is feeling better today and no more further bleeding it should be fine to keep a close eye on it if symptoms return she will need to seek emergency evaluation in the ER.  Would follow-up next week as planned call sooner if symptoms do not improve or worsen.  Or seek emergency room care.  Red flags to be on the outlook for would be severe chest pain, worsening pain, shortness of breath or coughing up blood that does not resolve or fever.  Please contact office for sooner follow up if symptoms do not improve or worsen or seek emergency care

## 2019-11-10 NOTE — Telephone Encounter (Signed)
Spoke with patient. She verbalized understanding of recommendations. Nothing further needed at time of call.

## 2019-11-15 ENCOUNTER — Other Ambulatory Visit: Payer: Self-pay

## 2019-11-15 ENCOUNTER — Ambulatory Visit: Payer: Medicare Other | Admitting: Pulmonary Disease

## 2019-11-15 ENCOUNTER — Encounter: Payer: Self-pay | Admitting: Pulmonary Disease

## 2019-11-15 VITALS — BP 130/68 | HR 84 | Temp 97.3°F | Ht 62.21 in | Wt 197.8 lb

## 2019-11-15 DIAGNOSIS — R059 Cough, unspecified: Secondary | ICD-10-CM

## 2019-11-15 DIAGNOSIS — R05 Cough: Secondary | ICD-10-CM

## 2019-11-15 DIAGNOSIS — K219 Gastro-esophageal reflux disease without esophagitis: Secondary | ICD-10-CM

## 2019-11-15 DIAGNOSIS — M069 Rheumatoid arthritis, unspecified: Secondary | ICD-10-CM | POA: Diagnosis not present

## 2019-11-15 DIAGNOSIS — R058 Other specified cough: Secondary | ICD-10-CM

## 2019-11-15 MED ORDER — PREDNISONE 10 MG PO TABS: ORAL_TABLET | ORAL | 0 refills | Status: AC

## 2019-11-15 NOTE — Progress Notes (Signed)
[facility], Critical Care, and Sleep Medicine  Chief Complaint  Patient presents with  . Follow-up    review results from bronch    Constitutional:  BP 130/68 (BP Location: Right Arm, Cuff Size: Normal)   Pulse 84   Temp (!) 97.3 F (36.3 C) (Temporal)   Ht 5' 2.21" (1.58 m)   Wt 197 lb 12.8 oz (89.7 kg)   SpO2 98%   BMI 35.94 kg/m   Past Medical History:  HTN, GERD, OA, RA, Anxiety, Hives  Brief Summary:  Rhonda [name] is a 78 y.o. female with chronic cough.  She had bronchoscopy last week.  Cultures negative to date; fungal and AFB cultures still pending.  BAL cytology had some cell atypia, but transbronchial biopsy showed benign lung tissue.  She is still having a cough.  Seems to happen more at night.  Has been getting some reflux.  Joint pain/stiffness has progressed.  Not having wheeze, chest congestion, or fever.  Physical Exam:   Appearance - well kempt   ENMT - no sinus tenderness, no nasal discharge, no oral exudate  Neck - no masses, trachea midline, no thyromegaly, no elevation in JVP  Respiratory - normal appearance of chest wall, normal respiratory effort w/o accessory muscle use, no dullness on percussion, no wheezing or rales  CV - s1s2 regular rate and rhythm, no murmurs, no peripheral edema, radial pulses symmetric  GI - soft, non tender  Lymph - no adenopathy noted in neck and axillary areas  MSK - normal gait  Ext - no cyanosis, clubbing, or joint inflammation noted  Skin - no rashes, lesions, or ulcers  Neuro - normal strength, oriented x 3  Psych - normal mood and affect    Discussion:  She has recurrent cough.  She has hx of rheumatoid arthritis.  Recent CT chest shows changes of centrilobular ground glass nodules in upper lobes b/l.  Bronchoscopy was negative.  Fungal and AFB cultures still pending, but suspicion for infection is low.  CT chest findings likely related to rheumatoid arthritis.  She hasn't responded to therapy  for asthma.  This seems less likely as cough of her symptoms.  She does have acid reflux and this could be contributing to her cough.   Assessment/Plan:   Cough with GGO nodules on CT chest. 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 - didn't notice improvement after trial of singulair and antihistamine; will stop both of these  History of GERD. - continue prilosec and pepcid through PCP - if cough persists, might then need further assessment by GI   History of rheumatoid arthritis. - followed by Dr. Trudie Reed with rheumatology - should be okay to resume methotrexate   Patient Instructions  Stop using montelukast  Albuterol two puffs every 6 hours as needed for cough, wheezing, or chest congestion  Prednisone 10 mg pill >> 2 pills daily for 1 week, then 1 pill daily for 1 week  Follow up in 6 weeks    Chesley Mires, MD [place] Pager: [phone] 11/15/2019, 12:04 PM  Flow Sheet     Pulmonary tests:  PFT 05/21/15 >> FEV1 1.70 (82%), FEV1% 83, DLCO 103% Bronchoscopy 11/07/19 >> 930 cells (67%N, 25%L, 5%E), benign lung tissue with TBx, atypical cells with BAL cytology  Serology:  10/25/19 >> HP panel negative, ANA negative, RF <  14, ESR 21  Chest imaging:  CT chest 06/20/15 >> atherosclerosis, fatty liver, lungs clear CT chest 10/20/19 >> atherosclerosis, scattered centrilobular GGO nodules in upper chest b/l up to 4 mm  Medications:   Allergies as of 11/15/2019      Reactions   Aleve [naproxen] Other (See Comments)   GI upset      Medication List       Accurate as of November 15, 2019 12:04 PM. If you have any questions, ask your nurse or doctor.        STOP taking these medications   montelukast 10 MG tablet Commonly known as: SINGULAIR Stopped by: Chesley Mires, MD      TAKE these medications   acetaminophen 650 MG CR tablet Commonly known as: TYLENOL Take 1,300 mg by mouth every 8 (eight) hours as needed for pain.   amLODipine 5 MG tablet Commonly known as: NORVASC Take 5 mg by mouth daily.   CALCIUM-MAGNESIUM-ZINC-D3 PO Take 2 tablets by mouth daily.   clobetasol cream 0.05 % Commonly known as: TEMOVATE Apply 1 application topically 2 (two) times daily as needed (irritation).   estradiol 0.1 MG/GM vaginal cream Commonly known as: ESTRACE Place 1 Applicatorful vaginally daily as needed (for vaginal discomfort/dryness).   folic acid 563 MCG tablet Commonly known as: FOLVITE Take 800 mcg by mouth daily.   hydroxychloroquine 200 MG tablet Commonly known as: PLAQUENIL Take 200 mg by mouth 2 (two) times daily.   levothyroxine 25 MCG tablet Commonly known as: SYNTHROID Take 25 mcg by mouth daily before breakfast.   methotrexate 2.5 MG tablet Commonly known as: RHEUMATREX Take 10 mg by mouth every Friday.   multivitamin with minerals Tabs tablet Take 1 tablet by mouth daily.   olmesartan 20 MG tablet Commonly known as: BENICAR Take 20 mg by mouth daily.   omeprazole 40 MG capsule Commonly known as: PRILOSEC Take 40 mg by mouth daily.   polyethylene glycol 17 g packet Commonly known as: MIRALAX / GLYCOLAX Take 17 g by mouth 4 (four) times a week.   predniSONE 10 MG tablet Commonly known as: DELTASONE Take 2 tablets (20 mg total) by mouth daily with breakfast for 7 days, THEN 1 tablet (10 mg total) daily with breakfast for 7 days. Start taking on: November 15, 2019 Started by: Chesley Mires, MD   PROBIOTIC DAILY PO Take 1 capsule by mouth daily.   REFRESH LIQUIGEL OP Apply 1 drop to eye at bedtime.   simvastatin 40 MG tablet Commonly known as: ZOCOR Take 40 mg by mouth daily.   Turmeric 500 MG Caps Take 1,000 mg by mouth daily.   Vitamin D 50 MCG (2000 UT) Caps Take 2,000 Units by mouth daily.       Past Surgical  History:  She  has a past surgical history that includes Tonsillectomy; Colonoscopy; Hemorroidectomy; Vaginal delivery; Appendectomy; laparoscopic appendectomy (N/A, 01/06/2017); Ventral hernia repair (N/A, 09/12/2018); Insertion of mesh (N/A, 09/12/2018); and Video bronchoscopy (Bilateral, 11/07/2019).  Family History:  Her family history includes Breast cancer in her daughter and maternal grandfather.  Social History:  She  reports that she has never smoked. She has never used smokeless tobacco. She reports current alcohol use. She reports that she does not use drugs.

## 2019-11-15 NOTE — Patient Instructions (Signed)
Stop using montelukast  Albuterol two puffs every 6 hours as needed for cough, wheezing, or chest congestion  Prednisone 10 mg pill >> 2 pills daily for 1 week, then 1 pill daily for 1 week  Follow up in 6 weeks

## 2019-11-22 ENCOUNTER — Telehealth: Payer: Self-pay | Admitting: Pulmonary Disease

## 2019-11-22 NOTE — Telephone Encounter (Signed)
Spoke with Charleston Ropes at [facility], states that the pt had called her after her last visit, states that Raquel Sarna had asked pt to present their red white and blue Medicare card, but Daisy explained that pts are not to bring that with them to appointments, and that that card should never be needed by our clinical staff for any reason.   If Raquel Sarna has any additional questions she may call Daisy directly. Forwarding to [place] as FYI.

## 2019-12-07 LAB — FUNGUS CULTURE WITH STAIN

## 2019-12-07 LAB — FUNGUS CULTURE RESULT

## 2019-12-07 LAB — FUNGAL ORGANISM REFLEX

## 2019-12-21 LAB — ACID FAST CULTURE WITH REFLEXED SENSITIVITIES (MYCOBACTERIA): Acid Fast Culture: NEGATIVE

## 2020-01-01 ENCOUNTER — Telehealth: Payer: Self-pay | Admitting: Pulmonary Disease

## 2020-01-01 NOTE — Telephone Encounter (Signed)
Please let her know her bronchoscopy culture for mycobacterial infections was negative.

## 2020-01-01 NOTE — Telephone Encounter (Signed)
Called the patient and made her aware of the results. Patient voiced understanding. Nothing further needed at this time.

## 2020-01-09 ENCOUNTER — Other Ambulatory Visit: Payer: Self-pay

## 2020-01-09 ENCOUNTER — Encounter: Payer: Self-pay | Admitting: Pulmonary Disease

## 2020-01-09 ENCOUNTER — Ambulatory Visit: Payer: Medicare Other | Admitting: Pulmonary Disease

## 2020-01-09 DIAGNOSIS — R918 Other nonspecific abnormal finding of lung field: Secondary | ICD-10-CM | POA: Diagnosis not present

## 2020-01-09 NOTE — Patient Instructions (Signed)
Will arrange for CT chest without contrast to be done in April 2021 and then follow up appointment after this is done

## 2020-01-09 NOTE — Progress Notes (Signed)
[facility], Critical Care, and Sleep Medicine  Chief Complaint  Patient presents with  . Follow-up    mild cough, some SOB    Constitutional:  Pulse 72   Temp 97.8 F (36.6 C) (Temporal)   Ht 5' 3.5" (1.613 m)   Wt 202 lb (91.6 kg)   SpO2 99% Comment: RA  BMI 35.22 kg/m   Past Medical History:  HTN, GERD, OA, RA, Anxiety, Hives  Brief Summary:  Rhonda [name] is a 79 y.o. female with chronic cough.  She was started on MTX again about a month ago.  Still has some joint soreness, but better.  Cough is better.  Still has occasional post nasal drip and raspy voice.  Not having fever, chest pain, sputum, or sweats.  AFB and fungal cultures from Bronchoscopy on 11/07/19 finalized and negative.  Respiratory Exam:   Appearance - well kempt   ENMT - no sinus tenderness, no nasal discharge, no oral exudate  Respiratory - normal appearance of chest wall, normal respiratory effort w/o accessory muscle use, no dullness on percussion, no wheezing or rales  CV - s1s2 regular rate and rhythm, no murmurs, no peripheral edema, radial pulses symmetric  Ext - no cyanosis, clubbing, no edema   Discussion:  She has recurrent cough.  She has hx of rheumatoid arthritis.  Recent CT chest shows changes of centrilobular ground glass nodules in upper lobes b/l.  Bronchoscopy was negative.  Fungal and AFB cultures still pending, but suspicion for infection is low.  CT chest findings likely related to rheumatoid arthritis.  She hasn't responded to therapy for asthma.  This seems less likely as cough of her symptoms.  She does have acid reflux and this could be contributing to her cough.   Assessment/Plan:   Cough with GGO nodules on CT chest. - likely from rheumatoid arthritis - cytology, AFB, and fungal cultures negative from bronchoscopy in November 2020 - improved since resume methotrexate - plan to repeat CT chest w/o contrast in April 2021  Upper airway cough with irritant  induced rhinitis and post nasal drip. - not much of an issue at present - prn OTC antihistamine  History of GERD. - continue prilosec from PCP  History of rheumatoid arthritis. - followed by Dr. Trudie Reed with rheumatology - resumed MTX about 1 month ago - continues on plaquenil  COVID 19 vaccination. - she has first dose schedule for later this week   Patient Instructions  Will arrange for CT chest without contrast to be done in April 2021 and then follow up appointment after this is done   A total of  22 minutes were spent face to face and non-face to face with the patient and more than half of that time involved counseling or coordination of care.   Chesley Mires, MD [facility] Pager: [phone] 01/09/2020, 11:15 AM  Flow Sheet     Pulmonary tests:  PFT 05/21/15 >> FEV1 1.70 (82%), FEV1% 83, DLCO 103% Bronchoscopy 11/07/19 >> 930 cells (67%N, 25%L, 5%E), benign lung tissue with TBx, atypical cells with BAL cytology  Serology:  10/25/19 >> HP panel negative, ANA negative, RF < 14, ESR 21  Chest imaging:  CT chest 06/20/15 >> atherosclerosis, fatty liver, lungs clear CT chest 10/20/19 >> atherosclerosis, scattered centrilobular GGO nodules in upper chest b/l up to 4 mm  Medications:   Allergies as of 01/09/2020      Reactions   Aleve [naproxen] Other (See Comments)   GI upset  Medication List       Accurate as of January 09, 2020 11:15 AM. If you have any questions, ask your nurse or doctor.        acetaminophen 650 MG CR tablet Commonly known as: TYLENOL Take 1,300 mg by mouth every 8 (eight) hours as needed for pain.   amLODipine 5 MG tablet Commonly known as: NORVASC Take 5 mg by mouth daily.   CALCIUM-MAGNESIUM-ZINC-D3 PO Take 2 tablets by mouth daily.   clobetasol cream 0.05 % Commonly known as: TEMOVATE Apply 1 application topically 2 (two) times daily as needed (irritation).   estradiol 0.1 MG/GM vaginal cream  Commonly known as: ESTRACE Place 1 Applicatorful vaginally daily as needed (for vaginal discomfort/dryness).   folic acid 119 MCG tablet Commonly known as: FOLVITE Take 800 mcg by mouth daily.   hydroxychloroquine 200 MG tablet Commonly known as: PLAQUENIL Take 200 mg by mouth 2 (two) times daily.   levothyroxine 25 MCG tablet Commonly known as: SYNTHROID Take 25 mcg by mouth daily before breakfast.   methotrexate 2.5 MG tablet Commonly known as: RHEUMATREX Take 10 mg by mouth every Friday.   multivitamin with minerals Tabs tablet Take 1 tablet by mouth daily.   olmesartan 20 MG tablet Commonly known as: BENICAR Take 20 mg by mouth daily.   omeprazole 40 MG capsule Commonly known as: PRILOSEC Take 40 mg by mouth daily.   polyethylene glycol 17 g packet Commonly known as: MIRALAX / GLYCOLAX Take 17 g by mouth 4 (four) times a week.   PROBIOTIC DAILY PO Take 1 capsule by mouth daily.   REFRESH LIQUIGEL OP Apply 1 drop to eye at bedtime.   simvastatin 40 MG tablet Commonly known as: ZOCOR Take 40 mg by mouth daily.   Turmeric 500 MG Caps Take 1,000 mg by mouth daily.   Vitamin D 50 MCG (2000 UT) Caps Take 2,000 Units by mouth daily.       Past Surgical History:  She  has a past surgical history that includes Tonsillectomy; Colonoscopy; Hemorroidectomy; Vaginal delivery; Appendectomy; laparoscopic appendectomy (N/A, 01/06/2017); Ventral hernia repair (N/A, 09/12/2018); Insertion of mesh (N/A, 09/12/2018); and Video bronchoscopy (Bilateral, 11/07/2019).  Family History:  Her family history includes Breast cancer in her daughter and maternal grandfather.  Social History:  She  reports that she has never smoked. She has never used smokeless tobacco. She reports current alcohol use. She reports that she does not use drugs.

## 2020-04-11 ENCOUNTER — Ambulatory Visit
Admission: RE | Admit: 2020-04-11 | Discharge: 2020-04-11 | Disposition: A | Discharge status: Home / Self Care | Payer: Medicare Other | Source: Ambulatory Visit | Attending: Pulmonary Disease | Admitting: Pulmonary Disease

## 2020-04-11 ENCOUNTER — Other Ambulatory Visit: Payer: Self-pay

## 2020-04-11 DIAGNOSIS — R918 Other nonspecific abnormal finding of lung field: Secondary | ICD-10-CM

## 2020-04-12 ENCOUNTER — Telehealth: Payer: Self-pay | Admitting: Pulmonary Disease

## 2020-04-12 NOTE — Telephone Encounter (Signed)
CT chest 04/11/20 >> previous GGO micronodularity resolved.  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  Changes of esophageal reflux, atherosclerosis.   Please let her know that CT chest shows previous area of nodules in upper lungs has resolved.  Will discuss in more detail at visit on 04/29/20.

## 2020-04-15 ENCOUNTER — Other Ambulatory Visit: Payer: Medicare Other

## 2020-04-17 NOTE — Telephone Encounter (Signed)
Called patient, someone answered the phone but did not say anything. Will attempt to call back later today.

## 2020-04-17 NOTE — Telephone Encounter (Signed)
Patient is returning phone call. Patient phone number is [phone].

## 2020-04-19 NOTE — Telephone Encounter (Signed)
If she is not having any other respiratory issues, then she can cancel appointment.

## 2020-04-19 NOTE — Telephone Encounter (Signed)
Spoke with patient. She is ok with cancelling the appointment. She is aware to call us if she needs anything.   Will cancel her appointment.   Nothing further needed at time of call.

## 2020-04-19 NOTE — Telephone Encounter (Signed)
Spoke with patient. She is aware of results and verbalized understanding.   She wants to know since her CT scan was normal, does she need to keep her follow up next month? She received a call earlier today to have her appointment with you cancelled and switched to [facility] on the following day.   Dr. Halford Chessman, please advise. Thanks!

## 2020-04-29 ENCOUNTER — Ambulatory Visit: Payer: Medicare Other | Admitting: Pulmonary Disease

## 2020-04-30 ENCOUNTER — Ambulatory Visit: Payer: Medicare Other | Admitting: Primary Care

## 2020-06-28 ENCOUNTER — Other Ambulatory Visit: Payer: Self-pay | Admitting: Internal Medicine

## 2020-06-28 DIAGNOSIS — Z1231 Encounter for screening mammogram for malignant neoplasm of breast: Secondary | ICD-10-CM

## 2020-07-11 ENCOUNTER — Other Ambulatory Visit: Payer: Self-pay

## 2020-07-11 ENCOUNTER — Ambulatory Visit
Admission: RE | Admit: 2020-07-11 | Discharge: 2020-07-11 | Disposition: A | Discharge status: Home / Self Care | Payer: Medicare Other | Source: Ambulatory Visit | Attending: Internal Medicine | Admitting: Internal Medicine

## 2020-07-11 DIAGNOSIS — Z1231 Encounter for screening mammogram for malignant neoplasm of breast: Secondary | ICD-10-CM

## 2020-09-13 ENCOUNTER — Emergency Department (HOSPITAL_COMMUNITY): Payer: Medicare Other

## 2020-09-13 ENCOUNTER — Inpatient Hospital Stay (HOSPITAL_COMMUNITY)
Admission: EM | Admit: 2020-09-13 | Discharge: 2020-09-16 | DRG: 522 | Disposition: A | Discharge status: Home Health | Payer: Medicare Other | Attending: Internal Medicine | Admitting: Internal Medicine

## 2020-09-13 DIAGNOSIS — M12552 Traumatic arthropathy, left hip: Secondary | ICD-10-CM | POA: Diagnosis not present

## 2020-09-13 DIAGNOSIS — Z79899 Other long term (current) drug therapy: Secondary | ICD-10-CM

## 2020-09-13 DIAGNOSIS — D62 Acute posthemorrhagic anemia: Secondary | ICD-10-CM | POA: Diagnosis present

## 2020-09-13 DIAGNOSIS — S72002A Fracture of unspecified part of neck of left femur, initial encounter for closed fracture: Secondary | ICD-10-CM | POA: Diagnosis present

## 2020-09-13 DIAGNOSIS — I10 Essential (primary) hypertension: Secondary | ICD-10-CM | POA: Diagnosis present

## 2020-09-13 DIAGNOSIS — W1830XA Fall on same level, unspecified, initial encounter: Secondary | ICD-10-CM | POA: Diagnosis present

## 2020-09-13 DIAGNOSIS — Z96649 Presence of unspecified artificial hip joint: Secondary | ICD-10-CM

## 2020-09-13 DIAGNOSIS — Z886 Allergy status to analgesic agent status: Secondary | ICD-10-CM

## 2020-09-13 DIAGNOSIS — Z6834 Body mass index (BMI) 34.0-34.9, adult: Secondary | ICD-10-CM

## 2020-09-13 DIAGNOSIS — Z79890 Hormone replacement therapy (postmenopausal): Secondary | ICD-10-CM

## 2020-09-13 DIAGNOSIS — M069 Rheumatoid arthritis, unspecified: Secondary | ICD-10-CM | POA: Diagnosis present

## 2020-09-13 DIAGNOSIS — Z20822 Contact with and (suspected) exposure to covid-19: Secondary | ICD-10-CM | POA: Diagnosis present

## 2020-09-13 DIAGNOSIS — E039 Hypothyroidism, unspecified: Secondary | ICD-10-CM | POA: Diagnosis present

## 2020-09-13 DIAGNOSIS — E669 Obesity, unspecified: Secondary | ICD-10-CM | POA: Diagnosis present

## 2020-09-13 DIAGNOSIS — F419 Anxiety disorder, unspecified: Secondary | ICD-10-CM | POA: Diagnosis present

## 2020-09-13 DIAGNOSIS — Z23 Encounter for immunization: Secondary | ICD-10-CM

## 2020-09-13 DIAGNOSIS — Z419 Encounter for procedure for purposes other than remedying health state, unspecified: Secondary | ICD-10-CM

## 2020-09-13 DIAGNOSIS — K219 Gastro-esophageal reflux disease without esophagitis: Secondary | ICD-10-CM | POA: Diagnosis present

## 2020-09-13 LAB — CBC WITH DIFFERENTIAL/PLATELET
Abs Immature Granulocytes: 0.08 10*3/uL — ABNORMAL HIGH (ref 0.00–0.07)
Basophils Absolute: 0.1 10*3/uL (ref 0.0–0.1)
Basophils Relative: 1 %
Eosinophils Absolute: 0 10*3/uL (ref 0.0–0.5)
Eosinophils Relative: 0 %
HCT: 33.4 % — ABNORMAL LOW (ref 36.0–46.0)
Hemoglobin: 11 g/dL — ABNORMAL LOW (ref 12.0–15.0)
Immature Granulocytes: 1 %
Lymphocytes Relative: 6 %
Lymphs Abs: 0.8 10*3/uL (ref 0.7–4.0)
MCH: 32.3 pg (ref 26.0–34.0)
MCHC: 32.9 g/dL (ref 30.0–36.0)
MCV: 97.9 fL (ref 80.0–100.0)
Monocytes Absolute: 1.2 10*3/uL — ABNORMAL HIGH (ref 0.1–1.0)
Monocytes Relative: 8 %
Neutro Abs: 11.7 10*3/uL — ABNORMAL HIGH (ref 1.7–7.7)
Neutrophils Relative %: 84 %
Platelets: 400 10*3/uL (ref 150–400)
RBC: 3.41 MIL/uL — ABNORMAL LOW (ref 3.87–5.11)
RDW: 13.8 % (ref 11.5–15.5)
WBC: 13.7 10*3/uL — ABNORMAL HIGH (ref 4.0–10.5)
nRBC: 0 % (ref 0.0–0.2)

## 2020-09-13 LAB — PROTIME-INR
INR: 1.1 (ref 0.8–1.2)
Prothrombin Time: 13.4 seconds (ref 11.4–15.2)

## 2020-09-13 LAB — TYPE AND SCREEN
ABO/RH(D): A POS
Antibody Screen: NEGATIVE

## 2020-09-13 LAB — BASIC METABOLIC PANEL
Anion gap: 12 (ref 5–15)
BUN: 16 mg/dL (ref 8–23)
CO2: 22 mmol/L (ref 22–32)
Calcium: 9.3 mg/dL (ref 8.9–10.3)
Chloride: 99 mmol/L (ref 98–111)
Creatinine, Ser: 1.06 mg/dL — ABNORMAL HIGH (ref 0.44–1.00)
GFR calc Af Amer: 57 mL/min — ABNORMAL LOW (ref >60)
GFR calc non Af Amer: 50 mL/min — ABNORMAL LOW (ref >60)
Glucose, Bld: 142 mg/dL — ABNORMAL HIGH (ref 70–99)
Potassium: 4.3 mmol/L (ref 3.5–5.1)
Sodium: 133 mmol/L — ABNORMAL LOW (ref 135–145)

## 2020-09-13 LAB — SARS CORONAVIRUS 2 BY RT PCR (HOSPITAL ORDER, PERFORMED IN ~~LOC~~ HOSPITAL LAB): SARS Coronavirus 2: NEGATIVE

## 2020-09-13 MED ORDER — ACETAMINOPHEN 325 MG PO TABS
650.0000 mg | ORAL_TABLET | Freq: Four times a day (QID) | ORAL | Status: DC | PRN
  Administered 2020-09-14: 650 mg via ORAL
  Filled 2020-09-13: qty 2

## 2020-09-13 MED ORDER — PAROXETINE HCL 20 MG PO TABS
20.0000 mg | ORAL_TABLET | Freq: Every day | ORAL | Status: DC
  Administered 2020-09-14 – 2020-09-16 (×3): 20 mg via ORAL
  Filled 2020-09-13 (×3): qty 1

## 2020-09-13 MED ORDER — PANTOPRAZOLE SODIUM 40 MG PO TBEC
40.0000 mg | DELAYED_RELEASE_TABLET | Freq: Every day | ORAL | Status: DC
  Administered 2020-09-14 – 2020-09-16 (×3): 40 mg via ORAL
  Filled 2020-09-13 (×3): qty 1

## 2020-09-13 MED ORDER — METHOTREXATE 2.5 MG PO TABS: 12.5000 mg | ORAL_TABLET | ORAL | Status: DC

## 2020-09-13 MED ORDER — ONDANSETRON HCL 4 MG/2ML IJ SOLN: 4.0000 mg | Freq: Four times a day (QID) | INTRAMUSCULAR | Status: DC | PRN

## 2020-09-13 MED ORDER — MORPHINE SULFATE (PF) 4 MG/ML IV SOLN
4.0000 mg | INTRAVENOUS | Status: DC | PRN
  Administered 2020-09-13: 4 mg via INTRAVENOUS
  Filled 2020-09-13: qty 1

## 2020-09-13 MED ORDER — HYDROMORPHONE HCL 1 MG/ML IJ SOLN
0.5000 mg | INTRAMUSCULAR | Status: DC | PRN
  Administered 2020-09-14: 0.5 mg via INTRAVENOUS
  Filled 2020-09-13: qty 0.5

## 2020-09-13 MED ORDER — TRANEXAMIC ACID-NACL 1000-0.7 MG/100ML-% IV SOLN
1000.0000 mg | INTRAVENOUS | Status: AC
  Administered 2020-09-14: 1000 mg via INTRAVENOUS
  Filled 2020-09-13: qty 100

## 2020-09-13 MED ORDER — POVIDONE-IODINE 10 % EX SWAB: 2.0000 "application " | Freq: Once | CUTANEOUS | Status: DC

## 2020-09-13 MED ORDER — CEFAZOLIN SODIUM-DEXTROSE 2-4 GM/100ML-% IV SOLN
2.0000 g | INTRAVENOUS | Status: AC
  Administered 2020-09-14: 2 g via INTRAVENOUS
  Filled 2020-09-13: qty 100

## 2020-09-13 MED ORDER — SODIUM CHLORIDE 0.9 % IV SOLN: INTRAVENOUS | Status: DC

## 2020-09-13 MED ORDER — ONDANSETRON HCL 4 MG PO TABS: 4.0000 mg | ORAL_TABLET | Freq: Four times a day (QID) | ORAL | Status: DC | PRN

## 2020-09-13 MED ORDER — CHLORHEXIDINE GLUCONATE 4 % EX LIQD: 60.0000 mL | Freq: Once | CUTANEOUS | Status: DC

## 2020-09-13 MED ORDER — ACETAMINOPHEN 650 MG RE SUPP: 650.0000 mg | Freq: Four times a day (QID) | RECTAL | Status: DC | PRN

## 2020-09-13 MED ORDER — FOLIC ACID 1 MG PO TABS
1000.0000 ug | ORAL_TABLET | Freq: Every day | ORAL | Status: DC
  Administered 2020-09-15 – 2020-09-16 (×2): 1 mg via ORAL
  Filled 2020-09-13 (×2): qty 1

## 2020-09-13 MED ORDER — HYDROMORPHONE HCL 1 MG/ML IJ SOLN
0.5000 mg | Freq: Once | INTRAMUSCULAR | Status: AC
  Administered 2020-09-13: 0.5 mg via INTRAVENOUS
  Filled 2020-09-13: qty 1

## 2020-09-13 MED ORDER — MORPHINE SULFATE (PF) 4 MG/ML IV SOLN
4.0000 mg | Freq: Once | INTRAVENOUS | Status: AC
  Administered 2020-09-13: 4 mg via INTRAVENOUS
  Filled 2020-09-13: qty 1

## 2020-09-13 MED ORDER — HYDROXYCHLOROQUINE SULFATE 200 MG PO TABS
200.0000 mg | ORAL_TABLET | Freq: Two times a day (BID) | ORAL | Status: DC
  Administered 2020-09-14 – 2020-09-16 (×5): 200 mg via ORAL
  Filled 2020-09-13 (×7): qty 1

## 2020-09-13 MED ORDER — ONDANSETRON HCL 4 MG/2ML IJ SOLN
4.0000 mg | Freq: Once | INTRAMUSCULAR | Status: AC
  Administered 2020-09-13: 4 mg via INTRAVENOUS
  Filled 2020-09-13: qty 2

## 2020-09-13 MED ORDER — SIMVASTATIN 20 MG PO TABS
40.0000 mg | ORAL_TABLET | Freq: Every day | ORAL | Status: DC
  Administered 2020-09-14 – 2020-09-15 (×2): 40 mg via ORAL
  Filled 2020-09-13 (×2): qty 2

## 2020-09-13 MED ORDER — LEVOTHYROXINE SODIUM 25 MCG PO TABS
25.0000 ug | ORAL_TABLET | Freq: Every day | ORAL | Status: DC
  Administered 2020-09-15 – 2020-09-16 (×2): 25 ug via ORAL
  Filled 2020-09-13 (×2): qty 1

## 2020-09-13 NOTE — ED Triage Notes (Signed)
Patient c/o falling down steps onto back and Lt leg. C/O Lt lateral thigh pain since fall, was able to stand but unable to bear weight on same. +pedal pulses. No rotation/shortening noted. AAOx4. No head/neck trauma. No LOC.

## 2020-09-13 NOTE — ED Provider Notes (Addendum)
[facility] EMERGENCY DEPARTMENT Provider Note   CSN: [id number] Arrival date & time: 09/13/20  1916     History Chief Complaint  Patient presents with  . Fall    Rhonda [name] is a 79 y.o. female.  Patient with history of high blood pressure, rheumatoid arthritis --presents to the emergency department for evaluation of left hip and thigh pain.  Patient fell while on a porch just prior to arrival.  She fell backwards and onto the left hip area.  She had immediate severe pain.  She did not hit her head or lose consciousness.  Family attempted to mobilize her on scene, however she was unable to bear weight on the leg and required EMS transport to the hospital.  Patient was given 100 mcg of fentanyl in route.  She denies back pain, chest pain or abdominal pain.  No anticoagulation.  Onset of symptoms acute.  Course is constant.  Pain is worse with palpation and movement.        Past Medical History:  Diagnosis Date  . Anxiety    pt. may have panicy feeling upon awaking from anesth. - states she feels anxious at times  & has used Paxil & xanax in the [place] but its been a very long time ago   . Arthritis    rheumatoid & OA-hands, shoulders   . Dyspnea   . GERD (gastroesophageal reflux disease)   . Heart murmur   . Hypertension     Patient Active Problem List   Diagnosis Date Noted  . 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 Upper airway cough syndrome 11/11/2016    Past Surgical History:  Procedure Laterality Date  . APPENDECTOMY     laproscopic  . COLONOSCOPY    . HEMORROIDECTOMY    . INSERTION OF MESH N/A 09/12/2018   Procedure: INSERTION OF MESH;  Surgeon: Jovita Kussmaul, MD;  Location: [facility];  Service: General;  Laterality: N/A;  . LAPAROSCOPIC APPENDECTOMY N/A 01/06/2017   Procedure: LAPAROSCOPIC APPENDECTOMY;  Surgeon: Autumn Messing III, MD;  Location: [facility];  Service: General;  Laterality: N/A;  . TONSILLECTOMY     as a child  . VAGINAL DELIVERY     x2  .  VENTRAL HERNIA REPAIR N/A 09/12/2018   Procedure: LAPAROSCOPIC VENTRAL HERNIA REPAIR WITH MESH;  Surgeon: Jovita Kussmaul, MD;  Location: [facility];  Service: General;  Laterality: N/A;  . VIDEO BRONCHOSCOPY Bilateral 11/07/2019   Procedure: VIDEO BRONCHOSCOPY WITH FLUORO;  Surgeon: Chesley Mires, MD;  Location: [facility];  Service: Endoscopy;  Laterality: Bilateral;     OB History   No obstetric history on file.     Family History  Problem Relation Age of Onset  . Breast cancer Daughter   . Breast cancer Maternal Grandfather     Social History   Tobacco Use  . Smoking status: Never Smoker  . Smokeless tobacco: Never Used  Substance Use Topics  . Alcohol use: Yes    Comment: daily- wine & bourbon & ginger   . Drug use: No    Home Medications Prior to Admission medications   Medication Sig Start Date End Date Taking? Authorizing Provider  acetaminophen (TYLENOL) 650 MG CR tablet Take 1,300 mg by mouth every 8 (eight) hours as needed for pain.    [provider]  amLODipine (NORVASC) 5 MG tablet Take 5 mg by mouth daily. 10/20/19   [provider]  Carboxymethylcellulose Sodium (REFRESH LIQUIGEL OP) Apply 1 drop  to eye at bedtime.    [provider]  Cholecalciferol (VITAMIN D) 2000 units CAPS Take 2,000 Units by mouth daily.     [provider]  clobetasol cream (TEMOVATE) 1.22 % Apply 1 application topically 2 (two) times daily as needed (irritation).    [provider]  estradiol (ESTRACE) 0.1 MG/GM vaginal cream Place 1 Applicatorful vaginally daily as needed (for vaginal discomfort/dryness).     [provider]  folic acid (FOLVITE) 482 MCG tablet Take 800 mcg by mouth daily.     [provider]  hydroxychloroquine (PLAQUENIL) 200 MG tablet Take 200 mg by mouth 2 (two) times daily. 09/16/16   [provider]  levothyroxine (SYNTHROID, LEVOTHROID) 25 MCG tablet Take 25 mcg by mouth daily before breakfast.   09/16/16   [provider]  methotrexate (RHEUMATREX) 2.5 MG tablet Take 10 mg by mouth every Friday. 10/04/16   [provider]  Multiple Minerals-Vitamins (CALCIUM-MAGNESIUM-ZINC-D3 PO) Take 2 tablets by mouth daily.    [provider]  Multiple Vitamin (MULTIVITAMIN WITH MINERALS) TABS tablet Take 1 tablet by mouth daily.    [provider]  olmesartan (BENICAR) 20 MG tablet Take 20 mg by mouth daily.     [provider]  omeprazole (PRILOSEC) 40 MG capsule Take 40 mg by mouth daily. 09/16/16   [provider]  polyethylene glycol (MIRALAX / GLYCOLAX) packet Take 17 g by mouth 4 (four) times a week.     [provider]  Probiotic Product (PROBIOTIC DAILY PO) Take 1 capsule by mouth daily.    [provider]  simvastatin (ZOCOR) 40 MG tablet Take 40 mg by mouth daily.  09/16/16   [provider]  Turmeric 500 MG CAPS Take 1,000 mg by mouth daily.     [provider]    Allergies    Aleve [naproxen]  Review of Systems   Review of Systems  Constitutional: Negative for fever.  HENT: Negative for rhinorrhea and sore throat.   Eyes: Negative for redness.  Respiratory: Negative for cough.   Cardiovascular: Negative for chest pain.  Gastrointestinal: Negative for abdominal pain, diarrhea, nausea and vomiting.  Genitourinary: Negative for dysuria, frequency, hematuria and urgency.  Musculoskeletal: Positive for arthralgias, gait problem and myalgias.  Skin: Negative for rash.  Neurological: Negative for headaches.    Physical Exam Updated Vital Signs BP (!) 144/95 (BP Location: Right Arm)   Pulse 85   Temp 97.7 F (36.5 C) (Oral)   Resp 16   SpO2 96%   Physical Exam Vitals and nursing note reviewed.  Constitutional:      General: She is not in acute distress.    Appearance: She is well-developed.  HENT:     Head: Normocephalic and atraumatic.     Right Ear: External ear normal.     Left Ear:  External ear normal.     Nose: Nose normal.  Eyes:     Conjunctiva/sclera: Conjunctivae normal.  Cardiovascular:     Rate and Rhythm: Normal rate and regular rhythm.     Pulses:          Dorsalis pedis pulses are 2+ on the right side and 2+ on the left side.     Heart sounds: No murmur heard.   Pulmonary:     Effort: No respiratory distress.     Breath sounds: No wheezing, rhonchi or rales.  Abdominal:     Palpations: Abdomen is soft.     Tenderness: There  is no abdominal tenderness. There is no guarding or rebound.  Musculoskeletal:     Cervical back: Normal range of motion and neck supple.     Right hip: No tenderness or bony tenderness. Normal range of motion.     Left hip: Tenderness and bony tenderness present. Decreased range of motion.     Left upper leg: Tenderness present. No swelling, edema or bony tenderness.     Right knee: No bony tenderness. Normal range of motion. No tenderness.     Left knee: No bony tenderness. Normal range of motion. No tenderness.     Right lower leg: No edema.     Left lower leg: No edema.     Left ankle: No tenderness. Normal range of motion.  Skin:    General: Skin is warm and dry.     Findings: No rash.  Neurological:     General: No focal deficit present.     Mental Status: She is alert. Mental status is at baseline.     Motor: No weakness.  Psychiatric:        Mood and Affect: Mood normal.     ED Results / Procedures / Treatments   Labs (all labs ordered are listed, but only abnormal results are displayed) Labs Reviewed  BASIC METABOLIC PANEL - Abnormal; Notable for the following components:      Result Value   Sodium 133 (*)    Glucose, Bld 142 (*)    Creatinine, Ser 1.06 (*)    GFR calc non Af Amer 50 (*)    GFR calc Af Amer 57 (*)    All other components within normal limits  CBC WITH DIFFERENTIAL/PLATELET - Abnormal; Notable for the following components:   WBC 13.7 (*)    RBC 3.41 (*)    Hemoglobin 11.0 (*)    HCT  33.4 (*)    Neutro Abs 11.7 (*)    Monocytes Absolute 1.2 (*)    Abs Immature Granulocytes 0.08 (*)    All other components within normal limits  SARS CORONAVIRUS 2 BY RT PCR (HOSPITAL ORDER, [facility])  PROTIME-INR  TYPE AND SCREEN  ABO/RH    EKG EKG Interpretation  Date/Time:  Friday September 13 2020 21:16:04 EDT Ventricular Rate:  91 PR Interval:    QRS Duration: 92 QT Interval:  386 QTC Calculation: 475 R Axis:   23 Text Interpretation: Ectopic atrial rhythm Confirmed by Dene Gentry [phone]) on 09/13/2020 9:18:16 PM   Radiology DG Chest Port 1 View  Result Date: 09/13/2020 CLINICAL DATA:  79 year old female with hip fracture. EXAM: PORTABLE CHEST 1 VIEW COMPARISON:  Chest CT 04/11/2020 and earlier. FINDINGS: Portable AP semi upright view at 2105 hours. Stable lung volumes. Stable moderate elevation of the right hemidiaphragm. Mediastinal contours remain normal. Visualized tracheal air column is within normal limits. Allowing for portable technique the lungs are clear. No acute osseous abnormality identified. IMPRESSION: No acute cardiopulmonary abnormality. Electronically Signed   By: Genevie Makaylynn M.D.   On: 09/13/2020 21:17   DG Hip Unilat W or Wo Pelvis 2-3 Views Left  Result Date: 09/13/2020 CLINICAL DATA:  79 year old female status post fall with left hip and thigh pain. EXAM: DG HIP (WITH OR WITHOUT PELVIS) 2-3V LEFT COMPARISON:  CT Abdomen and Pelvis 12/15/2018. FINDINGS: Both femoral heads remain normally located but there is an acute left femoral neck fracture. The fracture is mildly impacted, mild varus angulation. The intertrochanteric segment appears to remain intact. No  superimposed fracture of the pelvis is identified. Grossly intact proximal right femur. Negative visible lower abdominal and pelvic visceral contours. IMPRESSION: Acute left femoral neck fracture with mild varus impaction. Electronically Signed   By: Genevie Naviyah M.D.   On: 09/13/2020  20:53    Procedures Procedures (including critical care time)  Medications Ordered in ED Medications  morphine 4 MG/ML injection 4 mg (4 mg Intravenous Given 09/13/20 2135)  morphine 4 MG/ML injection 4 mg (4 mg Intravenous Given 09/13/20 1940)  ondansetron (ZOFRAN) injection 4 mg (4 mg Intravenous Given 09/13/20 1939)    ED Course  I have reviewed the triage vital signs and the nursing notes.  Pertinent labs & imaging results that were available during my care of the patient were reviewed by me and considered in my medical decision making (see chart for details).  Patient seen and examined. Work-up initiated. Medications ordered. Pt discussed with and seen by Dr. Francia Greaves.   Vital signs reviewed and are as follows: BP (!) 144/95 (BP Location: Right Arm)   Pulse 85   Temp 97.7 F (36.5 C) (Oral)   Resp 16   SpO2 96%   8:59 PM X-ray reviewed. Pt with femoral neck fracture. She was updated. Additional pain medication requested. Advised of the need for admission.   10:30 PM Spoke with Dr. Marlou Sa. Plan for surgery in AM. Requests no Lovenox prior to surgery.   Pt updated, daughter at bedside. Exam is unchanged.   Will discuss with hospitalist for admission.   10:43 PM Spoke with Dr. Denton Brick who will see for admission.     MDM Rules/Calculators/A&P                          Admit for hip fracture.   Final Clinical Impression(s) / ED Diagnoses Final diagnoses:  Closed fracture of neck of left femur, initial encounter [facility])    Rx / DC Orders ED Discharge Orders    None        Carlisle Cater, PA-C 09/13/20 2243    Valarie Merino, MD 09/16/20 [phone]

## 2020-09-13 NOTE — ED Notes (Signed)
Pt states that pain "comes & goes," rates it at 7-8/10 when "coming," & a "2-3/10" when "going."

## 2020-09-13 NOTE — H&P (Addendum)
History and Physical    ELVIA AYDIN NOM:[id number] DOB: February 09, 1941 DOA: 09/13/2020  PCP: Leeroy Cha, MD   Patient coming from: Home  I have personally briefly reviewed patient's old medical records in [place]  Chief Complaint: Fall  HPI: Rhonda [name] is a 79 y.o. female with medical history significant for rheumatoid arthritis, hypothyroidism, hypertension. Patient presented to the ED with complaints of a fall that happened earlier this evening.  Patient was on her porch, when she was suddenly startled by her cat rushing by, she had alrealdy been nervous and jumpy because she had seen a snake eat a bunny.  She fell backward onto her left hip area and buttock.  She did not hit her head, she did not lose consciousness.  Fall was witnessed by spouse who was nearby. No chest pain no difficulty breathing.  No dizziness.  She has maintained good oral intake, no vomiting no loose stools.  ED Course: Stable vitals.  WBC 13.7.  Sodium 133 otherwise unremarkable CBC BMP.  EKG shows ectopic atrial rhythm.  Pelvic x-ray shows acute left femoral neck fracture with mild varus impaction.  Portable chest x-ray was without acute abnormality. EDP talked to orthopedist on-call, Dr. Marlou Sa- plan for surgery tomorrow, admit to hospitalist service.  Review of Systems: As per HPI all other systems reviewed and negative.  Past Medical History:  Diagnosis Date  . Anxiety    pt. may have panicy feeling upon awaking from anesth. - states she feels anxious at times  & has used Paxil & xanax in the [place] but its been a very long time ago   . Arthritis    rheumatoid & OA-hands, shoulders   . Dyspnea   . GERD (gastroesophageal reflux disease)   . Heart murmur   . Hypertension     Past Surgical History:  Procedure Laterality Date  . APPENDECTOMY     laproscopic  . COLONOSCOPY    . HEMORROIDECTOMY    . INSERTION OF MESH N/A 09/12/2018   Procedure: INSERTION OF MESH;  Surgeon: Jovita Kussmaul, MD;  Location: [facility];  Service: General;  Laterality: N/A;  . LAPAROSCOPIC APPENDECTOMY N/A 01/06/2017   Procedure: LAPAROSCOPIC APPENDECTOMY;  Surgeon: Autumn Messing III, MD;  Location: [facility];  Service: General;  Laterality: N/A;  . TONSILLECTOMY     as a child  . VAGINAL DELIVERY     x2  . VENTRAL HERNIA REPAIR N/A 09/12/2018   Procedure: LAPAROSCOPIC VENTRAL HERNIA REPAIR WITH MESH;  Surgeon: Jovita Kussmaul, MD;  Location: [facility];  Service: General;  Laterality: N/A;  . VIDEO BRONCHOSCOPY Bilateral 11/07/2019   Procedure: VIDEO BRONCHOSCOPY WITH FLUORO;  Surgeon: Chesley Mires, MD;  Location: [facility];  Service: Endoscopy;  Laterality: Bilateral;     reports that she has never smoked. She has never used smokeless tobacco. She reports current alcohol use. She reports that she does not use drugs.  Allergies  Allergen Reactions  . Aleve [Naproxen] Other (See Comments)    GI upset    Family History  Problem Relation Age of Onset  . Breast cancer Daughter   . Breast cancer Maternal Grandfather     Prior to Admission medications   Medication Sig Start Date End Date Taking? Authorizing Provider  acetaminophen (TYLENOL) 650 MG CR tablet Take 1,300 mg by mouth every 8 (eight) hours as needed for pain.   Yes [provider]  amLODipine (NORVASC) 5 MG tablet Take 5 mg by mouth  daily. 10/20/19  Yes [provider]  Carboxymethylcellulose Sodium (REFRESH LIQUIGEL) 1 % GEL Place 1 drop into both eyes at bedtime.   Yes [provider]  Cholecalciferol (VITAMIN D) 2000 units CAPS Take 2,000 Units by mouth daily.    Yes [provider]  clobetasol cream (TEMOVATE) 8.46 % Apply 1 application topically 2 (two) times daily as needed (irritation).   Yes [provider]  diclofenac Sodium (VOLTAREN) 1 % GEL Apply 1 application topically 2 (two) times daily as needed (arthritis pain).   Yes [provider]  estradiol (ESTRACE) 0.1 MG/GM vaginal  cream Place 1 Applicatorful vaginally daily as needed (for vaginal discomfort/dryness).    Yes [provider]  folic acid (FOLVITE) 962 MCG tablet Take 800 mcg by mouth daily.    Yes [provider]  Glucosamine-Chondroitin (MOVE FREE PO) Take 1 capsule by mouth 2 (two) times daily.   Yes [provider]  hydroxychloroquine (PLAQUENIL) 200 MG tablet Take 200 mg by mouth 2 (two) times daily. 09/16/16  Yes [provider]  levothyroxine (SYNTHROID, LEVOTHROID) 25 MCG tablet Take 25 mcg by mouth daily before breakfast.  09/16/16  Yes [provider]  melatonin 5 MG TABS Take 5 mg by mouth at bedtime.   Yes [provider]  methotrexate (RHEUMATREX) 2.5 MG tablet Take 12.5 mg by mouth every Friday.  10/04/16  Yes [provider]  Multiple Minerals-Vitamins (CALCIUM-MAGNESIUM-ZINC-D3 PO) Take 1 tablet by mouth 2 (two) times daily.    Yes [provider]  Multiple Vitamin (MULTIVITAMIN WITH MINERALS) TABS tablet Take 1 tablet by mouth daily.   Yes [provider]  olmesartan (BENICAR) 20 MG tablet Take 20 mg by mouth daily.    Yes [provider]  omeprazole (PRILOSEC) 40 MG capsule Take 40 mg by mouth daily. 09/16/16  Yes [provider]  PARoxetine (PAXIL) 20 MG tablet Take 20 mg by mouth daily. 08/30/20  Yes [provider]  polyethylene glycol (MIRALAX / GLYCOLAX) packet Take 17 g by mouth daily.    Yes [provider]  Polyvinyl Alcohol-Povidone PF (REFRESH) 1.4-0.6 % SOLN Place 1 drop into both eyes daily.   Yes [provider]  Probiotic Product (PROBIOTIC DAILY PO) Take 1 capsule by mouth daily.   Yes [provider]  simvastatin (ZOCOR) 40 MG tablet Take 40 mg by mouth at bedtime.  09/16/16  Yes [provider]  TURMERIC PO Take 2 capsules by mouth daily.    Yes [provider]    Physical Exam: Vitals:   09/13/20 2030 09/13/20 2100 09/13/20 2115  09/13/20 2138  BP:  (!) 111/91 118/61 (!) 109/58  Pulse: 88 90 91 95  Resp: 19  15 18   Temp:      TempSrc:      SpO2: 95% 95% 95% 95%    Constitutional: NAD, calm, comfortable Vitals:   09/13/20 2030 09/13/20 2100 09/13/20 2115 09/13/20 2138  BP:  (!) 111/91 118/61 (!) 109/58  Pulse: 88 90 91 95  Resp: 19  15 18   Temp:      TempSrc:      SpO2: 95% 95% 95% 95%   Eyes: PERRL, lids and conjunctivae normal ENMT: Mucous membranes are moist. Posterior pharynx clear of any exudate or lesions.Normal dentition.  Neck: normal, supple, no masses, no thyromegaly Respiratory: clear to auscultation bilaterally, no wheezing, no crackles. Normal respiratory effort. No accessory muscle use.  Cardiovascular: Regular rate and rhythm, 3/6 systolic murmurs,  loudest pulmonary area. No extremity edema. 2+ pedal pulses.   Abdomen: no tenderness, no masses palpated. No hepatosplenomegaly. Bowel sounds positive.  Musculoskeletal: no clubbing / cyanosis. No joint deformity upper and lower extremities. Good ROM, no contractures. Normal muscle tone.  Skin: no rashes, lesions, ulcers. No induration Neurologic: No apparent cranial abnormality.  Strength intact in all extremities, left lower extremity not tested due to pain/fracture. psychiatric: Normal judgment and insight. Alert and oriented x 3. Normal mood.   Labs on Admission: I have personally reviewed following labs and imaging studies  CBC: Recent Labs  Lab 09/13/20 2054  WBC 13.7*  NEUTROABS 11.7*  HGB 11.0*  HCT 33.4*  MCV 97.9  PLT 161   Basic Metabolic Panel: Recent Labs  Lab 09/13/20 2054  NA 133*  K 4.3  CL 99  CO2 22  GLUCOSE 142*  BUN 16  CREATININE 1.06*  CALCIUM 9.3   Coagulation Profile: Recent Labs  Lab 09/13/20 2054  INR 1.1    Radiological Exams on Admission: DG Chest Port 1 View  Result Date: 09/13/2020 CLINICAL DATA:  79 year old female with hip fracture. EXAM: PORTABLE CHEST 1 VIEW COMPARISON:  Chest CT  04/11/2020 and earlier. FINDINGS: Portable AP semi upright view at 2105 hours. Stable lung volumes. Stable moderate elevation of the right hemidiaphragm. Mediastinal contours remain normal. Visualized tracheal air column is within normal limits. Allowing for portable technique the lungs are clear. No acute osseous abnormality identified. IMPRESSION: No acute cardiopulmonary abnormality. Electronically Signed   By: Genevie Rafeef M.D.   On: 09/13/2020 21:17   DG Hip Unilat W or Wo Pelvis 2-3 Views Left  Result Date: 09/13/2020 CLINICAL DATA:  79 year old female status post fall with left hip and thigh pain. EXAM: DG HIP (WITH OR WITHOUT PELVIS) 2-3V LEFT COMPARISON:  CT Abdomen and Pelvis 12/15/2018. FINDINGS: Both femoral heads remain normally located but there is an acute left femoral neck fracture. The fracture is mildly impacted, mild varus angulation. The intertrochanteric segment appears to remain intact. No superimposed fracture of the pelvis is identified. Grossly intact proximal right femur. Negative visible lower abdominal and pelvic visceral contours. IMPRESSION: Acute left femoral neck fracture with mild varus impaction. Electronically Signed   By: Genevie Carolee M.D.   On: 09/13/2020 20:53    EKG: Independently reviewed.  Ectopic sinus rhythm, rate 91, QTc 475.  No significant ST-T wave changes compared to prior EKG.  Assessment/Plan Principal Problem:   Closed left hip fracture (HCC) Active Problems:   HTN (hypertension)   Rheumatoid arthritis (HCC)   Hypothyroidism   Closed left hip fracture-sustained from mechanical fall, pelvic x-ray showing left femoral neck fracture with mild varus impaction. -N.p.o. midnight -No improvement in pain with morphine, will use Dilaudid 0.5 every 4 hourly as needed - EDP talked to orthopedist on-call Dr. Marlou Sa, plan for surgery tomorrow. - D 5 N/s 50cc/hr x 15 hs  Rheumatoid arthritis-stable. -Resume weekly methotrexate, daily Plaquenil.  Hypertension-blood  pressure initially stable now soft likely from pain medications. -Hold Norvasc and losartan for now, consider resuming in the morning  Hypothyroidism -Resume Synthroid  Anxiety -Resume paroxetine  DVT prophylaxis: SCDs Code Status: Full code, confirmed with patient and daughter at bedside. Family Communication: Daughter at bedside. Disposition Plan:  > 2 days Consults called: Orthopedics Admission status: Inpatient, MedSurg I certify that at the point of admission it is my clinical judgment that the patient will require inpatient hospital care spanning beyond 2 midnights from the point of admission due  to high intensity of service, high risk for further deterioration and high frequency of surveillance required. The following factors support the patient status of inpatient: Requiring hospitalization for surgery.   Bethena Roys MD [facility]  09/13/2020, 11:22 PM

## 2020-09-13 NOTE — ED Notes (Signed)
Daughter at bedside. Patient and daughter updated on plan of care/holding in ED, verbalized understanding of same. Mouth swabs and pillow provided. Strong pedal pulses noted BILAT. Denies further needs at this time.

## 2020-09-13 NOTE — ED Notes (Signed)
Pt provided [place] sandwich, chips & water per request, advised that NPO status begins at 0000.

## 2020-09-13 NOTE — Progress Notes (Signed)
Radiographs reviewed Left femoral neck fracture displaced following mechanical fall Plan hip replacement tomorrow Full consult to follow Okay for SCDs but do not give chemical prophylaxis

## 2020-09-14 ENCOUNTER — Inpatient Hospital Stay (HOSPITAL_COMMUNITY): Payer: Medicare Other | Admitting: Certified Registered Nurse Anesthetist

## 2020-09-14 ENCOUNTER — Inpatient Hospital Stay (HOSPITAL_COMMUNITY): Payer: Medicare Other

## 2020-09-14 ENCOUNTER — Encounter (HOSPITAL_COMMUNITY)
Admission: EM | Disposition: A | Discharge status: Home Health | Payer: Self-pay | Source: Home / Self Care | Attending: Internal Medicine

## 2020-09-14 ENCOUNTER — Other Ambulatory Visit: Payer: Self-pay

## 2020-09-14 ENCOUNTER — Encounter (HOSPITAL_COMMUNITY): Payer: Self-pay | Admitting: Internal Medicine

## 2020-09-14 DIAGNOSIS — M12552 Traumatic arthropathy, left hip: Secondary | ICD-10-CM

## 2020-09-14 DIAGNOSIS — I10 Essential (primary) hypertension: Secondary | ICD-10-CM

## 2020-09-14 DIAGNOSIS — S72002A Fracture of unspecified part of neck of left femur, initial encounter for closed fracture: Secondary | ICD-10-CM

## 2020-09-14 DIAGNOSIS — E039 Hypothyroidism, unspecified: Secondary | ICD-10-CM

## 2020-09-14 HISTORY — PX: TOTAL HIP ARTHROPLASTY: SHX124

## 2020-09-14 LAB — MRSA PCR SCREENING: MRSA by PCR: NEGATIVE

## 2020-09-14 LAB — ABO/RH: ABO/RH(D): A POS

## 2020-09-14 SURGERY — ARTHROPLASTY, HIP, TOTAL, ANTERIOR APPROACH
Anesthesia: Spinal | Site: Hip | Laterality: Left

## 2020-09-14 MED ORDER — VANCOMYCIN HCL 1000 MG IV SOLR
INTRAVENOUS | Status: DC | PRN
  Administered 2020-09-14: 1000 mg via TOPICAL

## 2020-09-14 MED ORDER — CHLORHEXIDINE GLUCONATE 0.12 % MT SOLN: 15.0000 mL | Freq: Once | OROMUCOSAL | Status: AC

## 2020-09-14 MED ORDER — MENTHOL 3 MG MT LOZG: 1.0000 | LOZENGE | OROMUCOSAL | Status: DC | PRN

## 2020-09-14 MED ORDER — SODIUM CHLORIDE 0.9 % IR SOLN
Status: DC | PRN
  Administered 2020-09-14: 3000 mL

## 2020-09-14 MED ORDER — LACTATED RINGERS IV SOLN: INTRAVENOUS | Status: AC

## 2020-09-14 MED ORDER — TRANEXAMIC ACID-NACL 1000-0.7 MG/100ML-% IV SOLN
INTRAVENOUS | Status: AC
  Filled 2020-09-14: qty 100

## 2020-09-14 MED ORDER — HYDROMORPHONE HCL 1 MG/ML IJ SOLN
INTRAMUSCULAR | Status: AC
Start: 2020-09-14 — End: 2020-09-15
  Filled 2020-09-14: qty 1

## 2020-09-14 MED ORDER — ONDANSETRON HCL 4 MG PO TABS: 4.0000 mg | ORAL_TABLET | Freq: Four times a day (QID) | ORAL | Status: DC | PRN

## 2020-09-14 MED ORDER — ONDANSETRON HCL 4 MG/2ML IJ SOLN: 4.0000 mg | Freq: Once | INTRAMUSCULAR | Status: DC | PRN

## 2020-09-14 MED ORDER — METOCLOPRAMIDE HCL 5 MG/ML IJ SOLN
5.0000 mg | Freq: Three times a day (TID) | INTRAMUSCULAR | Status: DC | PRN
  Administered 2020-09-14: 10 mg via INTRAVENOUS
  Filled 2020-09-14: qty 2

## 2020-09-14 MED ORDER — LIDOCAINE HCL (CARDIAC) PF 100 MG/5ML IV SOSY
PREFILLED_SYRINGE | INTRAVENOUS | Status: DC | PRN
  Administered 2020-09-14: 100 mg via INTRAVENOUS

## 2020-09-14 MED ORDER — OXYCODONE HCL 5 MG PO TABS
5.0000 mg | ORAL_TABLET | ORAL | Status: DC | PRN
  Administered 2020-09-14 – 2020-09-16 (×3): 5 mg via ORAL
  Filled 2020-09-14 (×2): qty 1
  Filled 2020-09-14: qty 2
  Filled 2020-09-14: qty 1

## 2020-09-14 MED ORDER — PHENYLEPHRINE HCL (PRESSORS) 10 MG/ML IV SOLN
INTRAVENOUS | Status: DC | PRN
  Administered 2020-09-14 (×3): 80 ug via INTRAVENOUS

## 2020-09-14 MED ORDER — LACTATED RINGERS IV SOLN: INTRAVENOUS | Status: DC

## 2020-09-14 MED ORDER — DEXTROSE-NACL 5-0.9 % IV SOLN: INTRAVENOUS | Status: AC

## 2020-09-14 MED ORDER — ONDANSETRON HCL 4 MG/2ML IJ SOLN
4.0000 mg | Freq: Four times a day (QID) | INTRAMUSCULAR | Status: DC | PRN
  Administered 2020-09-14: 4 mg via INTRAVENOUS
  Filled 2020-09-14: qty 2

## 2020-09-14 MED ORDER — PROPOFOL 10 MG/ML IV BOLUS
INTRAVENOUS | Status: AC
  Filled 2020-09-14: qty 20

## 2020-09-14 MED ORDER — ALUM & MAG HYDROXIDE-SIMETH 200-200-20 MG/5ML PO SUSP: 30.0000 mL | ORAL | Status: DC | PRN

## 2020-09-14 MED ORDER — ALBUMIN HUMAN 5 % IV SOLN: INTRAVENOUS | Status: DC | PRN

## 2020-09-14 MED ORDER — ASPIRIN 81 MG PO CHEW
81.0000 mg | CHEWABLE_TABLET | Freq: Two times a day (BID) | ORAL | Status: DC
  Administered 2020-09-14 – 2020-09-16 (×4): 81 mg via ORAL
  Filled 2020-09-14 (×3): qty 1

## 2020-09-14 MED ORDER — CEFAZOLIN SODIUM-DEXTROSE 2-4 GM/100ML-% IV SOLN
2.0000 g | Freq: Three times a day (TID) | INTRAVENOUS | Status: AC
  Administered 2020-09-14 – 2020-09-15 (×2): 2 g via INTRAVENOUS
  Filled 2020-09-14 (×2): qty 100

## 2020-09-14 MED ORDER — PHENYLEPHRINE HCL-NACL 10-0.9 MG/250ML-% IV SOLN
INTRAVENOUS | Status: DC | PRN
  Administered 2020-09-14: 40 ug/min via INTRAVENOUS

## 2020-09-14 MED ORDER — CHLORHEXIDINE GLUCONATE 0.12 % MT SOLN
OROMUCOSAL | Status: AC
  Administered 2020-09-14: 15 mL via OROMUCOSAL
  Filled 2020-09-14: qty 15

## 2020-09-14 MED ORDER — DOCUSATE SODIUM 100 MG PO CAPS
100.0000 mg | ORAL_CAPSULE | Freq: Two times a day (BID) | ORAL | Status: DC
  Administered 2020-09-14 – 2020-09-16 (×4): 100 mg via ORAL
  Filled 2020-09-14 (×4): qty 1

## 2020-09-14 MED ORDER — HYDROMORPHONE HCL 1 MG/ML IJ SOLN: 0.5000 mg | INTRAMUSCULAR | Status: DC | PRN

## 2020-09-14 MED ORDER — PROPOFOL 10 MG/ML IV BOLUS
INTRAVENOUS | Status: DC | PRN
  Administered 2020-09-14: 20 mg via INTRAVENOUS
  Administered 2020-09-14: 30 mg via INTRAVENOUS

## 2020-09-14 MED ORDER — ONDANSETRON HCL 4 MG/2ML IJ SOLN
INTRAMUSCULAR | Status: DC | PRN
  Administered 2020-09-14: 4 mg via INTRAVENOUS

## 2020-09-14 MED ORDER — HYDROMORPHONE HCL 1 MG/ML IJ SOLN
0.2500 mg | INTRAMUSCULAR | Status: DC | PRN
  Administered 2020-09-14: 0.5 mg via INTRAVENOUS

## 2020-09-14 MED ORDER — FENTANYL CITRATE (PF) 250 MCG/5ML IJ SOLN
INTRAMUSCULAR | Status: AC
  Filled 2020-09-14: qty 5

## 2020-09-14 MED ORDER — ACETAMINOPHEN 500 MG PO TABS
1000.0000 mg | ORAL_TABLET | Freq: Four times a day (QID) | ORAL | Status: AC
  Administered 2020-09-14 – 2020-09-15 (×3): 1000 mg via ORAL
  Filled 2020-09-14 (×4): qty 2

## 2020-09-14 MED ORDER — PHENOL 1.4 % MT LIQD: 1.0000 | OROMUCOSAL | Status: DC | PRN

## 2020-09-14 MED ORDER — INFLUENZA VAC A&B SA ADJ QUAD 0.5 ML IM PRSY
0.5000 mL | PREFILLED_SYRINGE | INTRAMUSCULAR | Status: AC
  Administered 2020-09-16: 0.5 mL via INTRAMUSCULAR
  Filled 2020-09-14: qty 0.5

## 2020-09-14 MED ORDER — METOCLOPRAMIDE HCL 5 MG PO TABS: 5.0000 mg | ORAL_TABLET | Freq: Three times a day (TID) | ORAL | Status: DC | PRN

## 2020-09-14 MED ORDER — PROPOFOL 500 MG/50ML IV EMUL
INTRAVENOUS | Status: DC | PRN
  Administered 2020-09-14: 40 ug/kg/min via INTRAVENOUS

## 2020-09-14 MED ORDER — VANCOMYCIN HCL 1000 MG IV SOLR
INTRAVENOUS | Status: AC
  Filled 2020-09-14: qty 1000

## 2020-09-14 SURGICAL SUPPLY — 57 items
BAG DECANTER FOR FLEXI CONT (MISCELLANEOUS) ×3 IMPLANT
BLADE CLIPPER SURG (BLADE) ×3 IMPLANT
BLADE SAW SGTL 18X1.27X75 (BLADE) ×2 IMPLANT
BLADE SAW SGTL 18X1.27X75MM (BLADE) ×1
CELLS DAT CNTRL 66122 CELL SVR (MISCELLANEOUS) ×1 IMPLANT
CLOSURE WOUND 1/2 X4 (GAUZE/BANDAGES/DRESSINGS) ×1
COVER PERINEAL POST (MISCELLANEOUS) ×3 IMPLANT
COVER SURGICAL LIGHT HANDLE (MISCELLANEOUS) ×3 IMPLANT
COVER WAND RF STERILE (DRAPES) ×3 IMPLANT
CUP SECTOR GRIPTON 50MM (Cup) ×2 IMPLANT
DRAPE C-ARM 42X72 X-RAY (DRAPES) ×3 IMPLANT
DRAPE STERI IOBAN 125X83 (DRAPES) ×3 IMPLANT
DRAPE U-SHAPE 47X51 STRL (DRAPES) ×9 IMPLANT
DRSG AQUACEL AG ADV 3.5X10 (GAUZE/BANDAGES/DRESSINGS) ×3 IMPLANT
DURAPREP 26ML APPLICATOR (WOUND CARE) ×3 IMPLANT
ELECT BLADE 4.0 EZ CLEAN MEGAD (MISCELLANEOUS) ×3
ELECT REM PT RETURN 9FT ADLT (ELECTROSURGICAL) ×3
ELECTRODE BLDE 4.0 EZ CLN MEGD (MISCELLANEOUS) ×1 IMPLANT
ELECTRODE REM PT RTRN 9FT ADLT (ELECTROSURGICAL) ×1 IMPLANT
GLOVE BIOGEL PI IND STRL 8 (GLOVE) ×1 IMPLANT
GLOVE BIOGEL PI INDICATOR 8 (GLOVE) ×2
GLOVE ECLIPSE 8.0 STRL XLNG CF (GLOVE) ×6 IMPLANT
GOWN STRL REUS W/ TWL LRG LVL3 (GOWN DISPOSABLE) ×3 IMPLANT
GOWN STRL REUS W/TWL LRG LVL3 (GOWN DISPOSABLE) ×9
HANDPIECE INTERPULSE COAX TIP (DISPOSABLE) ×3
HEAD FEM STD 32X+9 STRL (Hips) ×2 IMPLANT
HOOD PEEL AWAY FLYTE STAYCOOL (MISCELLANEOUS) ×9 IMPLANT
JET LAVAGE IRRISEPT WOUND (IRRIGATION / IRRIGATOR) ×3
KIT BASIN OR (CUSTOM PROCEDURE TRAY) ×3 IMPLANT
KIT TURNOVER KIT B (KITS) ×3 IMPLANT
LAVAGE JET IRRISEPT WOUND (IRRIGATION / IRRIGATOR) IMPLANT
LINER ACET PNNCL PLUS4 NEUTRAL (Hips) IMPLANT
NEEDLE HYPO 22GX1.5 SAFETY (NEEDLE) ×3 IMPLANT
NS IRRIG 1000ML POUR BTL (IV SOLUTION) ×3 IMPLANT
PACK TOTAL JOINT (CUSTOM PROCEDURE TRAY) ×3 IMPLANT
PAD ARMBOARD 7.5X6 YLW CONV (MISCELLANEOUS) ×3 IMPLANT
PINNACLE PLUS 4 NEUTRAL (Hips) ×3 IMPLANT
RETRACTOR WND ALEXIS 18 MED (MISCELLANEOUS) ×1 IMPLANT
RTRCTR WOUND ALEXIS 18CM MED (MISCELLANEOUS) ×3
SCREW 6.5MMX25MM (Screw) ×2 IMPLANT
SET HNDPC FAN SPRY TIP SCT (DISPOSABLE) ×1 IMPLANT
STEM FEMORAL SZ 5MM STD ACTIS (Stem) ×2 IMPLANT
STRIP CLOSURE SKIN 1/2X4 (GAUZE/BANDAGES/DRESSINGS) ×2 IMPLANT
SUT ETHIBOND NAB CT1 #1 30IN (SUTURE) ×8 IMPLANT
SUT MNCRL AB 3-0 PS2 18 (SUTURE) ×3 IMPLANT
SUT VIC AB 0 CT1 27 (SUTURE) ×9
SUT VIC AB 0 CT1 27XBRD ANBCTR (SUTURE) ×3 IMPLANT
SUT VIC AB 1 CT1 27 (SUTURE) ×9
SUT VIC AB 1 CT1 27XBRD ANBCTR (SUTURE) ×3 IMPLANT
SUT VIC AB 2-0 CT1 27 (SUTURE) ×6
SUT VIC AB 2-0 CT1 TAPERPNT 27 (SUTURE) ×2 IMPLANT
SYR 30ML LL (SYRINGE) ×3 IMPLANT
TOWEL GREEN STERILE (TOWEL DISPOSABLE) ×3 IMPLANT
TOWEL GREEN STERILE FF (TOWEL DISPOSABLE) ×3 IMPLANT
TRAY FOLEY MTR SLVR 16FR STAT (SET/KITS/TRAYS/PACK) ×3 IMPLANT
WATER STERILE IRR 1000ML POUR (IV SOLUTION) ×3 IMPLANT
YANKAUER SUCT BULB TIP NO VENT (SUCTIONS) ×2 IMPLANT

## 2020-09-14 NOTE — Anesthesia Preprocedure Evaluation (Addendum)
Anesthesia Evaluation  Patient identified by MRN, date of birth, ID band Patient awake    Reviewed: Allergy & Precautions, H&P , NPO status , Patient's Chart, lab work & pertinent test results  Airway Mallampati: II  TM Distance: >3 FB Neck ROM: full    Dental  (+) Dental Advisory Given   Pulmonary shortness of breath,    breath sounds clear to auscultation       Cardiovascular hypertension, Pt. on medications + Valvular Problems/Murmurs  Rhythm:regular Rate:Normal + Systolic murmurs    Neuro/Psych PSYCHIATRIC DISORDERS Anxiety    GI/Hepatic GERD  ,  Endo/Other  Hypothyroidism obese  Renal/GU      Musculoskeletal  (+) Arthritis ,   Abdominal   Peds  Hematology   Anesthesia Other Findings   Reproductive/Obstetrics                            Anesthesia Physical  Anesthesia Plan  ASA: III  Anesthesia Plan: Spinal   Post-op Pain Management:    Induction: Intravenous  PONV Risk Score and Plan: 2 and Treatment may vary due to age or medical condition, Ondansetron, Propofol infusion and TIVA  Airway Management Planned:   Additional Equipment: None  Intra-op Plan:   Post-operative Plan:   Informed Consent: I have reviewed the patients History and Physical, chart, labs and discussed the procedure including the risks, benefits and alternatives for the proposed anesthesia with the patient or authorized representative who has indicated his/her understanding and acceptance.     Dental advisory given  Plan Discussed with: CRNA  Anesthesia Plan Comments:        Anesthesia Quick Evaluation

## 2020-09-14 NOTE — Op Note (Signed)
NAMELANIESHA, DAS MEDICAL RECORD [id number] ACCOUNT [id number] DATE OF BIRTH:September 25, 1941 FACILITY: [facility] LOCATION: [facility] PHYSICIAN:Slate Debroux Randel Pigg, MD  OPERATIVE REPORT  DATE OF PROCEDURE:  09/14/2020  PREOPERATIVE DIAGNOSIS:  Left femoral neck fracture.  POSTOPERATIVE DIAGNOSIS:  Left femoral neck fracture.  PROCEDURE:  Left total hip replacement using DePuy Actis size 5 stem with a 50 mm Pinnacle cup, +4 liner and +9 mm cobalt chromium head.  SURGEON:  Meredith Pel, MD  ASSISTANT:  Annie Main, PA  INDICATIONS:  The patient is an 79 year old active female with left hip pain after a fall.  Presents now for hip replacement after explanation of risks and benefits.  PROCEDURE IN DETAIL:  The patient was brought to the operating room where spinal anesthetic was induced.  Preoperative antibiotics administered.  Timeout was called.  Left hip was prescrubbed with alcohol and Betadine, allowed to air dry, prepped with  DuraPrep solution and draped in a sterile manner.  Hana bed was utilized.  An incision was made 2 cm distal and inferior to the anterior superior iliac crest.  Skin and subcutaneous tissue were sharply divided.  Fascia lata was encountered and divided  from the anterior superior iliac crest distally.  Care was taken to avoid injury to the lateral femoral cutaneous nerves.  The fascia was divided and the plane developed between the tensor fascia lata and the rectus.  Circumflex vessels were coagulated.   Cobra retractors placed on the anterior and posterior aspect of the femoral neck.  Arthrotomy was performed, tagged with a #1 Vicryl suture.  Femoral neck cut was then made and the femoral head was excised.  Under fluoroscopic guidance, the correct  angle and amount of femoral neck resection was confirmed.  Next, a bent 90 retractor was placed around the anterior rim of the acetabulum and pulvinar was excised.  The labrum was also excised.  Next, reaming was performed.   Reaming was done under  fluoroscopic guidance in about 40 degrees of abduction and 10 degrees of anteversion.  Once this was performed,  excellent bone quality was present.  We reamed up to a size 49 and a 50 Pinnacle cup was utilized with excellent press fit obtained.  A +4  liner placed.  Attention was then directed towards the femur,  external rotation to 120 was performed.  Capsule was released.  Conjoined tendon was partially released.  External rotation, adduction and extension was performed.  The femur was broached up  to a size 5 parallel to the long axis of the calcar.  With the trial components in position, the patient had the best stability and leg lengths with the +9 ball.  This gave very good stability to internal rotation and adduction, along with external  rotation to 45 and hip extension to 60.  Trial components were removed.  True components placed with excellent press fit obtained on the Actis stem.  The +9 cobalt chrome ball was placed and the same stability parameters were maintained.  Thorough  irrigation was performed.  Vancomycin powder was then placed after using IrriSept.  IrriSept solution was used after the incision as well as after the arthrotomy and at multiple times during the case to prevent infection.  Capsule was closed using #1  Vicryl suture, followed by interrupted inverted #1 Vicryl suture to close the fascia lata.  Vancomycin placed at multiple levels within the incision.  The skin was then closed using interrupted inverted 0 Vicryl suture, 2-0 Vicryl suture and a 3-0  Monocryl.  Aquacel dressing applied.  Luke's assistance was required at all times for the case for opening and closing limb positioning and retraction.  His assistance was a medical necessity.  VN/NUANCE  D:09/14/2020 T:09/14/2020 JOB:[id number]

## 2020-09-14 NOTE — ED Notes (Signed)
Daughter, Vicente Males, requests all updates be called to her, number is in the chart.

## 2020-09-14 NOTE — Consult Note (Signed)
Reason for Consult: Left hip pain Referring Physician: Dr. [place]  Rhonda [name] is an 79 y.o. female.  HPI: Rhonda [name] is an 79 year old female with left hip pain following mechanical fall yesterday.  Denies any loss of consciousness or other orthopedic complaints.  Does report inability to weight-bear as well as left hip pain.  Patient does not use any ambulatory aid assistance and lives at home with her husband.  They live on the first floor.  Past Medical History:  Diagnosis Date  . Anxiety    pt. may have panicy feeling upon awaking from anesth. - states she feels anxious at times  & has used Paxil & xanax in the [place] but its been a very long time ago   . Arthritis    rheumatoid & OA-hands, shoulders   . Dyspnea   . GERD (gastroesophageal reflux disease)   . Heart murmur   . Hypertension     Past Surgical History:  Procedure Laterality Date  . APPENDECTOMY     laproscopic  . COLONOSCOPY    . HEMORROIDECTOMY    . INSERTION OF MESH N/A 09/12/2018   Procedure: INSERTION OF MESH;  Surgeon: Jovita Kussmaul, MD;  Location: [facility];  Service: General;  Laterality: N/A;  . LAPAROSCOPIC APPENDECTOMY N/A 01/06/2017   Procedure: LAPAROSCOPIC APPENDECTOMY;  Surgeon: Autumn Messing III, MD;  Location: [facility];  Service: General;  Laterality: N/A;  . TONSILLECTOMY     as a child  . VAGINAL DELIVERY     x2  . VENTRAL HERNIA REPAIR N/A 09/12/2018   Procedure: LAPAROSCOPIC VENTRAL HERNIA REPAIR WITH MESH;  Surgeon: Jovita Kussmaul, MD;  Location: [facility];  Service: General;  Laterality: N/A;  . VIDEO BRONCHOSCOPY Bilateral 11/07/2019   Procedure: VIDEO BRONCHOSCOPY WITH FLUORO;  Surgeon: Chesley Mires, MD;  Location: [facility];  Service: Endoscopy;  Laterality: Bilateral;    Family History  Problem Relation Age of Onset  . Breast cancer Daughter   . Breast cancer Maternal Grandfather     Social History:  reports that she has never smoked. She has never used smokeless tobacco. She reports current  alcohol use. She reports that she does not use drugs.  Allergies:  Allergies  Allergen Reactions  . Aleve [Naproxen] Other (See Comments)    GI upset    Medications: I have reviewed the patient's current medications.  Results for orders placed or performed during the hospital encounter of 09/13/20 (from the past 48 hour(s))  Basic metabolic panel     Status: Abnormal   Collection Time: 09/13/20  8:54 PM  Result Value Ref Range   Sodium 133 (L) 135 - 145 mmol/L   Potassium 4.3 3.5 - 5.1 mmol/L   Chloride 99 98 - 111 mmol/L   CO2 22 22 - 32 mmol/L   Glucose, Bld 142 (H) 70 - 99 mg/dL    Comment: Glucose reference range applies only to samples taken after fasting for at least 8 hours.   BUN 16 8 - 23 mg/dL   Creatinine, Ser 1.06 (H) 0.44 - 1.00 mg/dL   Calcium 9.3 8.9 - 10.3 mg/dL   GFR calc non Af Amer 50 (L) >60 mL/min   GFR calc Af Amer 57 (L) >60 mL/min   Anion gap 12 5 - 15    Comment: Performed at [facility] [address], [place], [place] [address]  CBC WITH DIFFERENTIAL     Status: Abnormal   Collection Time: 09/13/20  8:54 PM  Result Value Ref Range   WBC 13.7 (H) 4.0 - 10.5 K/uL   RBC 3.41 (L) 3.87 - 5.11 MIL/uL   Hemoglobin 11.0 (L) 12.0 - 15.0 g/dL   HCT 33.4 (L) 36 - 46 %   MCV 97.9 80.0 - 100.0 fL   MCH 32.3 26.0 - 34.0 pg   MCHC 32.9 30.0 - 36.0 g/dL   RDW 13.8 11.5 - 15.5 %   Platelets 400 150 - 400 K/uL   nRBC 0.0 0.0 - 0.2 %   Neutrophils Relative % 84 %   Neutro Abs 11.7 (H) 1.7 - 7.7 K/uL   Lymphocytes Relative 6 %   Lymphs Abs 0.8 0.7 - 4.0 K/uL   Monocytes Relative 8 %   Monocytes Absolute 1.2 (H) 0 - 1 K/uL   Eosinophils Relative 0 %   Eosinophils Absolute 0.0 0 - 0 K/uL   Basophils Relative 1 %   Basophils Absolute 0.1 0 - 0 K/uL   Immature Granulocytes 1 %   Abs Immature Granulocytes 0.08 (H) 0.00 - 0.07 K/uL    Comment: Performed at [facility], [address] [place], [place] [address]  Protime-INR     Status: None    Collection Time: 09/13/20  8:54 PM  Result Value Ref Range   Prothrombin Time 13.4 11.4 - 15.2 seconds   INR 1.1 0.8 - 1.2    Comment: (NOTE) INR goal varies based on device and disease states. Performed at [facility], [place] [address], [place], [place] [address]   Type and screen [place]     Status: None   Collection Time: 09/13/20  9:27 PM  Result Value Ref Range   ABO/RH(D) A POS    Antibody Screen NEG    Sample Expiration      09/16/2020,2359 Performed at [facility], [place] [address], [place], [place] [address]   SARS Coronavirus 2 by RT PCR (hospital order, performed in [facility] hospital lab) Nasopharyngeal Nasopharyngeal Swab     Status: None   Collection Time: 09/13/20 10:33 PM   Specimen: Nasopharyngeal Swab  Result Value Ref Range   SARS Coronavirus 2 NEGATIVE NEGATIVE    Comment: (NOTE) SARS-CoV-2 target nucleic acids are NOT DETECTED.  The SARS-CoV-2 RNA is generally detectable in upper and lower respiratory specimens during the acute phase of infection. The lowest concentration of SARS-CoV-2 viral copies this assay can detect is 250 copies / mL. A negative result does not preclude SARS-CoV-2 infection and should not be used as the sole basis for treatment or other patient management decisions.  A negative result may occur with improper specimen collection / handling, submission of specimen other than nasopharyngeal swab, presence of viral mutation(s) within the areas targeted by this assay, and inadequate number of viral copies (<250 copies / mL). A negative result must be combined with clinical observations, patient history, and epidemiological information.  Fact Sheet for Patients:   StrictlyIdeas.no  Fact Sheet for Healthcare Providers: BankingDealers.co.za  This test is not yet approved or  cleared by the [place] FDA and has been authorized for detection and/or  diagnosis of SARS-CoV-2 by FDA under an Emergency Use Authorization (EUA).  This EUA will remain in effect (meaning this test can be used) for the duration of the COVID-19 declaration under Section 564(b)(1) of the Act, 21 U.S.C. section 360bbb-3(b)(1), unless the authorization is terminated or revoked sooner.  Performed at [facility], [place] [address],  [place], [place] [address]   MRSA PCR Screening     Status: None   Collection Time: 09/14/20  5:04 AM   Specimen: Nasal Mucosa; Nasopharyngeal  Result Value Ref Range   MRSA by PCR NEGATIVE NEGATIVE    Comment:        The GeneXpert MRSA Assay (FDA approved for NASAL specimens only), is one component of a comprehensive MRSA colonization surveillance program. It is not intended to diagnose MRSA infection nor to guide or monitor treatment for MRSA infections. Performed at [facility], [place] [address] [place],  [address]     DG Chest Port 1 View  Result Date: 09/13/2020 CLINICAL DATA:  79 year old female with hip fracture. EXAM: PORTABLE CHEST 1 VIEW COMPARISON:  Chest CT 04/11/2020 and earlier. FINDINGS: Portable AP semi upright view at 2105 hours. Stable lung volumes. Stable moderate elevation of the right hemidiaphragm. Mediastinal contours remain normal. Visualized tracheal air column is within normal limits. Allowing for portable technique the lungs are clear. No acute osseous abnormality identified. IMPRESSION: No acute cardiopulmonary abnormality. Electronically Signed   By: Genevie Alaijah M.D.   On: 09/13/2020 21:17   DG Hip Unilat W or Wo Pelvis 2-3 Views Left  Result Date: 09/13/2020 CLINICAL DATA:  79 year old female status post fall with left hip and thigh pain. EXAM: DG HIP (WITH OR WITHOUT PELVIS) 2-3V LEFT COMPARISON:  CT Abdomen and Pelvis 12/15/2018. FINDINGS: Both femoral heads remain normally located but there is an acute left femoral neck fracture. The fracture is mildly impacted, mild varus angulation.  The intertrochanteric segment appears to remain intact. No superimposed fracture of the pelvis is identified. Grossly intact proximal right femur. Negative visible lower abdominal and pelvic visceral contours. IMPRESSION: Acute left femoral neck fracture with mild varus impaction. Electronically Signed   By: Genevie Smera M.D.   On: 09/13/2020 20:53    Review of Systems  Musculoskeletal: Positive for arthralgias.  All other systems reviewed and are negative.  Blood pressure (!) 107/54, pulse 100, temperature 98.4 F (36.9 C), temperature source Oral, resp. rate 18, SpO2 96 %. Physical Exam Vitals reviewed.  HENT:     Head: Normocephalic.     Nose: Nose normal.     Mouth/Throat:     Mouth: Mucous membranes are moist.  Eyes:     Pupils: Pupils are equal, round, and reactive to light.  Cardiovascular:     Rate and Rhythm: Normal rate.     Pulses: Normal pulses.  Pulmonary:     Effort: Pulmonary effort is normal.  Abdominal:     General: Abdomen is flat.  Skin:    General: Skin is warm.     Capillary Refill: Capillary refill takes less than 2 seconds.  Neurological:     General: No focal deficit present.     Mental Status: She is alert.  Psychiatric:        Mood and Affect: Mood normal.   Examination of bilateral upper extremities demonstrates good grip EPL FPL interosseous wrist flexion wrist extension bicep triceps and deltoid strength.  Neck range of motion intact.  Right lower extremity has good ankle dorsiflexion with palpable pedal pulses and no effusion or swelling in the knee or ankle.  No groin pain on the right with internal extra rotation of the leg.  On the left-hand side the patient does have some shortening and external rotation.  Pedal pulses intact.  Ankle dorsiflexion plantarflexion intact.  No knee effusion or swelling and no left ankle  effusion or swelling  Assessment/Plan: Impression is left displaced femoral neck fracture in an ambulatory patient who is otherwise  healthy.  She has husband at home and minimal stairs to get into and out of her house.  Patient is active.  Plan is left total hip replacement.  Risk benefits are discussed including not limited to infection nerve vessel damage leg length inequality dislocation as well as potential need for more surgery.  No personal or family history of DVT or pulmonary embolism.  All questions answered  Anderson Malta 09/14/2020, 8:38 AM

## 2020-09-14 NOTE — Discharge Instructions (Signed)
Information about methotrexate tablets  This medication education was reviewed with me or my healthcare representative as part of my discharge preparation. The pharmacist that spoke with me during my hospital stay was: Nikitha Mode Z  Terisa Belardo,, RPH  WHY WAS METHOTREXATE PRESCRIBED FOR YOU? When taken once a week, methotrexate is commonly used to treat rheumatoid arthritis and skin conditions like psoriasis.  It may also be prescribed to treat some blood cancers like leukemia.  WHAT DO YOU NEED TO KNOW ABOUT METHOTREXATE? Take your methotrexate ONCE WEEKLY on the same day each week. It would be best to choose one day of the week that you will take your dose and take the medication only on that day every week.   Take methotrexate exactly as prescribed by your healthcare provider, and DO NOT take methotrexate every day. DO NOT take extra doses to relieve your symptoms. When first starting methotrexate, improvement in symptoms is gradual, and symptom relief usually begins in 3 - 6 weeks after starting the medication.  Tell your healthcare provider about all of your medical conditions before starting methotrexate including if you drink alcohol often, you are pregnant or plan to become pregnant, you have kidney or liver problems, or you have a blood or bone marrow disorder.  Avoid drinking alcohol while you are prescribed methotrexate.  Methotrexate can make your skin more sensitive to the sun.  If you cannot avoid being in the sun, wear protective clothing and use sunscreen.   After discharge, you should have regular check-up appointments with the healthcare provider that is prescribing your methotrexate.   WHAT DO YOU DO IF YOU MISS A DOSE? If you are taking methotrexate ONCE WEEKLY and you miss a dose, call and speak to your healthcare provider for instructions. DO NOT take methotrexate every day.  IMPORTANT SAFETY INFORMATION Methotrexate can increase your risk of getting infections and your risk of  bleeding. Call your healthcare provider immediately if you have severe diarrhea or black stools; sores in your mouth, nose, or throat; a rash or red, peeling, blistered skin; fever or chills; trouble breathing; a racing heartbeat; unusual bruising or bleeding; dizziness or weakness.  Some medicines may interact with methotrexate and might increase your risk side effects. To help avoid this, consult your healthcare provider prior to using any new prescription or over-the-counter medications, including herbals and dietary supplements, vitamins, non-steroidal anti-inflammatory drugs (NSAIDs) like ibuprofen or naproxen, acid reflux or heartburn medications, as well as antibiotics.  This is a summary and does not cover all possible information on methotrexate. If you have questions about this medicine, talk to your healthcare provider.

## 2020-09-14 NOTE — Anesthesia Procedure Notes (Addendum)
Spinal  Patient location during procedure: OR Start time: 09/14/2020 12:52 PM End time: 09/14/2020 12:57 PM Staffing Performed: anesthesiologist  Anesthesiologist: Nolon Nations, MD Preanesthetic Checklist Completed: patient identified, IV checked, site marked, risks and benefits discussed, surgical consent, monitors and equipment checked, pre-op evaluation and timeout performed Spinal Block Patient position: sitting Prep: DuraPrep and site prepped and draped Patient monitoring: heart rate, cardiac monitor, continuous pulse ox and blood pressure Approach: midline Location: L2-3 Injection technique: single-shot Needle Needle type: Sprotte  Needle gauge: 24 G Needle length: 9 cm Assessment Sensory level: T4 Additional Notes Expiration date of kit checked and confirmed. Patient tolerated procedure well, without complications.

## 2020-09-14 NOTE — Plan of Care (Signed)
  Problem: Activity: Goal: Ability to avoid complications of mobility impairment will improve Outcome: Progressing Goal: Ability to tolerate increased activity will improve Outcome: Progressing   Problem: Education: Goal: Verbalization of understanding the information provided will improve Outcome: Progressing   Problem: Coping: Goal: Level of anxiety will decrease Outcome: Progressing   Problem: Physical Regulation: Goal: Postoperative complications will be avoided or minimized Outcome: Progressing   Problem: Pain Management: Goal: Pain level will decrease Outcome: Progressing   Problem: Skin Integrity: Goal: Signs of wound healing will improve Outcome: Progressing

## 2020-09-14 NOTE — Progress Notes (Signed)
PROGRESS NOTE    [name]  YBW:[id number] DOB: 24-Nov-1941 DOA: 09/13/2020 PCP: Leeroy Cha, MD    Brief Narrative:  Rhonda [name] is a 79 year old female with past medical history notable for rheumatoid arthritis, hypothyroidism, essential hypertension, anxiety who presented to the ED with left hip pain following a fall.  Upon presentation to the ED, pelvic x-ray notable for acute left femoral neck fracture with mild varus impaction.  Orthopedics was consulted.  TRH consulted for admission.   Assessment & Plan:   Principal Problem:   Closed left hip fracture (HCC) Active Problems:   HTN (hypertension)   Rheumatoid arthritis ([place])   Hypothyroidism   Left displaced femoral neck fracture Patient presenting to the ED following mechanical fall at home with left hip pain and inability to ambulate.  Imaging studies notable for left femoral neck fracture. --Orthopedics following, appreciate assistance --Plan for total hip arthroplasty anterior approach by Dr. Marlou Sa this afternoon --Continue n.p.o.; D5NS at 9mL/hr --Dilaudid 0.5 mg IV every 4 hours as needed moderate pain --PT/OT eval postoperatively --Postoperative DVT prophylaxis per orthopedics  Essential hypertension On amlodipine 5 mg p.o. daily, olmesartan 20 mg p.o. daily at home.  BP 117/56. --Continue to hold home antihypertensives while n.p.o. with normal BP --Continue to monitor BP closely, and will restart when appropriate  Rheumatoid arthritis --Methotrexate 12.5 mg p.o. weekly on Friday --Hydroxychloroquine 200 mg p.o. twice daily --Folic acid 1 mg p.o. daily  Hypothyroidism --Levothyroxine 25 mcg p.o. daily  Anxiety --Paxil 20 mg p.o. daily  Hyperlipidemia: Simvastatin 40 mg p.o. daily  GERD: Continue PPI   DVT prophylaxis: SCDs, postoperatively per orthopedics Code Status: Full code Family Communication: Updated patient extensively at bedside, updated patients husband, Ram via telephone  this am.  Disposition Plan:  Status is: Inpatient  Remains inpatient appropriate because:Ongoing active pain requiring inpatient pain management, Ongoing diagnostic testing needed not appropriate for outpatient work up, Unsafe d/c plan, IV treatments appropriate due to intensity of illness or inability to take PO and Inpatient level of care appropriate due to severity of illness   Dispo: The patient is from: Home              Anticipated d/c is to: To be determined              Anticipated d/c date is: 3 days              Patient currently is not medically stable to d/c.   Consultants:   Orthopedics  Procedures:   Pending total hip arthroplasty anterior approach, Dr. Marlou Sa on 09/14/2020  Antimicrobials:   Perioperative cefazolin   Subjective: Patient seen and examined bedside, resting comfortably.  Pain currently controlled.  Awaiting operative management this afternoon.  No other questions or concerns at this time.  Denies headache, no fever/chills/night sweats, no nausea/vomiting/diarrhea, no chest pain, palpitations, no shortness of breath, no abdominal pain.  No acute events overnight per nursing staff.  Objective: Vitals:   09/13/20 2330 09/14/20 0015 09/14/20 0152 09/14/20 0813  BP: (!) 117/57 110/60 (!) 117/56 (!) 107/54  Pulse: 100 97 99 100  Resp: 16 11 16 18   Temp:   98 F (36.7 C) 98.4 F (36.9 C)  TempSrc:   Oral Oral  SpO2: 94% 96% 95% 96%   No intake or output data in the 24 hours ending 09/14/20 1038 There were no vitals filed for this visit.  Examination:  General exam: Appears calm and comfortable  Respiratory system: Clear to  auscultation. Respiratory effort normal.  Oxygen well on room air Cardiovascular system: S1 & S2 heard, RRR. No JVD, murmurs, rubs, gallops or clicks. No pedal edema. Gastrointestinal system: Abdomen is nondistended, soft and nontender. No organomegaly or masses felt. Normal bowel sounds heard. Central nervous system: Alert and  oriented. No focal neurological deficits. Extremities: Left lower extremity shortened with external rotation Skin: No rashes, lesions or ulcers Psychiatry: Judgement and insight appear normal. Mood & affect appropriate.     Data Reviewed: I have personally reviewed following labs and imaging studies  CBC: Recent Labs  Lab 09/13/20 2054  WBC 13.7*  NEUTROABS 11.7*  HGB 11.0*  HCT 33.4*  MCV 97.9  PLT 914   Basic Metabolic Panel: Recent Labs  Lab 09/13/20 2054  NA 133*  K 4.3  CL 99  CO2 22  GLUCOSE 142*  BUN 16  CREATININE 1.06*  CALCIUM 9.3   GFR: CrCl cannot be calculated (Unknown ideal weight.). Liver Function Tests: No results for input(s): AST, ALT, ALKPHOS, BILITOT, PROT, ALBUMIN in the last 168 hours. No results for input(s): LIPASE, AMYLASE in the last 168 hours. No results for input(s): AMMONIA in the last 168 hours. Coagulation Profile: Recent Labs  Lab 09/13/20 2054  INR 1.1   Cardiac Enzymes: No results for input(s): CKTOTAL, CKMB, CKMBINDEX, TROPONINI in the last 168 hours. BNP (last 3 results) No results for input(s): PROBNP in the last 8760 hours. HbA1C: No results for input(s): HGBA1C in the last 72 hours. CBG: No results for input(s): GLUCAP in the last 168 hours. Lipid Profile: No results for input(s): CHOL, HDL, LDLCALC, TRIG, CHOLHDL, LDLDIRECT in the last 72 hours. Thyroid Function Tests: No results for input(s): TSH, T4TOTAL, FREET4, T3FREE, THYROIDAB in the last 72 hours. Anemia Panel: No results for input(s): VITAMINB12, FOLATE, FERRITIN, TIBC, IRON, RETICCTPCT in the last 72 hours. Sepsis Labs: No results for input(s): PROCALCITON, LATICACIDVEN in the last 168 hours.  Recent Results (from the past 240 hour(s))  SARS Coronavirus 2 by RT PCR (hospital order, performed in [facility] hospital lab) Nasopharyngeal Nasopharyngeal Swab     Status: None   Collection Time: 09/13/20 10:33 PM   Specimen: Nasopharyngeal Swab  Result Value  Ref Range Status   SARS Coronavirus 2 NEGATIVE NEGATIVE Final    Comment: (NOTE) SARS-CoV-2 target nucleic acids are NOT DETECTED.  The SARS-CoV-2 RNA is generally detectable in upper and lower respiratory specimens during the acute phase of infection. The lowest concentration of SARS-CoV-2 viral copies this assay can detect is 250 copies / mL. A negative result does not preclude SARS-CoV-2 infection and should not be used as the sole basis for treatment or other patient management decisions.  A negative result may occur with improper specimen collection / handling, submission of specimen other than nasopharyngeal swab, presence of viral mutation(s) within the areas targeted by this assay, and inadequate number of viral copies (<250 copies / mL). A negative result must be combined with clinical observations, patient history, and epidemiological information.  Fact Sheet for Patients:   StrictlyIdeas.no  Fact Sheet for Healthcare Providers: BankingDealers.co.za  This test is not yet approved or  cleared by the [place] FDA and has been authorized for detection and/or diagnosis of SARS-CoV-2 by FDA under an Emergency Use Authorization (EUA).  This EUA will remain in effect (meaning this test can be used) for the duration of the COVID-19 declaration under Section 564(b)(1) of the Act, 21 U.S.C. section 360bbb-3(b)(1), unless the authorization is terminated or  revoked sooner.  Performed at [facility], [place] [address], [place], [place] [address]   MRSA PCR Screening     Status: None   Collection Time: 09/14/20  5:04 AM   Specimen: Nasal Mucosa; Nasopharyngeal  Result Value Ref Range Status   MRSA by PCR NEGATIVE NEGATIVE Final    Comment:        The GeneXpert MRSA Assay (FDA approved for NASAL specimens only), is one component of a comprehensive MRSA colonization surveillance program. It is not intended to diagnose  MRSA infection nor to guide or monitor treatment for MRSA infections. Performed at [facility], [place] [address], [place], [place] [address]          Radiology Studies: DG Chest [place] 1 View  Result Date: 09/13/2020 CLINICAL DATA:  79 year old female with hip fracture. EXAM: PORTABLE CHEST 1 VIEW COMPARISON:  Chest CT 04/11/2020 and earlier. FINDINGS: Portable AP semi upright view at 2105 hours. Stable lung volumes. Stable moderate elevation of the right hemidiaphragm. Mediastinal contours remain normal. Visualized tracheal air column is within normal limits. Allowing for portable technique the lungs are clear. No acute osseous abnormality identified. IMPRESSION: No acute cardiopulmonary abnormality. Electronically Signed   By: Genevie Brigit M.D.   On: 09/13/2020 21:17   DG Hip Unilat W or Wo Pelvis 2-3 Views Left  Result Date: 09/13/2020 CLINICAL DATA:  79 year old female status post fall with left hip and thigh pain. EXAM: DG HIP (WITH OR WITHOUT PELVIS) 2-3V LEFT COMPARISON:  CT Abdomen and Pelvis 12/15/2018. FINDINGS: Both femoral heads remain normally located but there is an acute left femoral neck fracture. The fracture is mildly impacted, mild varus angulation. The intertrochanteric segment appears to remain intact. No superimposed fracture of the pelvis is identified. Grossly intact proximal right femur. Negative visible lower abdominal and pelvic visceral contours. IMPRESSION: Acute left femoral neck fracture with mild varus impaction. Electronically Signed   By: Genevie Janmarie M.D.   On: 09/13/2020 20:53        Scheduled Meds: . chlorhexidine  60 mL Topical Once  . folic acid  6,144 mcg Oral Daily  . hydroxychloroquine  200 mg Oral BID  . [START ON 09/15/2020] influenza vaccine adjuvanted  0.5 mL Intramuscular Tomorrow-1000  . levothyroxine  25 mcg Oral QAC breakfast  . [START ON 09/20/2020] methotrexate  12.5 mg Oral Q Fri  . pantoprazole  40 mg Oral Daily  . PARoxetine  20 mg Oral  Daily  . povidone-iodine  2 application Topical Once  . simvastatin  40 mg Oral QHS   Continuous Infusions: . sodium chloride 50 mL/hr at 09/14/20 0013  .  ceFAZolin (ANCEF) IV    . dextrose 5 % and 0.9% NaCl    . tranexamic acid       LOS: 1 day    Time spent: 38 minutes spent on chart review, discussion with nursing staff, consultants, updating family and interview/physical exam; more than 50% of that time was spent in counseling and/or coordination of care.    Trinten Boudoin J [place] DO [facility] Available via Epic secure chat 7am-7pm After these hours, please refer to coverage provider listed on [email or web address] 09/14/2020, 10:38 AM

## 2020-09-14 NOTE — Transfer of Care (Signed)
Immediate Anesthesia Transfer of Care Note  Patient: Elisha Headland  Procedure(s) Performed: HIP ARTHROPLASTY ANTERIOR APPROACH (Left Hip)  Patient Location: PACU  Anesthesia Type:Spinal  Level of Consciousness: awake and alert   Airway & Oxygen Therapy: Patient Spontanous Breathing and Patient connected to nasal cannula oxygen  Post-op Assessment: Report given to RN and Post -op Vital signs reviewed and stable  Post vital signs: Reviewed and stable  Last Vitals:  Vitals Value Taken Time  BP 103/45 09/14/20 1626  Temp    Pulse 66 09/14/20 1631  Resp 18 09/14/20 1631  SpO2 91 % 09/14/20 1631  Vitals shown include unvalidated device data.  Last Pain:  Vitals:   09/14/20 1024  TempSrc:   PainSc: 0-No pain         Complications: No complications documented.

## 2020-09-14 NOTE — Brief Op Note (Signed)
   09/13/2020 - 09/14/2020  3:38 PM  PATIENT:  Dorethea Clan Dickmann  79 y.o. female  PRE-OPERATIVE DIAGNOSIS:  LEFT FEMORAL NECK FRACTURE  POST-OPERATIVE DIAGNOSIS:  LEFT FEMORAL NECK FRACTURE  PROCEDURE:  Procedure(s): HIP ARTHROPLASTY ANTERIOR APPROACH  SURGEON:  Surgeon(s): Meredith Pel, MD  ASSISTANT: magnant pa  ANESTHESIA:   spinal  EBL: 250 ml    Total I/O In: 1250 [I.V.:1000; IV Piggyback:250] Out: 300 [Blood:300]  BLOOD ADMINISTERED: none  DRAINS: none   LOCAL MEDICATIONS USED: Vancomycin  SPECIMEN:  No Specimen  COUNTS:  YES  TOURNIQUET:  * No tourniquets in log *  DICTATION: .Other Dictation: Dictation Number [id number]  PLAN OF CARE: Admit to inpatient   PATIENT DISPOSITION:  PACU - hemodynamically stable

## 2020-09-15 LAB — BASIC METABOLIC PANEL
Anion gap: 9 (ref 5–15)
BUN: 16 mg/dL (ref 8–23)
CO2: 23 mmol/L (ref 22–32)
Calcium: 8.4 mg/dL — ABNORMAL LOW (ref 8.9–10.3)
Chloride: 101 mmol/L (ref 98–111)
Creatinine, Ser: 1.22 mg/dL — ABNORMAL HIGH (ref 0.44–1.00)
GFR calc Af Amer: 48 mL/min — ABNORMAL LOW (ref >60)
GFR calc non Af Amer: 42 mL/min — ABNORMAL LOW (ref >60)
Glucose, Bld: 119 mg/dL — ABNORMAL HIGH (ref 70–99)
Potassium: 4.6 mmol/L (ref 3.5–5.1)
Sodium: 133 mmol/L — ABNORMAL LOW (ref 135–145)

## 2020-09-15 LAB — CBC
HCT: 27 % — ABNORMAL LOW (ref 36.0–46.0)
Hemoglobin: 8.9 g/dL — ABNORMAL LOW (ref 12.0–15.0)
MCH: 32.7 pg (ref 26.0–34.0)
MCHC: 33 g/dL (ref 30.0–36.0)
MCV: 99.3 fL (ref 80.0–100.0)
Platelets: 323 10*3/uL (ref 150–400)
RBC: 2.72 MIL/uL — ABNORMAL LOW (ref 3.87–5.11)
RDW: 13.9 % (ref 11.5–15.5)
WBC: 7.7 10*3/uL (ref 4.0–10.5)
nRBC: 0 % (ref 0.0–0.2)

## 2020-09-15 LAB — GLUCOSE, CAPILLARY: Glucose-Capillary: 124 mg/dL — ABNORMAL HIGH (ref 70–99)

## 2020-09-15 MED ORDER — AMLODIPINE BESYLATE 5 MG PO TABS
5.0000 mg | ORAL_TABLET | Freq: Every day | ORAL | Status: DC
  Administered 2020-09-15 – 2020-09-16 (×2): 5 mg via ORAL
  Filled 2020-09-15 (×2): qty 1

## 2020-09-15 NOTE — Plan of Care (Signed)

## 2020-09-15 NOTE — Progress Notes (Signed)
  Subjective: Patient stable.  Pain controlled   Objective: Vital signs in last 24 hours: Temp:  [97.3 F (36.3 C)-98.9 F (37.2 C)] 98.2 F (36.8 C) (09/19 0903) Pulse Rate:  [72-107] 97 (09/19 0903) Resp:  [10-16] 16 (09/19 0903) BP: (80-146)/(45-60) 125/53 (09/19 0903) SpO2:  [95 %-100 %] 96 % (09/19 0903)  Intake/Output from previous day: 09/18 0701 - 09/19 0700 In: 1250 [I.V.:1000; IV Piggyback:250] Out: 1150 [Urine:800; Blood:350] Intake/Output this shift: No intake/output data recorded.  Exam:  Sensation intact distally Intact pulses distally  Labs: Recent Labs    09/13/20 2054 09/15/20 0618  HGB 11.0* 8.9*   Recent Labs    09/13/20 2054 09/15/20 0618  WBC 13.7* 7.7  RBC 3.41* 2.72*  HCT 33.4* 27.0*  PLT 400 323   Recent Labs    09/13/20 2054 09/15/20 0618  NA 133* 133*  K 4.3 4.6  CL 99 101  CO2 22 23  BUN 16 16  CREATININE 1.06* 1.22*  GLUCOSE 142* 119*  CALCIUM 9.3 8.4*   Recent Labs    09/13/20 2054  INR 1.1    Assessment/Plan: Plan at this time is mobilization with physical therapy weightbearing as tolerated.  May or may not need skilled nursing home placement.  We will see how she is doing tomorrow.  Patient does have acute blood loss anemia with hemoglobin 8.9 today.   Landry Dyke Prakriti Carignan 09/15/2020, 9:33 AM

## 2020-09-15 NOTE — Progress Notes (Signed)
A consult was placed to IV Therapy for a new iv site;  Pt is no longer on iv medications,and has taken po pain med;  Spoke w RN, regarding vein preservation, and no longer placing ivs for "just in case;"   Will restart iv if pt requires iv pain medication, and after 2 RNs have assessed for access.  Thank you.

## 2020-09-15 NOTE — Plan of Care (Signed)
°  Problem: Activity: Goal: Ability to avoid complications of mobility impairment will improve Outcome: Progressing Goal: Ability to tolerate increased activity will improve Outcome: Progressing   Problem: Education: Goal: Verbalization of understanding the information provided will improve Outcome: Progressing   Problem: Coping: Goal: Level of anxiety will decrease Outcome: Progressing   Problem: Physical Regulation: Goal: Postoperative complications will be avoided or minimized Outcome: Progressing   Problem: Respiratory: Goal: Ability to maintain a clear airway will improve Outcome: Progressing   Problem: Respiratory: Goal: Ability to maintain a clear airway will improve Outcome: Progressing   Problem: Pain Management: Goal: Pain level will decrease Outcome: Progressing   Problem: Skin Integrity: Goal: Signs of wound healing will improve Outcome: Progressing   Problem: Tissue Perfusion: Goal: Ability to maintain adequate tissue perfusion will improve Outcome: Progressing

## 2020-09-15 NOTE — Evaluation (Addendum)
Physical Therapy Evaluation Patient Details Name: Rhonda [name] MRN: [id number] DOB: [birth year] Today's Date: 09/15/2020   History of Present Illness  Rhonda [name] is a 79 year old female with past medical history notable for rheumatoid arthritis, hypothyroidism, essential hypertension, anxiety who presented to the ED with left hip pain following a fall.  Upon presentation to the ED, pelvic x-ray notable for acute left femoral neck fracture with mild varus impaction. S/p LTHA Anterior Approach, WBAT  Clinical Impression   Patient is s/p above surgery resulting in functional limitations due to the deficits listed below (see PT Problem List). Comes from home where she lives with her husband in a two story house with a few steps to enter; Indpendent at baseline, and enjoys gardening; Presents to PT with decr functional mobility, decr activity tolerance; required min/mod assist for mobility today; I anticipate good progress;  Patient will benefit from skilled PT to increase their independence and safety with mobility to allow discharge to the venue listed below.       Follow Up Recommendations Home health PT;Supervision/Assistance - 24 hour    Equipment Recommendations  Rolling walker with 5" wheels;3in1 (PT)    Recommendations for Other Services OT consult (as ordered)     Precautions / Restrictions Precautions Precautions: Fall Precaution Comments: Fall risk greatly reduced wit use of RW Restrictions LLE Weight Bearing: Weight bearing as tolerated      Mobility  Bed Mobility Overal bed mobility: Needs Assistance Bed Mobility: Supine to Sit     Supine to sit: Min assist     General bed mobility comments: cues for techqnieu; min assist to help LLE to EOB; Min handheld assist to pull to sit  Transfers Overall transfer level: Needs assistance Equipment used: Rolling walker (2 wheeled) Transfers: Sit to/from Stand Sit to Stand: Mod assist;+2 safety/equipment          General transfer comment: Light mod assist to power up to sit and steady at initial stand  Ambulation/Gait Ambulation/Gait assistance: Min guard Gait Distance (Feet): 10 Feet Assistive device: Rolling walker (2 wheeled) Gait Pattern/deviations: Step-through pattern (emerging)     General Gait Details: Cues for sequence and to self-monitor for activity tolerance  Stairs            Wheelchair Mobility    Modified Rankin (Stroke Patients Only)       Balance Overall balance assessment: Needs assistance   Sitting balance-Leahy Scale: Fair       Standing balance-Leahy Scale: Poor                               Pertinent Vitals/Pain Pain Assessment: 0-10 Pain Score: 4  Pain Location: LLE Pain Descriptors / Indicators: Aching;Grimacing Pain Intervention(s): Monitored during session    Home Living Family/patient expects to be discharged to:: Private residence Living Arrangements: Spouse/significant other Available Help at Discharge: Family;Available 24 hours/day Type of Home: House Home Access: Stairs to enter Entrance Stairs-Rails: Right (needs to be verified) Entrance Stairs-Number of Steps: 3 Home Layout: Two level;Able to live on main level with bedroom/bathroom Home Equipment: None      Prior Function Level of Independence: Independent         Comments: Enjoys gardening     Hand Dominance        Extremity/Trunk Assessment   Upper Extremity Assessment Upper Extremity Assessment: Defer to OT evaluation    Lower Extremity Assessment Lower Extremity Assessment: LLE deficits/detail LLE Deficits /  Details: Grossly decr AROM and strength, limited by pain and edema postop       Communication   Communication: No difficulties  Cognition Arousal/Alertness: Awake/alert Behavior During Therapy: WFL for tasks assessed/performed Overall Cognitive Status: Within Functional Limits for tasks assessed                                         General Comments General comments (skin integrity, edema, etc.): Husband present entire session and helpful    Exercises     Assessment/Plan    PT Assessment Patient needs continued PT services  PT Problem List Decreased strength;Decreased range of motion;Decreased activity tolerance;Decreased balance;Decreased mobility;Decreased knowledge of use of DME;Decreased knowledge of precautions;Pain;Obesity       PT Treatment Interventions DME instruction;Gait training;Stair training;Functional mobility training;Therapeutic activities;Therapeutic exercise;Balance training;Patient/family education    PT Goals (Current goals can be found in the Care Plan section)  Acute Rehab PT Goals Patient Stated Goal: Hopes to be able to go home soon PT Goal Formulation: With patient Time For Goal Achievement: 09/29/20 Potential to Achieve Goals: Good    Frequency Min 6X/week   Barriers to discharge        Co-evaluation               AM-PAC PT "6 Clicks" Mobility  Outcome Measure Help needed turning from your back to your side while in a flat bed without using bedrails?: A Little Help needed moving from lying on your back to sitting on the side of a flat bed without using bedrails?: A Lot Help needed moving to and from a bed to a chair (including a wheelchair)?: A Little Help needed standing up from a chair using your arms (e.g., wheelchair or bedside chair)?: A Lot Help needed to walk in hospital room?: A Little Help needed climbing 3-5 steps with a railing? : A Lot 6 Click Score: 15    End of Session Equipment Utilized During Treatment: Gait belt Activity Tolerance: Patient tolerated treatment well Patient left: in chair;with call bell/phone within reach;with chair alarm set Nurse Communication: Mobility status PT Visit Diagnosis: Unsteadiness on feet (R26.81);Other abnormalities of gait and mobility (R26.89);Pain Pain - Right/Left: Left Pain - part of body: Leg     Time:  09/15/20 1700  PT Time Calculation  PT Start Time (ACUTE ONLY) 1011  PT Stop Time (ACUTE ONLY) 1031  PT Time Calculation (min) (ACUTE ONLY) 20 min  PT General Charges  $$ ACUTE PT VISIT 1 Visit       Charges:   PT Evaluation $PT Eval Moderate Complexity: 1 Mod         Roney Marion, PT  Acute Rehabilitation Services Pager [phone] Office [phone]   [place]ta [place] 09/15/2020, 6:03 PM

## 2020-09-15 NOTE — Progress Notes (Signed)
PROGRESS NOTE    Rhonda [name]  SWF:[id number] DOB: [birth year] DOA: 09/13/2020 PCP: Leeroy Cha, MD    Brief Narrative:  Rhonda [name] is a 79 year old female with past medical history notable for rheumatoid arthritis, hypothyroidism, essential hypertension, anxiety who presented to the ED with left hip pain following a fall.  Upon presentation to the ED, pelvic x-ray notable for acute left femoral neck fracture with mild varus impaction.  Orthopedics was consulted.  TRH consulted for admission.   Assessment & Plan:   Principal Problem:   Closed left hip fracture (HCC) Active Problems:   HTN (hypertension)   Rheumatoid arthritis ([place])   Hypothyroidism   Left displaced femoral neck fracture Patient presenting to the ED following mechanical fall at home with left hip pain and inability to ambulate.  Imaging studies notable for left femoral neck fracture. Patient underwent anterior approach total hip arthroplasty on 09/14/2020 by Dr. Marlou Sa. --Weightbearing as tolerated left lower extremity --DVT prophylaxis with aspirin 81 mg p.o. twice daily per orthopedics --Oxycodone 5-10 mg every 4 hours as needed moderate pain --Dilaudid 0.5 mg IV every 4 hours as needed severe breakthrough pain --Pending PT/OT evaluation  Acute postoperative blood loss anemia --Hgb 11.0>8.9 --Transfuse hemoglobin <7.0, or active bleeding --Repeat CBC in the a.m.  Essential hypertension On amlodipine 5 mg p.o. daily, olmesartan 20 mg p.o. daily at home.   --BP 146/60 this am --Restart amlodipine 5 mg p.o. daily --Continue to hold olmesartan for now. --Continue to monitor BP closely  Rheumatoid arthritis --Methotrexate 12.5 mg p.o. weekly on Friday --Hydroxychloroquine 200 mg p.o. twice daily --Folic acid 1 mg p.o. daily  Hypothyroidism --Levothyroxine 25 mcg p.o. daily  Anxiety --Paxil 20 mg p.o. daily  Hyperlipidemia: Simvastatin 40 mg p.o. daily  GERD: Continue PPI   DVT  prophylaxis: SCDs, aspirin 81 mg p.o. twice daily per orthopedics Code Status: Full code Family Communication: Updated patient extensively at bedside, updated patients husband, Ram in the hallway this morning  Disposition Plan:  Status is: Inpatient  Remains inpatient appropriate because:Ongoing active pain requiring inpatient pain management, Ongoing diagnostic testing needed not appropriate for outpatient work up, Unsafe d/c plan, IV treatments appropriate due to intensity of illness or inability to take PO and Inpatient level of care appropriate due to severity of illness   Dispo: The patient is from: Home              Anticipated d/c is to: To be determined              Anticipated d/c date is: 3 days              Patient currently is not medically stable to d/c.   Consultants:   Orthopedics  Procedures:   Total hip arthroplasty anterior approach, Dr. Marlou Sa on 09/14/2020  Antimicrobials:   Perioperative cefazolin   Subjective: Patient seen and examined bedside, resting comfortably.  Pain controlled. Total hip arthroplasty yesterday. Husband want to know next steps, discussed need for therapy evaluation to determine if she is a SNF versus home health candidate. Patient without any questions or concerns at this time. Denies headache, no fever/chills/night sweats, no nausea/vomiting/diarrhea, no chest pain, palpitations, no shortness of breath, no abdominal pain.  No acute events overnight per nursing staff.  Objective: Vitals:   09/15/20 0004 09/15/20 0413 09/15/20 0903 09/15/20 0943  BP: (!) 137/57 (!) 146/60 (!) 125/53   Pulse: (!) 107 (!) 103 97   Resp: 15 15 16    Temp:  98.6 F (37 C) 98.8 F (37.1 C) 98.2 F (36.8 C)   TempSrc: Oral Oral Oral   SpO2: 95% 98% 96%   Weight:    90.7 kg  Height:    5\' 4"  (1.626 m)    Intake/Output Summary (Last 24 hours) at 09/15/2020 1139 Last data filed at 09/14/2020 1612 Gross per 24 hour  Intake 1250 ml  Output 1150 ml  Net 100  ml   Filed Weights   09/15/20 0943  Weight: 90.7 kg    Examination:  General exam: Appears calm and comfortable  Respiratory system: Clear to auscultation. Respiratory effort normal.  Oxygen well on room air Cardiovascular system: S1 & S2 heard, RRR. No JVD, murmurs, rubs, gallops or clicks. No pedal edema. Gastrointestinal system: Abdomen is nondistended, soft and nontender. No organomegaly or masses felt. Normal bowel sounds heard. Central nervous system: Alert and oriented. No focal neurological deficits. Extremities: Surgical dressing left lower extremity noted, clean/dry/intact, neurovascular intact Skin: No rashes, lesions or ulcers Psychiatry: Judgement and insight appear normal. Mood & affect appropriate.    Data Reviewed: I have personally reviewed following labs and imaging studies  CBC: Recent Labs  Lab 09/13/20 2054 09/15/20 0618  WBC 13.7* 7.7  NEUTROABS 11.7*  --   HGB 11.0* 8.9*  HCT 33.4* 27.0*  MCV 97.9 99.3  PLT 400 381   Basic Metabolic Panel: Recent Labs  Lab 09/13/20 2054 09/15/20 0618  NA 133* 133*  K 4.3 4.6  CL 99 101  CO2 22 23  GLUCOSE 142* 119*  BUN 16 16  CREATININE 1.06* 1.22*  CALCIUM 9.3 8.4*   GFR: Estimated Creatinine Clearance: 40.1 mL/min (A) (by C-G formula based on SCr of 1.22 mg/dL (H)). Liver Function Tests: No results for input(s): AST, ALT, ALKPHOS, BILITOT, PROT, ALBUMIN in the last 168 hours. No results for input(s): LIPASE, AMYLASE in the last 168 hours. No results for input(s): AMMONIA in the last 168 hours. Coagulation Profile: Recent Labs  Lab 09/13/20 2054  INR 1.1   Cardiac Enzymes: No results for input(s): CKTOTAL, CKMB, CKMBINDEX, TROPONINI in the last 168 hours. BNP (last 3 results) No results for input(s): PROBNP in the last 8760 hours. HbA1C: No results for input(s): HGBA1C in the last 72 hours. CBG: No results for input(s): GLUCAP in the last 168 hours. Lipid Profile: No results for input(s):  CHOL, HDL, LDLCALC, TRIG, CHOLHDL, LDLDIRECT in the last 72 hours. Thyroid Function Tests: No results for input(s): TSH, T4TOTAL, FREET4, T3FREE, THYROIDAB in the last 72 hours. Anemia Panel: No results for input(s): VITAMINB12, FOLATE, FERRITIN, TIBC, IRON, RETICCTPCT in the last 72 hours. Sepsis Labs: No results for input(s): PROCALCITON, LATICACIDVEN in the last 168 hours.  Recent Results (from the past 240 hour(s))  SARS Coronavirus 2 by RT PCR (hospital order, performed in [facility] hospital lab) Nasopharyngeal Nasopharyngeal Swab     Status: None   Collection Time: 09/13/20 10:33 PM   Specimen: Nasopharyngeal Swab  Result Value Ref Range Status   SARS Coronavirus 2 NEGATIVE NEGATIVE Final    Comment: (NOTE) SARS-CoV-2 target nucleic acids are NOT DETECTED.  The SARS-CoV-2 RNA is generally detectable in upper and lower respiratory specimens during the acute phase of infection. The lowest concentration of SARS-CoV-2 viral copies this assay can detect is 250 copies / mL. A negative result does not preclude SARS-CoV-2 infection and should not be used as the sole basis for treatment or other patient management decisions.  A negative result may occur  with improper specimen collection / handling, submission of specimen other than nasopharyngeal swab, presence of viral mutation(s) within the areas targeted by this assay, and inadequate number of viral copies (<250 copies / mL). A negative result must be combined with clinical observations, patient history, and epidemiological information.  Fact Sheet for Patients:   StrictlyIdeas.no  Fact Sheet for Healthcare Providers: BankingDealers.co.za  This test is not yet approved or  cleared by the [place] FDA and has been authorized for detection and/or diagnosis of SARS-CoV-2 by FDA under an Emergency Use Authorization (EUA).  This EUA will remain in effect (meaning this test can be  used) for the duration of the COVID-19 declaration under Section 564(b)(1) of the Act, 21 U.S.C. section 360bbb-3(b)(1), unless the authorization is terminated or revoked sooner.  Performed at [facility], [place] [address], [place], [place] [address]   MRSA PCR Screening     Status: None   Collection Time: 09/14/20  5:04 AM   Specimen: Nasal Mucosa; Nasopharyngeal  Result Value Ref Range Status   MRSA by PCR NEGATIVE NEGATIVE Final    Comment:        The GeneXpert MRSA Assay (FDA approved for NASAL specimens only), is one component of a comprehensive MRSA colonization surveillance program. It is not intended to diagnose MRSA infection nor to guide or monitor treatment for MRSA infections. Performed at [facility], [place] [address], [place], [place] [address]          Radiology Studies: DG Pelvis Portable  Result Date: 09/14/2020 CLINICAL DATA:  Hip replacement EXAM: PORTABLE PELVIS 1-2 VIEWS COMPARISON:  None. FINDINGS: The patient is status post left total hip arthroplasty. No periprosthetic lucency or fracture is identified. Overlying subcutaneous emphysema and edema is noted. 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 IMPRESSION: Status post left total hip arthroplasty without acute complication. Electronically Signed   By: Prudencio Pair M.D.   On: 09/14/2020 18:34   DG Chest Port 1 View  Result Date: 09/13/2020 CLINICAL DATA:  79 year old female with hip fracture. EXAM: PORTABLE CHEST 1 VIEW COMPARISON:  Chest CT 04/11/2020 and earlier. FINDINGS: Portable AP semi upright view at 2105 hours. Stable lung volumes. Stable moderate elevation of the right hemidiaphragm. Mediastinal contours remain normal. Visualized tracheal air column is within normal limits. Allowing for portable technique the lungs are clear. No acute osseous abnormality identified. IMPRESSION: No acute cardiopulmonary abnormality. Electronically Signed   By: Genevie Dyani M.D.   On: 09/13/2020 21:17    DG C-Arm 1-60 Min  Result Date: 09/14/2020 CLINICAL DATA:  Anterior left hip arthroplasty EXAM: DG C-ARM 1-60 MIN; OPERATIVE LEFT HIP WITH PELVIS COMPARISON:  None. FINDINGS: Two in intraop images were submitted for review of a left total hip arthroplasty. Fluoro time 33 seconds IMPRESSION: Status post left total hip arthroplasty intraop views Electronically Signed   By: Prudencio Pair M.D.   On: 09/14/2020 15:44   DG HIP OPERATIVE UNILAT W OR W/O PELVIS LEFT  Result Date: 09/14/2020 CLINICAL DATA:  Anterior left hip arthroplasty EXAM: DG C-ARM 1-60 MIN; OPERATIVE LEFT HIP WITH PELVIS COMPARISON:  None. FINDINGS: Two in intraop images were submitted for review of a left total hip arthroplasty. Fluoro time 33 seconds IMPRESSION: Status post left total hip arthroplasty intraop views Electronically Signed   By: Prudencio Pair M.D.   On: 09/14/2020 15:44   DG Hip Unilat W or Wo Pelvis 2-3 Views Left  Result Date: 09/13/2020 CLINICAL DATA:  79 year old female  status post fall with left hip and thigh pain. EXAM: DG HIP (WITH OR WITHOUT PELVIS) 2-3V LEFT COMPARISON:  CT Abdomen and Pelvis 12/15/2018. FINDINGS: Both femoral heads remain normally located but there is an acute left femoral neck fracture. The fracture is mildly impacted, mild varus angulation. The intertrochanteric segment appears to remain intact. No superimposed fracture of the pelvis is identified. Grossly intact proximal right femur. Negative visible lower abdominal and pelvic visceral contours. IMPRESSION: Acute left femoral neck fracture with mild varus impaction. Electronically Signed   By: Genevie Lihanna M.D.   On: 09/13/2020 20:53        Scheduled Meds: . acetaminophen  1,000 mg Oral Q6H  . aspirin  81 mg Oral BID  . docusate sodium  100 mg Oral BID  . folic acid  9,983 mcg Oral Daily  . hydroxychloroquine  200 mg Oral BID  . influenza vaccine adjuvanted  0.5 mL Intramuscular Tomorrow-1000  . levothyroxine  25 mcg Oral QAC breakfast   . [START ON 09/20/2020] methotrexate  12.5 mg Oral Q Fri  . pantoprazole  40 mg Oral Daily  . PARoxetine  20 mg Oral Daily  . simvastatin  40 mg Oral QHS   Continuous Infusions:    LOS: 2 days    Time spent: 36 minutes spent on chart review, discussion with nursing staff, consultants, updating family and interview/physical exam; more than 50% of that time was spent in counseling and/or coordination of care.    Shateka Petrea J [place] DO [facility] Available via Epic secure chat 7am-7pm After these hours, please refer to coverage provider listed on [email or web address] 09/15/2020, 11:39 AM

## 2020-09-16 ENCOUNTER — Encounter (HOSPITAL_COMMUNITY): Payer: Self-pay | Admitting: Orthopedic Surgery

## 2020-09-16 DIAGNOSIS — D62 Acute posthemorrhagic anemia: Secondary | ICD-10-CM | POA: Diagnosis not present

## 2020-09-16 LAB — CBC
HCT: 25.3 % — ABNORMAL LOW (ref 36.0–46.0)
Hemoglobin: 8.3 g/dL — ABNORMAL LOW (ref 12.0–15.0)
MCH: 32.3 pg (ref 26.0–34.0)
MCHC: 32.8 g/dL (ref 30.0–36.0)
MCV: 98.4 fL (ref 80.0–100.0)
Platelets: 354 10*3/uL (ref 150–400)
RBC: 2.57 MIL/uL — ABNORMAL LOW (ref 3.87–5.11)
RDW: 13.9 % (ref 11.5–15.5)
WBC: 9.6 10*3/uL (ref 4.0–10.5)
nRBC: 0 % (ref 0.0–0.2)

## 2020-09-16 LAB — BASIC METABOLIC PANEL
Anion gap: 7 (ref 5–15)
BUN: 15 mg/dL (ref 8–23)
CO2: 26 mmol/L (ref 22–32)
Calcium: 8.7 mg/dL — ABNORMAL LOW (ref 8.9–10.3)
Chloride: 102 mmol/L (ref 98–111)
Creatinine, Ser: 0.94 mg/dL (ref 0.44–1.00)
GFR calc Af Amer: >60 mL/min (ref >60)
GFR calc non Af Amer: 57 mL/min — ABNORMAL LOW (ref >60)
Glucose, Bld: 118 mg/dL — ABNORMAL HIGH (ref 70–99)
Potassium: 4.2 mmol/L (ref 3.5–5.1)
Sodium: 135 mmol/L (ref 135–145)

## 2020-09-16 MED ORDER — ASPIRIN 81 MG PO CHEW: 81.0000 mg | CHEWABLE_TABLET | Freq: Two times a day (BID) | ORAL | 0 refills | Status: AC

## 2020-09-16 MED ORDER — OXYCODONE HCL 5 MG PO TABS: 5.0000 mg | ORAL_TABLET | ORAL | 0 refills | Status: AC | PRN

## 2020-09-16 NOTE — Discharge Summary (Signed)
Physician Discharge Summary  DANGELA HOW DZH:[id number] DOB: 1941-05-03 DOA: 09/13/2020  PCP: Leeroy Cha, MD  Admit date: 09/13/2020 Discharge date: 09/16/2020  Admitted From: Home Disposition: Home  Recommendations for Outpatient Follow-up:  1. Follow up with PCP in 1-2 weeks 2. Follow-up with orthopedics, Dr. Marlou Sa as scheduled for postoperative wound check 3. Please obtain CBC in one week for postoperative anemia  Home Health: PT/OT Equipment/Devices: Walker, 3:1 bedside commode, tub bench  Discharge Condition: Stable CODE STATUS: Full code Diet recommendation: Heart healthy diet  History of present illness:  Rhonda [name] is a 79 year old female with past medical history notable for rheumatoid arthritis, hypothyroidism, essential hypertension, anxiety who presented to the ED with left hip pain following a fall.  Upon presentation to the ED, pelvic x-ray notable for acute left femoral neck fracture with mild varus impaction.  Orthopedics was consulted.  TRH consulted for admission.  Hospital course:  Left displaced femoral neck fracture Patient presenting to the ED following mechanical fall at home with left hip pain and inability to ambulate.  Imaging studies notable for left femoral neck fracture. Patient underwent anterior approach total hip arthroplasty on 09/14/2020 by Dr. Marlou Sa.  Patient is weightbearing as tolerated left lower extremity.  Patient work with physical/occupational therapy with recommendations of home health with walker, tub bench, 3 and 1 bedside commode.  Continue postoperative DVT prophylaxis with aspirin 81 mg p.o. twice daily per orthopedics.  Pain control with oxycodone.  Outpatient follow-up with orthopedics.  Acute postoperative blood loss anemia Hemoglobin 11.0 on admission, postoperatively dropped to 8.3.  Appears stable.  Recommend repeat CBC in 1 week.  Essential hypertension Continue home amlodipine 5 mg p.o. daily, olmesartan 20 mg  p.o. daily at home.     Rheumatoid arthritis Continue methotrexate 12.5 mg p.o. weekly on Friday, Hydroxychloroquine 200 mg p.o. twice daily, Folic acid 1 mg p.o. daily  Hypothyroidism Continue levothyroxine 25 mcg p.o. daily  Anxiety Continue Paxil 20 mg p.o. daily  Hyperlipidemia: Simvastatin 40 mg p.o. daily  GERD: Continue PPI  Discharge Diagnoses:  Active Problems:   HTN (hypertension)   Rheumatoid arthritis (HCC)   Hypothyroidism   Postoperative anemia due to acute blood loss    Discharge Instructions  Discharge Instructions    Call MD for:  difficulty breathing, headache or visual disturbances   Complete by: As directed    Call MD for:  extreme fatigue   Complete by: As directed    Call MD for:  persistant dizziness or light-headedness   Complete by: As directed    Call MD for:  persistant nausea and vomiting   Complete by: As directed    Call MD for:  redness, tenderness, or signs of infection (pain, swelling, redness, odor or green/yellow discharge around incision site)   Complete by: As directed    Call MD for:  temperature >100.4   Complete by: As directed    Diet - low sodium heart healthy   Complete by: As directed    Increase activity slowly   Complete by: As directed    No wound care   Complete by: As directed      Allergies as of 09/16/2020      Reactions   Aleve [naproxen] Other (See Comments)   GI upset      Medication List    TAKE these medications   acetaminophen 650 MG CR tablet Commonly known as: TYLENOL Take 1,300 mg by mouth every 8 (eight) hours as needed for pain.  amLODipine 5 MG tablet Commonly known as: NORVASC Take 5 mg by mouth daily.   aspirin 81 MG chewable tablet Chew 1 tablet (81 mg total) by mouth 2 (two) times daily.   CALCIUM-MAGNESIUM-ZINC-D3 PO Take 1 tablet by mouth 2 (two) times daily.   clobetasol cream 0.05 % Commonly known as: TEMOVATE Apply 1 application topically 2 (two) times daily as needed  (irritation).   estradiol 0.1 MG/GM vaginal cream Commonly known as: ESTRACE Place 1 Applicatorful vaginally daily as needed (for vaginal discomfort/dryness).   folic acid 597 MCG tablet Commonly known as: FOLVITE Take 800 mcg by mouth daily.   hydroxychloroquine 200 MG tablet Commonly known as: PLAQUENIL Take 200 mg by mouth 2 (two) times daily.   levothyroxine 25 MCG tablet Commonly known as: SYNTHROID Take 25 mcg by mouth daily before breakfast.   melatonin 5 MG Tabs Take 5 mg by mouth at bedtime.   methotrexate 2.5 MG tablet Commonly known as: RHEUMATREX Take 12.5 mg by mouth every Friday.   MOVE FREE PO Take 1 capsule by mouth 2 (two) times daily.   multivitamin with minerals Tabs tablet Take 1 tablet by mouth daily.   olmesartan 20 MG tablet Commonly known as: BENICAR Take 20 mg by mouth daily.   omeprazole 40 MG capsule Commonly known as: PRILOSEC Take 40 mg by mouth daily.   oxyCODONE 5 MG immediate release tablet Commonly known as: Oxy IR/ROXICODONE Take 1 tablet (5 mg total) by mouth every 4 (four) hours as needed for up to 7 days for moderate pain.   PARoxetine 20 MG tablet Commonly known as: PAXIL Take 20 mg by mouth daily.   polyethylene glycol 17 g packet Commonly known as: MIRALAX / GLYCOLAX Take 17 g by mouth daily.   PROBIOTIC DAILY PO Take 1 capsule by mouth daily.   Refresh 1.4-0.6 % Soln Generic drug: Polyvinyl Alcohol-Povidone PF Place 1 drop into both eyes daily.   Refresh Liquigel 1 % Gel Generic drug: Carboxymethylcellulose Sodium Place 1 drop into both eyes at bedtime.   simvastatin 40 MG tablet Commonly known as: ZOCOR Take 40 mg by mouth at bedtime.   TURMERIC PO Take 2 capsules by mouth daily.   Vitamin D 50 MCG (2000 UT) Caps Take 2,000 Units by mouth daily.   Voltaren 1 % Gel Generic drug: diclofenac Sodium Apply 1 application topically 2 (two) times daily as needed (arthritis pain).            Durable  Medical Equipment  (From admission, onward)         Start     Ordered   09/16/20 1014  For home use only DME Tub bench  Once        09/16/20 1013   09/16/20 0736  For home use only DME 3 n 1  Once        09/16/20 0735   09/16/20 0736  For home use only DME Walker  Once       Question:  Patient needs a walker to treat with the following condition  Answer:  Gait disturbance   09/16/20 0735          Follow-up Information    Leeroy Cha, MD. Schedule an appointment as soon as possible for a visit in 1 week(s).   Specialty: Internal Medicine Contact information: [address] [place] [address] [phone]        Meredith Pel, MD. Schedule an appointment as soon as possible for  a visit in 1 week(s).   Specialty: Orthopedic Surgery Contact information: [address] [place] [address] [phone]              Allergies  Allergen Reactions  . Aleve [Naproxen] Other (See Comments)    GI upset    Consultations:  Orthopedics, Dr. Marlou Sa   Procedures/Studies: DG Pelvis Portable  Result Date: 09/14/2020 CLINICAL DATA:  Hip replacement EXAM: PORTABLE PELVIS 1-2 VIEWS COMPARISON:  None. FINDINGS: The patient is status post left total hip arthroplasty. No periprosthetic lucency or fracture is identified. Overlying subcutaneous emphysema and edema is noted. 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 IMPRESSION: Status post left total hip arthroplasty without acute complication. Electronically Signed   By: Prudencio Pair M.D.   On: 09/14/2020 18:34   DG Chest Port 1 View  Result Date: 09/13/2020 CLINICAL DATA:  79 year old female with hip fracture. EXAM: PORTABLE CHEST 1 VIEW COMPARISON:  Chest CT 04/11/2020 and earlier. FINDINGS: Portable AP semi upright view at 2105 hours. Stable lung volumes. Stable moderate elevation of the right hemidiaphragm. Mediastinal contours remain normal. Visualized tracheal air column is within normal  limits. Allowing for portable technique the lungs are clear. No acute osseous abnormality identified. IMPRESSION: No acute cardiopulmonary abnormality. Electronically Signed   By: Genevie Anecia M.D.   On: 09/13/2020 21:17   DG C-Arm 1-60 Min  Result Date: 09/14/2020 CLINICAL DATA:  Anterior left hip arthroplasty EXAM: DG C-ARM 1-60 MIN; OPERATIVE LEFT HIP WITH PELVIS COMPARISON:  None. FINDINGS: Two in intraop images were submitted for review of a left total hip arthroplasty. Fluoro time 33 seconds IMPRESSION: Status post left total hip arthroplasty intraop views Electronically Signed   By: Prudencio Pair M.D.   On: 09/14/2020 15:44   DG HIP OPERATIVE UNILAT W OR W/O PELVIS LEFT  Result Date: 09/14/2020 CLINICAL DATA:  Anterior left hip arthroplasty EXAM: DG C-ARM 1-60 MIN; OPERATIVE LEFT HIP WITH PELVIS COMPARISON:  None. FINDINGS: Two in intraop images were submitted for review of a left total hip arthroplasty. Fluoro time 33 seconds IMPRESSION: Status post left total hip arthroplasty intraop views Electronically Signed   By: Prudencio Pair M.D.   On: 09/14/2020 15:44   DG Hip Unilat W or Wo Pelvis 2-3 Views Left  Result Date: 09/13/2020 CLINICAL DATA:  79 year old female status post fall with left hip and thigh pain. EXAM: DG HIP (WITH OR WITHOUT PELVIS) 2-3V LEFT COMPARISON:  CT Abdomen and Pelvis 12/15/2018. FINDINGS: Both femoral heads remain normally located but there is an acute left femoral neck fracture. The fracture is mildly impacted, mild varus angulation. The intertrochanteric segment appears to remain intact. No superimposed fracture of the pelvis is identified. Grossly intact proximal right femur. Negative visible lower abdominal and pelvic visceral contours. IMPRESSION: Acute left femoral neck fracture with mild varus impaction. Electronically Signed   By: Genevie Aisley M.D.   On: 09/13/2020 20:53      Subjective: Patient seen and examined bedside, resting comfortably in bedside chair.  Spouse and  Occupational Therapy present.  Pain controlled.  Patient did well yesterday with physical therapy with recommendation of home health, OT in agreement today.  Spouse's questions answered at bedside.  Ready for discharge home.  Patient denies headache, no dizziness, no chest pain, no palpitations, no abdominal pain, no weakness.  No acute events overnight per nursing staff.  Discharge Exam: Vitals:   09/16/20 0428 09/16/20 0820  BP: 126/68 (!) 119/52  Pulse: 98 93  Resp: 14  18  Temp: 99 F (37.2 C) 98.3 F (36.8 C)  SpO2: 94% 96%   Vitals:   09/15/20 2003 09/16/20 0017 09/16/20 0428 09/16/20 0820  BP: (!) 117/48 (!) 122/52 126/68 (!) 119/52  Pulse: (!) 107 (!) 103 98 93  Resp: 18 16 14 18   Temp: 98.6 F (37 C) 98.9 F (37.2 C) 99 F (37.2 C) 98.3 F (36.8 C)  TempSrc: Oral Oral Oral Oral  SpO2: 96% 93% 94% 96%  Weight:      Height:        General: Pt is alert, awake, not in acute distress Cardiovascular: RRR, S1/S2 +, no rubs, no gallops Respiratory: CTA bilaterally, no wheezing, no rhonchi, oxygenating well on room air Abdominal: Soft, NT, ND, bowel sounds + Extremities: no edema, no cyanosis, surgical dressings in place, clean/dry/intact    The results of significant diagnostics from this hospitalization (including imaging, microbiology, ancillary and laboratory) are listed below for reference.     Microbiology: Recent Results (from the past 240 hour(s))  SARS Coronavirus 2 by RT PCR (hospital order, performed in [facility] hospital lab) Nasopharyngeal Nasopharyngeal Swab     Status: None   Collection Time: 09/13/20 10:33 PM   Specimen: Nasopharyngeal Swab  Result Value Ref Range Status   SARS Coronavirus 2 NEGATIVE NEGATIVE Final    Comment: (NOTE) SARS-CoV-2 target nucleic acids are NOT DETECTED.  The SARS-CoV-2 RNA is generally detectable in upper and lower respiratory specimens during the acute phase of infection. The lowest concentration of SARS-CoV-2 viral  copies this assay can detect is 250 copies / mL. A negative result does not preclude SARS-CoV-2 infection and should not be used as the sole basis for treatment or other patient management decisions.  A negative result may occur with improper specimen collection / handling, submission of specimen other than nasopharyngeal swab, presence of viral mutation(s) within the areas targeted by this assay, and inadequate number of viral copies (<250 copies / mL). A negative result must be combined with clinical observations, patient history, and epidemiological information.  Fact Sheet for Patients:   StrictlyIdeas.no  Fact Sheet for Healthcare Providers: BankingDealers.co.za  This test is not yet approved or  cleared by the [place] FDA and has been authorized for detection and/or diagnosis of SARS-CoV-2 by FDA under an Emergency Use Authorization (EUA).  This EUA will remain in effect (meaning this test can be used) for the duration of the COVID-19 declaration under Section 564(b)(1) of the Act, 21 U.S.C. section 360bbb-3(b)(1), unless the authorization is terminated or revoked sooner.  Performed at [facility], [place] [address], [place], [place] [address]   MRSA PCR Screening     Status: None   Collection Time: 09/14/20  5:04 AM   Specimen: Nasal Mucosa; Nasopharyngeal  Result Value Ref Range Status   MRSA by PCR NEGATIVE NEGATIVE Final    Comment:        The GeneXpert MRSA Assay (FDA approved for NASAL specimens only), is one component of a comprehensive MRSA colonization surveillance program. It is not intended to diagnose MRSA infection nor to guide or monitor treatment for MRSA infections. Performed at [facility], [place] [address] [place], [place] [address]      Labs: BNP (last 3 results) No results for input(s): BNP in the last 8760 hours. Basic Metabolic Panel: Recent Labs  Lab 09/13/20 2054  09/15/20 0618 09/16/20 0159  NA 133* 133* 135  K 4.3 4.6 4.2  CL 99 101  102  CO2 22 23 26   GLUCOSE 142* 119* 118*  BUN 16 16 15   CREATININE 1.06* 1.22* 0.94  CALCIUM 9.3 8.4* 8.7*   Liver Function Tests: No results for input(s): AST, ALT, ALKPHOS, BILITOT, PROT, ALBUMIN in the last 168 hours. No results for input(s): LIPASE, AMYLASE in the last 168 hours. No results for input(s): AMMONIA in the last 168 hours. CBC: Recent Labs  Lab 09/13/20 2054 09/15/20 0618 09/16/20 0159  WBC 13.7* 7.7 9.6  NEUTROABS 11.7*  --   --   HGB 11.0* 8.9* 8.3*  HCT 33.4* 27.0* 25.3*  MCV 97.9 99.3 98.4  PLT 400 323 354   Cardiac Enzymes: No results for input(s): CKTOTAL, CKMB, CKMBINDEX, TROPONINI in the last 168 hours. BNP: Invalid input(s): POCBNP CBG: Recent Labs  Lab 09/15/20 2019  GLUCAP 124*   D-Dimer No results for input(s): DDIMER in the last 72 hours. Hgb A1c No results for input(s): HGBA1C in the last 72 hours. Lipid Profile No results for input(s): CHOL, HDL, LDLCALC, TRIG, CHOLHDL, LDLDIRECT in the last 72 hours. Thyroid function studies No results for input(s): TSH, T4TOTAL, T3FREE, THYROIDAB in the last 72 hours.  Invalid input(s): FREET3 Anemia work up No results for input(s): VITAMINB12, FOLATE, FERRITIN, TIBC, IRON, RETICCTPCT in the last 72 hours. Urinalysis No results found for: COLORURINE, APPEARANCEUR, [place], [place], [place], [place], [place], [place], PROTEINUR, UROBILINOGEN, NITRITE, LEUKOCYTESUR Sepsis Labs Invalid input(s): PROCALCITONIN,  WBC,  LACTICIDVEN Microbiology Recent Results (from the past 240 hour(s))  SARS Coronavirus 2 by RT PCR (hospital order, performed in [facility] hospital lab) Nasopharyngeal Nasopharyngeal Swab     Status: None   Collection Time: 09/13/20 10:33 PM   Specimen: Nasopharyngeal Swab  Result Value Ref Range Status   SARS Coronavirus 2 NEGATIVE NEGATIVE Final    Comment: (NOTE) SARS-CoV-2 target nucleic acids are  NOT DETECTED.  The SARS-CoV-2 RNA is generally detectable in upper and lower respiratory specimens during the acute phase of infection. The lowest concentration of SARS-CoV-2 viral copies this assay can detect is 250 copies / mL. A negative result does not preclude SARS-CoV-2 infection and should not be used as the sole basis for treatment or other patient management decisions.  A negative result may occur with improper specimen collection / handling, submission of specimen other than nasopharyngeal swab, presence of viral mutation(s) within the areas targeted by this assay, and inadequate number of viral copies (<250 copies / mL). A negative result must be combined with clinical observations, patient history, and epidemiological information.  Fact Sheet for Patients:   StrictlyIdeas.no  Fact Sheet for Healthcare Providers: BankingDealers.co.za  This test is not yet approved or  cleared by the [place] FDA and has been authorized for detection and/or diagnosis of SARS-CoV-2 by FDA under an Emergency Use Authorization (EUA).  This EUA will remain in effect (meaning this test can be used) for the duration of the COVID-19 declaration under Section 564(b)(1) of the Act, 21 U.S.C. section 360bbb-3(b)(1), unless the authorization is terminated or revoked sooner.  Performed at [facility], [place] [address] [place], [place] [address]   MRSA PCR Screening     Status: None   Collection Time: 09/14/20  5:04 AM   Specimen: Nasal Mucosa; Nasopharyngeal  Result Value Ref Range Status   MRSA by PCR NEGATIVE NEGATIVE Final    Comment:        The GeneXpert MRSA Assay (FDA approved for NASAL specimens only), is one component of a comprehensive MRSA  colonization surveillance program. It is not intended to diagnose MRSA infection nor to guide or monitor treatment for MRSA infections. Performed at [facility], [place] [address] [place], [place] [address]      Time coordinating discharge: Over 30 minutes  SIGNED:   Donnamarie Poag [place] DO  [facility] 09/16/2020, 10:20 AM

## 2020-09-16 NOTE — Plan of Care (Signed)
  Problem: Education: Goal: Verbalization of understanding the information provided (i.e., activity precautions, restrictions, etc) will improve Outcome: Adequate for Discharge   Problem: Activity: Goal: Ability to ambulate and perform ADLs will improve Outcome: Adequate for Discharge   Problem: Pain Management: Goal: Pain level will decrease Outcome: Adequate for Discharge

## 2020-09-16 NOTE — TOC Transition Note (Signed)
Transition of Care [facility]) - CM/SW Discharge Note   Patient Details  Name: Rhonda [name] MRN: [id number] Date of Birth: 04-04-1941  [facility] CM/SW Contact:  Sharin Mons, RN Phone Number: [phone] 09/16/2020, 4:26 PM   Clinical Narrative:    Patient will DC to: home Anticipated DC date: 09/16/2020 Family notified: yes, husband Transport by: car  Admitted s/p fall, suffered Closed left hip fracture full range of motion home with husband. PTA independent with ADL's , no DME usage.          - S/p LTHA, 9/18  Per MD patient ready for DC today . RN, patient, and patient's husband of DC. NCM shared PT/OT recommendations: Home health PT/OT;Supervision/Assistance - 24 hour. Pt agreeable to [facility] services. Pt without preference. Referral made with [facility] and accepted. Per pt she will have 24/7 supervision and assistance once from family. DME : rolling walker and bsc/ 3 in 1 will be delivered to room prior to d/c. Pt without Rx med concerns or affordability Pt with post hospital f/u noted on AVS.    RNCM will sign off for now as intervention is no longer needed. Please consult [place] again if new needs arise.    Final next level of care: Home w Home Health Services Barriers to Discharge: No Barriers Identified   Patient Goals and CMS Choice Patient states their goals for this hospitalization and ongoing recovery are:: to get better   Choice offered to / list presented to : Patient  Discharge Placement                       Discharge Plan and Services   Discharge Planning Services: CM Consult Post Acute Care Choice: Durable Medical Equipment, Home Health          DME Arranged: 3-N-1, Walker rolling DME Agency: [employer] Date DME Agency Contacted: 09/16/20 Time DME Agency Contacted: 1400 Representative spoke with at DME Agency: given from floor stock by nurse HH Arranged: PT, OT [place] Agency: [place] Date [place]:  09/16/20 Time [place]: 1625 Representative spoke with at [place]: [place] Interventions     Readmission Risk Interventions No flowsheet data found.

## 2020-09-16 NOTE — Progress Notes (Signed)
Physical Therapy Treatment Patient Details Name: Rhonda [name] MRN: [id number] DOB: 10/27/1941 Today's Date: 09/16/2020    History of Present Illness Rhonda [name] is a 79 year old female with past medical history notable for rheumatoid arthritis, hypothyroidism, essential hypertension, anxiety who presented to the ED with left hip pain following a fall.  Upon presentation to the ED, pelvic x-ray notable for acute left femoral neck fracture with mild varus impaction. S/p LTHA Anterior Approach, WBAT    PT Comments    Pt making good progress with mobility. Has difficulty getting started after lying or prolonged sitting but improved as she increased ambulation distance. Ambulated 54' with RW and supervision. Pt practiced 2 steps bkwd with RW and husband and they were given a handout with pictures to assist with getting in home. Will plan to follow pt tomorrow if she does no d/c home today to progress ambulation distance.     Follow Up Recommendations  Home health PT;Supervision/Assistance - 24 hour     Equipment Recommendations  Rolling walker with 5" wheels;3in1 (PT)    Recommendations for Other Services       Precautions / Restrictions Precautions Precautions: Fall Precaution Comments: Fall risk greatly reduced with use of RW Restrictions Weight Bearing Restrictions: Yes LLE Weight Bearing: Weight bearing as tolerated    Mobility  Bed Mobility               General bed mobility comments: up in recliner   Transfers Overall transfer level: Needs assistance Equipment used: Rolling walker (2 wheeled) Transfers: Sit to/from Stand Sit to Stand: Supervision         General transfer comment: increased time needed due to pain but no physical assist given  Ambulation/Gait Ambulation/Gait assistance: Supervision Gait Distance (Feet): 30 Feet Assistive device: Rolling walker (2 wheeled) Gait Pattern/deviations: Step-through pattern;Decreased weight shift to  left;Decreased stance time - left Gait velocity: decreased Gait velocity interpretation: <1.31 ft/sec, indicative of household ambulator General Gait Details: cues to take wt through UE's with R step to decrease pain on LLE. Improved with distance   Stairs Stairs: Yes Stairs assistance: Min assist Stair Management: No rails;Backwards;With walker Number of Stairs: 2 General stair comments: exhausting for pt but she was able to complete 2 steps bkwd with husband and RW and supervision from therapist. Handout given for them to review before going in the home.    Wheelchair Mobility    Modified Rankin (Stroke Patients Only)       Balance Overall balance assessment: Needs assistance Sitting-balance support: No upper extremity supported;Feet supported Sitting balance-Leahy Scale: Good     Standing balance support: During functional activity;Single extremity supported Standing balance-Leahy Scale: Fair Standing balance comment: Static standing without UE support to change out RW for her to use one she will take home. Use of RW for dynamic tasks                            Cognition Arousal/Alertness: Awake/alert Behavior During Therapy: WFL for tasks assessed/performed Overall Cognitive Status: Within Functional Limits for tasks assessed                                 General Comments: did have some difficulty remembering sequencing for stairs but handout given to husband      Exercises Total Joint Exercises Ankle Circles/Pumps: AROM;Both;20 reps;Seated Quad Sets: AROM;Both;10 reps;Seated Straight Leg Raises: AROM;Left;5  reps;Seated (attempting but unable) Long Arc Quad: AROM;Left;10 reps;Seated    General Comments General comments (skin integrity, edema, etc.): husband present and participating in session. Case manager calling daughter after session to see if she could give additional support to couple as well      Pertinent Vitals/Pain Pain  Assessment: Faces Faces Pain Scale: Hurts even more Pain Location: LLE Pain Descriptors / Indicators: Aching;Grimacing Pain Intervention(s): Limited activity within patient's tolerance;Monitored during session    Home Living                      Prior Function            PT Goals (current goals can now be found in the care plan section) Acute Rehab PT Goals Patient Stated Goal: return home PT Goal Formulation: With patient Time For Goal Achievement: 09/29/20 Potential to Achieve Goals: Good Progress towards PT goals: Progressing toward goals    Frequency    Min 6X/week      PT Plan Current plan remains appropriate    Co-evaluation              AM-PAC PT "6 Clicks" Mobility   Outcome Measure  Help needed turning from your back to your side while in a flat bed without using bedrails?: A Little Help needed moving from lying on your back to sitting on the side of a flat bed without using bedrails?: A Little Help needed moving to and from a bed to a chair (including a wheelchair)?: A Little Help needed standing up from a chair using your arms (e.g., wheelchair or bedside chair)?: A Lot Help needed to walk in hospital room?: A Little Help needed climbing 3-5 steps with a railing? : A Little 6 Click Score: 17    End of Session Equipment Utilized During Treatment: Gait belt Activity Tolerance: Patient tolerated treatment well Patient left: in chair;with call bell/phone within reach;with family/visitor present Nurse Communication: Mobility status PT Visit Diagnosis: Unsteadiness on feet (R26.81);Other abnormalities of gait and mobility (R26.89);Pain Pain - Right/Left: Left Pain - part of body: Leg     Time: 9735-3299 PT Time Calculation (min) (ACUTE ONLY): 30 min  Charges:  $Gait Training: 23-37 mins                     [place]  Pager [phone] Office [place] 09/16/2020, 2:46  PM

## 2020-09-16 NOTE — Anesthesia Postprocedure Evaluation (Signed)
Anesthesia Post Note  Patient: Rhonda [name]  Procedure(s) Performed: HIP ARTHROPLASTY ANTERIOR APPROACH (Left Hip)     Patient location during evaluation: PACU Anesthesia Type: Spinal Level of consciousness: awake and alert Pain management: pain level controlled Vital Signs Assessment: post-procedure vital signs reviewed and stable Respiratory status: spontaneous breathing and respiratory function stable Cardiovascular status: blood pressure returned to baseline and stable Postop Assessment: spinal receding Anesthetic complications: no   No complications documented.  Last Vitals:  Vitals:   09/16/20 0428 09/16/20 0820  BP: 126/68 (!) 119/52  Pulse: 98 93  Resp: 14 18  Temp: 37.2 C 36.8 C  SpO2: 94% 96%    Last Pain:  Vitals:   09/16/20 0820  TempSrc: Oral  PainSc:                  Nolon Nations

## 2020-09-16 NOTE — Evaluation (Signed)
Occupational Therapy Evaluation Patient Details Name: Rhonda [name] MRN: [id number] DOB: 1941/04/22 Today's Date: 09/16/2020    History of Present Illness Rhonda [name] is a 79 year old female with past medical history notable for rheumatoid arthritis, hypothyroidism, essential hypertension, anxiety who presented to the ED with left hip pain following a fall.  Upon presentation to the ED, pelvic x-ray notable for acute left femoral neck fracture with mild varus impaction. S/p LTHA Anterior Approach, WBAT   Clinical Impression   PTA, pt lives with husband and reports Independence with all ADLs, iADLs and mobility without use of AD. Pt present now s/p surgery with L LE weakness, decreased endurance/dynamic balance, and pain. Pt demonstrates slow pace with mobility to/from bathroom with RW at Supervision level for safety, but no physical assist needed. Pt requires up to [place] for LB ADLs due to deficits. Provided education to pt and husband on DME uses, assistance level at home, and safety techniques with both verbalizing understanding. Recommend HHOT& 24/7 supervision initially, along with the DME listed below.     Follow Up Recommendations  Home health OT;Supervision/Assistance - 24 hour    Equipment Recommendations  3 in 1 bedside commode;Other (comment);Tub/shower seat (RW)    Recommendations for Other Services       Precautions / Restrictions Precautions Precautions: Fall Precaution Comments: Fall risk greatly reduced with use of RW Restrictions Weight Bearing Restrictions: Yes LLE Weight Bearing: Weight bearing as tolerated      Mobility Bed Mobility               General bed mobility comments: up in recliner   Transfers Overall transfer level: Needs assistance Equipment used: Rolling walker (2 wheeled) Transfers: Sit to/from [facility] Sit to Stand: Supervision Stand pivot transfers: Supervision       General transfer comment: Supervision, no  assist needed and good hand placement noted     Balance Overall balance assessment: Needs assistance Sitting-balance support: No upper extremity supported;Feet supported Sitting balance-Leahy Scale: Good     Standing balance support: During functional activity;Single extremity supported Standing balance-Leahy Scale: Fair Standing balance comment: Static standing without UE support to wash hands/don hospital gown good. Use of RW for dynamic tasks                           ADL either performed or assessed with clinical judgement   ADL Overall ADL's : Needs assistance/impaired Eating/Feeding: Independent;Sitting   Grooming: Modified independent;Standing;Wash/dry hands Grooming Details (indicate cue type and reason): Modified Independent for hand hygiene standing at sink Upper Body Bathing: Independent;Sitting   Lower Body Bathing: Minimal assistance;Sit to/from stand   Upper Body Dressing : Standing;Set up Upper Body Dressing Details (indicate cue type and reason): Setup for donning hospital gown in standing  Lower Body Dressing: Minimal assistance;Sit to/from stand Lower Body Dressing Details (indicate cue type and reason): Min A for donning/doffing around afffected LE. Educated husband on dressing tasks that may require assistance. Pt reports she can wear dresses as needed to eliminate issue as needed  Toilet Transfer: Supervision/safety;Ambulation;BSC;Regular Toilet;RW Toilet Transfer Details (indicate cue type and reason): Supervision for slow steady gait to/from bathroom with RW, no LOB or safety concerns. BSC over toilet Toileting- Clothing Manipulation and Hygiene: Supervision/safety;Sit to/from stand Toileting - Clothing Manipulation Details (indicate cue type and reason): Supervision for toileting task, no physical assist needd     Functional mobility during ADLs: Supervision/safety;Rolling walker General ADL Comments: Pt with  mild deficits in pain in L LE,  endurance and knowledge of use of DME. Pt requires minor assist with LB ADLs due to deficits      Vision Baseline Vision/History: Wears glasses Wears Glasses: Reading only (doesnt really wear distance glasses) Patient Visual Report: No change from baseline Vision Assessment?: No apparent visual deficits     Perception     Praxis      Pertinent Vitals/Pain Pain Assessment: 0-10 Pain Score: 8  Pain Location: LLE Pain Descriptors / Indicators: Aching;Grimacing Pain Intervention(s): Monitored during session;Limited activity within patient's tolerance (Notified RN)     Hand Dominance Right   Extremity/Trunk Assessment Upper Extremity Assessment Upper Extremity Assessment: Overall WFL for tasks assessed   Lower Extremity Assessment Lower Extremity Assessment: Defer to PT evaluation LLE Deficits / Details: Grossly decr AROM and strength, limited by pain and edema postop   Cervical / Trunk Assessment Cervical / Trunk Assessment: Normal   Communication Communication Communication: No difficulties   Cognition Arousal/Alertness: Awake/alert Behavior During Therapy: WFL for tasks assessed/performed Overall Cognitive Status: Within Functional Limits for tasks assessed                                     General Comments  Husband entering at end of session, as well as MD. Educated pt and husband on DME use and recommendations. Husband reports glass doors on walk in shower with small step - may be difficult moving BSC in/out for showering tasks. Educated to place [facility] over toilet to ease hip pain/positioning     Exercises     Shoulder Instructions      Home Living Family/patient expects to be discharged to:: Private residence Living Arrangements: Spouse/significant other Available Help at Discharge: Family;Available 24 hours/day Type of Home: House Home Access: Stairs to enter [employer] of Steps: 1 + 1 (entering through the garage) Entrance  Stairs-Rails: None (none at garage) Home Layout: Two level;Able to live on main level with bedroom/bathroom     Bathroom Shower/Tub: Walk-in shower;Tub/shower unit   Bathroom Toilet: Standard     Home Equipment: None          Prior Functioning/Environment Level of Independence: Independent        Comments: Independent with ADLs, IADLs and mobility without AD. Pt enjoys gardening and playing games with family/friends (chinese checkers, Skip Bo, sequence)         OT Problem List: Decreased strength;Decreased activity tolerance;Impaired balance (sitting and/or standing);Decreased knowledge of use of DME or AE;Pain      OT Treatment/Interventions: Self-care/ADL training;Therapeutic exercise;Energy conservation;DME and/or AE instruction;Patient/family education;Therapeutic activities    OT Goals(Current goals can be found in the care plan section) Acute Rehab OT Goals Patient Stated Goal: pt hopes to be able to go home today OT Goal Formulation: With patient Time For Goal Achievement: 09/30/20 Potential to Achieve Goals: Good ADL Goals Pt Will Perform Lower Body Bathing: with modified independence;sit to/from stand;sitting/lateral leans Pt Will Perform Lower Body Dressing: with modified independence;sit to/from stand;sitting/lateral leans Pt Will Transfer to Toilet: with modified independence;ambulating;bedside commode;regular height toilet Pt Will Perform Tub/Shower Transfer: Shower transfer;with modified independence;ambulating;shower seat;rolling walker  OT Frequency: Min 2X/week   Barriers to D/C:            Co-evaluation              AM-PAC OT "6 Clicks" Daily Activity     Outcome Measure Help  from another person eating meals?: None Help from another person taking care of personal grooming?: None Help from another person toileting, which includes using toliet, bedpan, or urinal?: A Little Help from another person bathing (including washing, rinsing, drying)?:  A Little Help from another person to put on and taking off regular upper body clothing?: A Little Help from another person to put on and taking off regular lower body clothing?: A Little 6 Click Score: 20   End of Session Equipment Utilized During Treatment: Gait belt;Rolling walker Nurse Communication: Mobility status  Activity Tolerance: Patient tolerated treatment well Patient left: in chair;with call bell/phone within reach;with family/visitor present;Other (comment) (with MD present)  OT Visit Diagnosis: Unsteadiness on feet (R26.81);Other abnormalities of gait and mobility (R26.89);Muscle weakness (generalized) (M62.81);Pain Pain - Right/Left: Left Pain - part of body: Hip                Time: 3013-1438 OT Time Calculation (min): 30 min Charges:  OT General Charges $OT Visit: 1 Visit OT Evaluation $OT Eval Moderate Complexity: 1 Mod OT Treatments $Self Care/Home Management : 8-22 mins  Layla Maw, OTR/L  Layla Maw 09/16/2020, 9:37 AM

## 2020-09-16 NOTE — Progress Notes (Addendum)
  Subjective: Rhonda [name] is a 79 y.o. female s/p left THA.  They are POD2.  Pt's pain is controlled.  Pt has ambulated around her room with some difficulty.  She notes that she is ready for discharge home.    Objective: Vital signs in last 24 hours: Temp:  [98.3 F (36.8 C)-99 F (37.2 C)] 98.3 F (36.8 C) (09/20 0820) Pulse Rate:  [93-107] 93 (09/20 0820) Resp:  [14-18] 18 (09/20 0820) BP: (117-126)/(48-68) 119/52 (09/20 0820) SpO2:  [93 %-96 %] 96 % (09/20 0820)  Intake/Output from previous day: No intake/output data recorded. Intake/Output this shift: Total I/O In: 360 [P.O.:360] Out: -   Exam:  No gross blood or drainage overlying the dressing 2+ DP pulse Sensation intact distally in the left foot Able to dorsiflex and plantarflex the left foot   Labs: Recent Labs    09/13/20 2054 09/15/20 0618 09/16/20 0159  HGB 11.0* 8.9* 8.3*   Recent Labs    09/15/20 0618 09/16/20 0159  WBC 7.7 9.6  RBC 2.72* 2.57*  HCT 27.0* 25.3*  PLT 323 354   Recent Labs    09/15/20 0618 09/16/20 0159  NA 133* 135  K 4.6 4.2  CL 101 102  CO2 23 26  BUN 16 15  CREATININE 1.22* 0.94  GLUCOSE 119* 118*  CALCIUM 8.4* 8.7*   Recent Labs    09/13/20 2054  INR 1.1    Assessment/Plan: Pt is POD2 s/p left THA.    -Plan to discharge to home today pending PT eval  -WBAT with a walker  -Follow-up with Dr. Marlou Sa in clinic ~2 weeks postop.  DVT prophylaxis of ASA 81mg  BID     [name] 09/16/2020, 12:54 PM

## 2020-09-25 ENCOUNTER — Ambulatory Visit (INDEPENDENT_AMBULATORY_CARE_PROVIDER_SITE_OTHER): Payer: Medicare Other | Admitting: Orthopedic Surgery

## 2020-09-25 ENCOUNTER — Ambulatory Visit: Payer: Self-pay

## 2020-09-25 DIAGNOSIS — Z96649 Presence of unspecified artificial hip joint: Secondary | ICD-10-CM

## 2020-09-29 ENCOUNTER — Encounter: Payer: Self-pay | Admitting: Orthopedic Surgery

## 2020-09-29 NOTE — Progress Notes (Signed)
Post-Op Visit Note   Patient: Rhonda [name]           Date of Birth: 1941/05/09           MRN: [id number] Visit Date: 09/25/2020 PCP: Leeroy Cha, MD   Assessment & Plan:  Chief Complaint:  Chief Complaint  Patient presents with  . Left Hip - Routine Post Op   Visit Diagnoses:  1. History of total hip replacement, unspecified laterality     Plan: And is a patient who is now about 2 weeks out left total hip replacement for hip fracture.  Been walking with a walker.  Strength is improving.  She is at home.  On exam she has a little bit of breakdown in the middle portion of the incision.  All Steri-Strips are removed today.  There is no fluctuance or erythema.  I would not apply dry dressing and have her continue with home health PT come back in 10 days just for recheck on the incision.  She is having no fevers or chills.  Has good hip flexion and abduction strength bilaterally.  Follow-Up Instructions: No follow-ups on file.   Orders:  Orders Placed This Encounter  Procedures  . XR HIP UNILAT W OR W/O PELVIS 2-3 VIEWS LEFT   No orders of the defined types were placed in this encounter.   Imaging: No results found.  PMFS History: Patient Active Problem List   Diagnosis Date Noted  . Postoperative anemia due to acute blood loss 09/16/2020  . HTN (hypertension) 09/13/2020  . Rheumatoid arthritis ([place]) 09/13/2020  . Hypothyroidism 09/13/2020  . 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 Upper airway cough syndrome 11/11/2016   Past Medical History:  Diagnosis Date  . Anxiety    pt. may have panicy feeling upon awaking from anesth. - states she feels anxious at times  & has used Paxil & xanax in the [place] but its been a very long time ago   . Arthritis    rheumatoid & OA-hands, shoulders   . Dyspnea   . GERD (gastroesophageal reflux disease)   . Heart murmur   . Hypertension     Family History  Problem Relation Age of Onset  . Breast cancer Daughter   . Breast  cancer Maternal Grandfather     Past Surgical History:  Procedure Laterality Date  . APPENDECTOMY     laproscopic  . COLONOSCOPY    . HEMORROIDECTOMY    . INSERTION OF MESH N/A 09/12/2018   Procedure: INSERTION OF MESH;  Surgeon: Jovita Kussmaul, MD;  Location: [place];  Service: General;  Laterality: N/A;  . LAPAROSCOPIC APPENDECTOMY N/A 01/06/2017   Procedure: LAPAROSCOPIC APPENDECTOMY;  Surgeon: Autumn Messing III, MD;  Location: [facility];  Service: General;  Laterality: N/A;  . TONSILLECTOMY     as a child  . 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   Procedure: HIP ARTHROPLASTY ANTERIOR APPROACH;  Surgeon: Meredith Pel, MD;  Location: [place] [facility];  Service: Orthopedics;  Laterality: Left;  Mar[place] Kitchen VAGINAL DELIVERY     x2  . VENTRAL HERNIA REPAIR N/A 09/12/2018   Procedure: LAPAROSCOPIC VENTRAL HERNIA REPAIR WITH MESH;  Surgeon: Jovita Kussmaul, MD;  Location: [facility];  Service: General;  Laterality: N/A;  . VIDEO BRONCHOSCOPY Bilateral 11/07/2019   Procedure: VIDEO BRONCHOSCOPY WITH FLUORO;  Surgeon: Chesley Mires, MD;  Location: [facility];  Service: Endoscopy;  Laterality: Bilateral;   Social History   Occupational History  . Not on file  Tobacco Use  . Smoking status: Never Smoker  . Smokeless tobacco: Never Used  Substance and Sexual Activity  . Alcohol use: Yes    Comment: daily- wine & bourbon & ginger   . Drug use: No  . Sexual activity: Not on file

## 2020-09-30 ENCOUNTER — Telehealth: Payer: Self-pay | Admitting: Cardiology

## 2020-09-30 ENCOUNTER — Other Ambulatory Visit: Payer: Self-pay

## 2020-09-30 ENCOUNTER — Other Ambulatory Visit (HOSPITAL_COMMUNITY): Payer: Self-pay | Admitting: Internal Medicine

## 2020-09-30 ENCOUNTER — Ambulatory Visit (HOSPITAL_COMMUNITY)
Admission: RE | Admit: 2020-09-30 | Discharge: 2020-09-30 | Disposition: A | Discharge status: Home / Self Care | Payer: Medicare Other | Source: Ambulatory Visit | Attending: Cardiology | Admitting: Cardiology

## 2020-09-30 DIAGNOSIS — M79605 Pain in left leg: Secondary | ICD-10-CM | POA: Diagnosis present

## 2020-09-30 NOTE — Telephone Encounter (Signed)
Once [facility] Physician would like the report of her LE venous faxed over to them at [phone]

## 2020-10-01 NOTE — Telephone Encounter (Signed)
Spoke to [employer]- she states she will fax results to [place] office. Scheduler states she has fax number. Verified correct  fax number.

## 2020-10-04 ENCOUNTER — Encounter: Payer: Self-pay | Admitting: Surgical

## 2020-10-04 ENCOUNTER — Ambulatory Visit (INDEPENDENT_AMBULATORY_CARE_PROVIDER_SITE_OTHER): Payer: Medicare Other | Admitting: Surgical

## 2020-10-04 DIAGNOSIS — Z96649 Presence of unspecified artificial hip joint: Secondary | ICD-10-CM

## 2020-10-04 NOTE — Progress Notes (Signed)
Post-Op Visit Note   Patient: Rhonda [name]           Date of Birth: [birth year]           MRN: [id number] Visit Date: 10/04/2020 PCP: Leeroy Cha, MD   Assessment & Plan:  Chief Complaint:  Chief Complaint  Patient presents with  . Left Hip - Follow-up   Visit Diagnoses:  1. History of total hip replacement, unspecified laterality     Plan: Patient is an 79 year old female who presents s/p left total hip arthroplasty on 09/14/2020.  She returns for recheck of incision primarily.  Overall she is doing well with some occasional discomfort in her left hip.  She only takes Tylenol for pain and really only needs it after physical therapy with home therapist.  She is still doing home health physical therapy.  On exam, she has mild discomfort with internal rotation of the left hip as well as terminal left hip flexion but she is able to tolerate this well.  She walks well with no Trendelenburg gait.  Incision is healing without any evidence of infection or dehiscence.  No surrounding erythema.  Midportion of the incision does not have any gapping or expressible drainage.  She is walking with a walker and she feels comfortable walking and she has transition to using a cane at times.  Plan for to continue with home health physical therapy and start outpatient physical therapy next week.  Follow-up with Dr. Marlou Sa in 4 weeks for clinical recheck.  Patient agreed with plan.  Follow-Up Instructions: No follow-ups on file.   Orders:  No orders of the defined types were placed in this encounter.  No orders of the defined types were placed in this encounter.   Imaging: No results found.  PMFS History: Patient Active Problem List   Diagnosis Date Noted  . Postoperative anemia due to acute blood loss 09/16/2020  . HTN (hypertension) 09/13/2020  . Rheumatoid arthritis ([place]) 09/13/2020  . Hypothyroidism 09/13/2020  . 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 Upper airway cough syndrome  11/11/2016   Past Medical History:  Diagnosis Date  . Anxiety    pt. may have panicy feeling upon awaking from anesth. - states she feels anxious at times  & has used Paxil & xanax in the [place] but its been a very long time ago   . Arthritis    rheumatoid & OA-hands, shoulders   . Dyspnea   . GERD (gastroesophageal reflux disease)   . Heart murmur   . Hypertension     Family History  Problem Relation Age of Onset  . Breast cancer Daughter   . Breast cancer Maternal Grandfather     Past Surgical History:  Procedure Laterality Date  . APPENDECTOMY     laproscopic  . COLONOSCOPY    . HEMORROIDECTOMY    . INSERTION OF MESH N/A 09/12/2018   Procedure: INSERTION OF MESH;  Surgeon: Jovita Kussmaul, MD;  Location: [place] [facility];  Service: General;  Laterality: N/A;  . LAPAROSCOPIC APPENDECTOMY N/A 01/06/2017   Procedure: LAPAROSCOPIC APPENDECTOMY;  Surgeon: Autumn Messing III, MD;  Location: [place];  Service: General;  Laterality: N/A;  . TONSILLECTOMY     as a child  . 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   Procedure: HIP ARTHROPLASTY ANTERIOR APPROACH;  Surgeon: Meredith Pel, MD;  Location: [facility];  Service: Orthopedics;  Laterality: Left;  Marland Kitchen VAGINAL DELIVERY     x2  . VENTRAL HERNIA REPAIR N/A 09/12/2018  Procedure: LAPAROSCOPIC VENTRAL HERNIA REPAIR WITH MESH;  Surgeon: Jovita Kussmaul, MD;  Location: [facility];  Service: General;  Laterality: N/A;  . VIDEO BRONCHOSCOPY Bilateral 11/07/2019   Procedure: VIDEO BRONCHOSCOPY WITH FLUORO;  Surgeon: Chesley Mires, MD;  Location: [facility];  Service: Endoscopy;  Laterality: Bilateral;   Social History   Occupational History  . Not on file  Tobacco Use  . Smoking status: Never Smoker  . Smokeless tobacco: Never Used  Substance and Sexual Activity  . Alcohol use: Yes    Comment: daily- wine & bourbon & ginger   . Drug use: No  . Sexual activity: Not on file

## 2020-11-01 ENCOUNTER — Ambulatory Visit (INDEPENDENT_AMBULATORY_CARE_PROVIDER_SITE_OTHER): Payer: Medicare Other | Admitting: Surgical

## 2020-11-01 ENCOUNTER — Encounter: Payer: Self-pay | Admitting: Surgical

## 2020-11-01 DIAGNOSIS — Z96642 Presence of left artificial hip joint: Secondary | ICD-10-CM

## 2020-11-01 DIAGNOSIS — Z96649 Presence of unspecified artificial hip joint: Secondary | ICD-10-CM

## 2020-11-01 NOTE — Progress Notes (Signed)
Post-Op Visit Note   Patient: Rhonda [name]           Date of Birth: 01-24-1941           MRN: [id number] Visit Date: 11/01/2020 PCP: Leeroy Cha, MD   Assessment & Plan:  Chief Complaint:  Chief Complaint  Patient presents with  . Left Hip - Routine Post Op   Visit Diagnoses: No diagnosis found.  Plan: Patient is an 79 year old female presents s/p left total hip arthroplasty on 09/14/2020.  She states that she is doing well overall with some concerns about her incision.  She is working with physical therapy 2 times per week.  She is walking well in PT and she has discontinued using a walker.  She has transition to a cane that she does not use around the house but she does use when she goes out.  She only has to take occasional Tylenol for pain control.  She has not taken any medication in the last 3 nights for pain control.  On exam her incision has healed in excellent fashion both distally and proximally but the midportion of the incision has 1 area where the incision is struggling to heal fully.  There is no draining sinus and no expressible drainage from the incision.  No significant warmth is felt compared with other areas of her skin.  No pain no pain with passive range of motion of the left hip joint.  No pain with axial load of the left hip joint.  No calf tenderness.  Negative Homans' sign.  Plan for patient to continue physical therapy and she will stop physical therapy at the end of November.  She plans to transition to not using a cane at all next week.  Plan for patient to follow-up with Dr. Marlou Sa next Friday for recheck of her incision.  Discussed the red flag signs of infection and prompted patient to call the office if she experiences any of these symptoms.  Patient and husband agreed with plan.      Follow-Up Instructions: No follow-ups on file.   Orders:  No orders of the defined types were placed in this encounter.  No orders of the defined types were  placed in this encounter.   Imaging: No results found.  PMFS History: Patient Active Problem List   Diagnosis Date Noted  . Postoperative anemia due to acute blood loss 09/16/2020  . HTN (hypertension) 09/13/2020  . Rheumatoid arthritis ([place]) 09/13/2020  . Hypothyroidism 09/13/2020  . 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 Upper airway cough syndrome 11/11/2016   Past Medical History:  Diagnosis Date  . Anxiety    pt. may have panicy feeling upon awaking from anesth. - states she feels anxious at times  & has used Paxil & xanax in the [place] but its been a very long time ago   . Arthritis    rheumatoid & OA-hands, shoulders   . Dyspnea   . GERD (gastroesophageal reflux disease)   . Heart murmur   . Hypertension     Family History  Problem Relation Age of Onset  . Breast cancer Daughter   . Breast cancer Maternal Grandfather     Past Surgical History:  Procedure Laterality Date  . APPENDECTOMY     laproscopic  . COLONOSCOPY    . HEMORROIDECTOMY    . INSERTION OF MESH N/A 09/12/2018   Procedure: INSERTION OF MESH;  Surgeon: Jovita Kussmaul, MD;  Location: [facility];  Service: General;  Laterality: N/A;  . LAPAROSCOPIC APPENDECTOMY N/A 01/06/2017   Procedure: LAPAROSCOPIC APPENDECTOMY;  Surgeon: Autumn Messing III, MD;  Location: [facility];  Service: General;  Laterality: N/A;  . TONSILLECTOMY     as a child  . 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   Procedure: HIP ARTHROPLASTY ANTERIOR APPROACH;  Surgeon: Meredith Pel, MD;  Location: [facility];  Service: Orthopedics;  Laterality: Left;  Marland Kitchen VAGINAL DELIVERY     x2  . VENTRAL HERNIA REPAIR N/A 09/12/2018   Procedure: LAPAROSCOPIC VENTRAL HERNIA REPAIR WITH MESH;  Surgeon: Jovita Kussmaul, MD;  Location: [place];  Service: General;  Laterality: N/A;  . VIDEO BRONCHOSCOPY Bilateral 11/07/2019   Procedure: VIDEO BRONCHOSCOPY WITH FLUORO;  Surgeon: Chesley Mires, MD;  Location: [facility];  Service: Endoscopy;  Laterality: Bilateral;   Social  History   Occupational History  . Not on file  Tobacco Use  . Smoking status: Never Smoker  . Smokeless tobacco: Never Used  Substance and Sexual Activity  . Alcohol use: Yes    Comment: daily- wine & bourbon & ginger   . Drug use: No  . Sexual activity: Not on file

## 2020-11-08 ENCOUNTER — Ambulatory Visit (INDEPENDENT_AMBULATORY_CARE_PROVIDER_SITE_OTHER): Payer: Medicare Other | Admitting: Orthopedic Surgery

## 2020-11-08 ENCOUNTER — Other Ambulatory Visit: Payer: Self-pay

## 2020-11-08 DIAGNOSIS — Z96649 Presence of unspecified artificial hip joint: Secondary | ICD-10-CM

## 2020-11-09 ENCOUNTER — Encounter: Payer: Self-pay | Admitting: Orthopedic Surgery

## 2020-11-09 NOTE — Progress Notes (Signed)
Post-Op Visit Note   Patient: Rhonda [name]           Date of Birth: 31-Oct-1941           MRN: [id number] Visit Date: 11/08/2020 PCP: Leeroy Cha, MD   Assessment & Plan:  Chief Complaint:  Chief Complaint  Patient presents with  . Left Hip - Routine Post Op   Visit Diagnoses:  1. History of total hip replacement, unspecified laterality     Plan: Hands and 79-year-old patient underwent left total hip replacement for fracture 09/14/2020.  She has been doing well.  Full weightbearing.  Coming in to have a little recheck on the incision.  She has a 1 x 1 cm area of delayed incisional healing but there is no fluctuance or erythema or drainage.  Looks like an eschar which is granulating over.  Continue with strengthening.  Her hip flexion strength is about 5- out of 5 on the left 5+ out of 5 on the right.  Follow-up in 4 weeks for final check.  That will be primarily just to look at the incision.  Follow-Up Instructions: Return in about 4 weeks (around 12/06/2020).   Orders:  No orders of the defined types were placed in this encounter.  No orders of the defined types were placed in this encounter.   Imaging: No results found.  PMFS History: Patient Active Problem List   Diagnosis Date Noted  . Postoperative anemia due to acute blood loss 09/16/2020  . HTN (hypertension) 09/13/2020  . Rheumatoid arthritis ([place]) 09/13/2020  . Hypothyroidism 09/13/2020  . 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 Upper airway cough syndrome 11/11/2016   Past Medical History:  Diagnosis Date  . Anxiety    pt. may have panicy feeling upon awaking from anesth. - states she feels anxious at times  & has used Paxil & xanax in the [place] but its been a very long time ago   . Arthritis    rheumatoid & OA-hands, shoulders   . Dyspnea   . GERD (gastroesophageal reflux disease)   . Heart murmur   . Hypertension     Family History  Problem Relation Age of Onset  . Breast cancer Daughter   .  Breast cancer Maternal Grandfather     Past Surgical History:  Procedure Laterality Date  . APPENDECTOMY     laproscopic  . COLONOSCOPY    . HEMORROIDECTOMY    . INSERTION OF MESH N/A 09/12/2018   Procedure: INSERTION OF MESH;  Surgeon: Jovita Kussmaul, MD;  Location: [facility];  Service: General;  Laterality: N/A;  . LAPAROSCOPIC APPENDECTOMY N/A 01/06/2017   Procedure: LAPAROSCOPIC APPENDECTOMY;  Surgeon: Autumn Messing III, MD;  Location: [facility];  Service: General;  Laterality: N/A;  . TONSILLECTOMY     as a child  . 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   Procedure: HIP ARTHROPLASTY ANTERIOR APPROACH;  Surgeon: Meredith Pel, MD;  Location: [facility];  Service: Orthopedics;  Laterality: Left;  Marland Kitchen VAGINAL DELIVERY     x2  . VENTRAL HERNIA REPAIR N/A 09/12/2018   Procedure: LAPAROSCOPIC VENTRAL HERNIA REPAIR WITH MESH;  Surgeon: Jovita Kussmaul, MD;  Location: [facility];  Service: General;  Laterality: N/A;  . VIDEO BRONCHOSCOPY Bilateral 11/07/2019   Procedure: VIDEO BRONCHOSCOPY WITH FLUORO;  Surgeon: Chesley Mires, MD;  Location: [facility];  Service: Endoscopy;  Laterality: Bilateral;   Social History   Occupational History  . Not on file  Tobacco Use  .  Smoking status: Never Smoker  . Smokeless tobacco: Never Used  Substance and Sexual Activity  . Alcohol use: Yes    Comment: daily- wine & bourbon & ginger   . Drug use: No  . Sexual activity: Not on file

## 2020-12-06 ENCOUNTER — Ambulatory Visit (INDEPENDENT_AMBULATORY_CARE_PROVIDER_SITE_OTHER): Payer: Medicare Other | Admitting: Orthopedic Surgery

## 2020-12-06 ENCOUNTER — Other Ambulatory Visit: Payer: Self-pay

## 2020-12-06 ENCOUNTER — Ambulatory Visit (INDEPENDENT_AMBULATORY_CARE_PROVIDER_SITE_OTHER): Payer: Medicare Other

## 2020-12-06 DIAGNOSIS — M25552 Pain in left hip: Secondary | ICD-10-CM | POA: Diagnosis not present

## 2020-12-08 ENCOUNTER — Encounter: Payer: Self-pay | Admitting: Orthopedic Surgery

## 2020-12-08 NOTE — Progress Notes (Signed)
Post-Op Visit Note   Patient: Rhonda [name]           Date of Birth: 05/01/1941           MRN: [id number] Visit Date: 12/06/2020 PCP: Leeroy Cha, MD   Assessment & Plan:  Chief Complaint:  Chief Complaint  Patient presents with  . Left Hip - Routine Post Op   Visit Diagnoses:  1. Pain in left hip     Plan: Patient is a 79 year old female presents s/p left total hip arthroplasty on 09/14/2020. She states that she was doing very well following surgery until about 2 weeks ago when she got down on her knees under the sink trying to clean and then felt pain upon standing back up. She did not feel a pop. She is able to weight-bear on the extremity. Incision is healing well with small amount of eschar in the midportion of the incision as described at previous visit. She denies any fevers, chills, night sweats, drainage from the incision, numbness/tingling of the leg. She does have occasional low back pain on her left side that is new since the cleaning incident. Most of her pain is in the groin, lateral thigh, posterior thigh as well as some radiation into the distal anterior femur. She is taking occasional Tylenol which helps with pain. On exam she has no limp with walking and no Trendelenburg gait. This pain occasionally wakes her with pain. Radiographs of the left hip reviewed today show no significant change since prior radiographs with no acute changes. Plan for patient to follow-up in 3 weeks for clinical recheck.  Follow-Up Instructions: No follow-ups on file.   Orders:  Orders Placed This Encounter  Procedures  . XR HIP UNILAT W OR W/O PELVIS 2-3 VIEWS LEFT   No orders of the defined types were placed in this encounter.   Imaging: No results found.  PMFS History: Patient Active Problem List   Diagnosis Date Noted  . Postoperative anemia due to acute blood loss 09/16/2020  . HTN (hypertension) 09/13/2020  . Rheumatoid arthritis ([place] ) 09/13/2020  . Hypothyroidism  09/13/2020  . 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 Upper airway cough syndrome 11/11/2016   Past Medical History:  Diagnosis Date  . Anxiety    pt. may have panicy feeling upon awaking from anesth. - states she feels anxious at times  & has used Paxil & xanax in the [place] but its been a very long time ago   . Arthritis    rheumatoid & OA-hands, shoulders   . Dyspnea   . GERD (gastroesophageal reflux disease)   . Heart murmur   . Hypertension     Family History  Problem Relation Age of Onset  . Breast cancer Daughter   . Breast cancer Maternal Grandfather     Past Surgical History:  Procedure Laterality Date  . APPENDECTOMY     laproscopic  . COLONOSCOPY    . HEMORROIDECTOMY    . INSERTION OF MESH N/A 09/12/2018   Procedure: INSERTION OF MESH;  Surgeon: Jovita Kussmaul, MD;  Location: [facility];  Service: General;  Laterality: N/A;  . LAPAROSCOPIC APPENDECTOMY N/A 01/06/2017   Procedure: LAPAROSCOPIC APPENDECTOMY;  Surgeon: Autumn Messing III, MD;  Location: [facility];  Service: General;  Laterality: N/A;  . TONSILLECTOMY     as a child  . 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   Procedure: HIP ARTHROPLASTY ANTERIOR APPROACH;  Surgeon: Meredith Pel, MD;  Location: [facility];  Service: Orthopedics;  Laterality: Left;  Marland Kitchen VAGINAL DELIVERY     x2  . VENTRAL HERNIA REPAIR N/A 09/12/2018   Procedure: LAPAROSCOPIC VENTRAL HERNIA REPAIR WITH MESH;  Surgeon: Jovita Kussmaul, MD;  Location: [facility];  Service: General;  Laterality: N/A;  . VIDEO BRONCHOSCOPY Bilateral 11/07/2019   Procedure: VIDEO BRONCHOSCOPY WITH FLUORO;  Surgeon: Chesley Mires, MD;  Location: [facility];  Service: Endoscopy;  Laterality: Bilateral;   Social History   Occupational History  . Not on file  Tobacco Use  . Smoking status: Never Smoker  . Smokeless tobacco: Never Used  Substance and Sexual Activity  . Alcohol use: Yes    Comment: daily- wine & bourbon & ginger   . Drug use: No  . Sexual activity: Not on file

## 2020-12-30 ENCOUNTER — Ambulatory Visit: Payer: Medicare Other | Admitting: Orthopedic Surgery

## 2021-01-12 IMAGING — CT CT CHEST W/ CM
1 series · 15 of 34 positions shown, 19 images · IV contrast (APPLIED)
Comparison: Chest CT of 06/20/2015, abdomen and pelvis CT of
12/15/2018

CLINICAL DATA: Chronic cough since last year, no surgery.

EXAM:
CT CHEST WITH CONTRAST
TECHNIQUE: Multidetector CT imaging of the chest was performed during
intravenous contrast administration.
Creatinine was obtained on site at [HOSPITAL] at [HOSPITAL].
Results: Creatinine 1.0 mg/dL.
CONTRAST:  75mL PVKBFM-VEE IOPAMIDOL (PVKBFM-VEE) INJECTION 61%

[Series 2: chest w/cm · axial · 0.81mm/px · z∈[-334,-66]mm · 15 of 158 slices shown, 19 images]
[im 12/158  mediastinal]
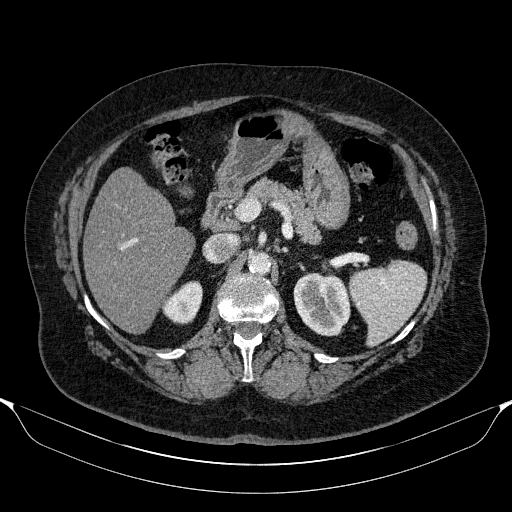
[im 12/158  lung]
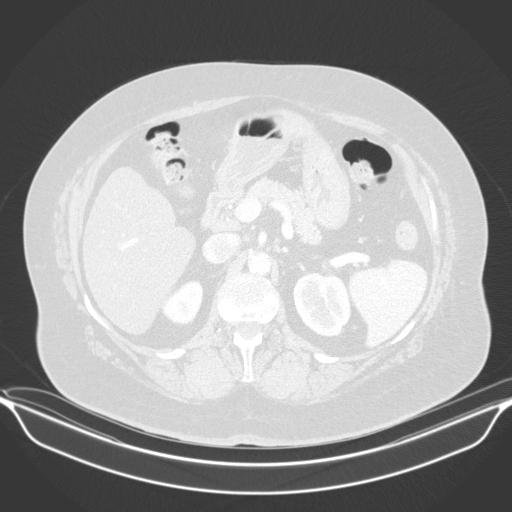
[im 24/158  lung]
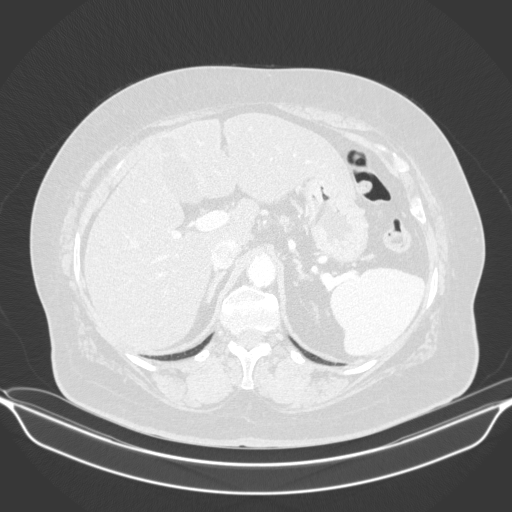
[im 32/158  lung]
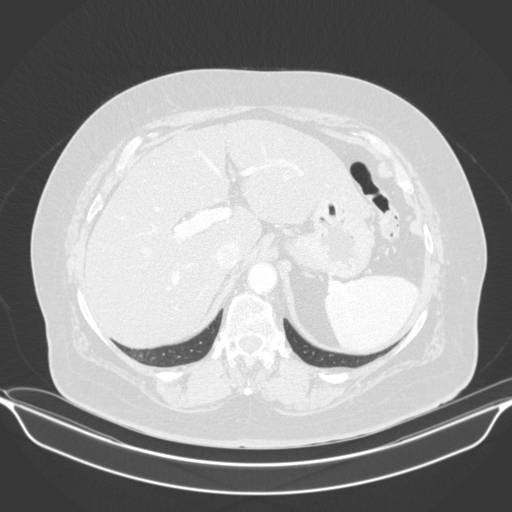
[im 41/158  lung]
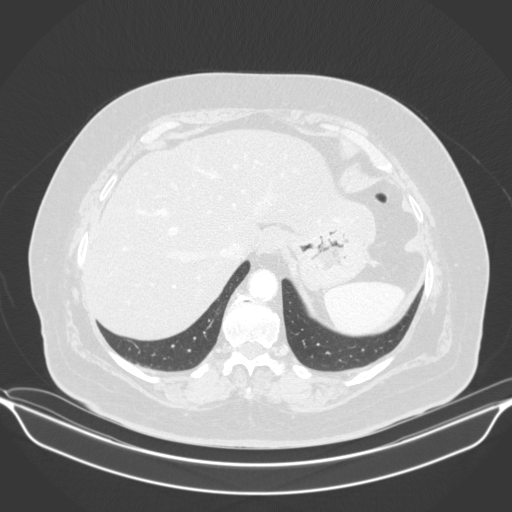
[im 53/158  mediastinal]
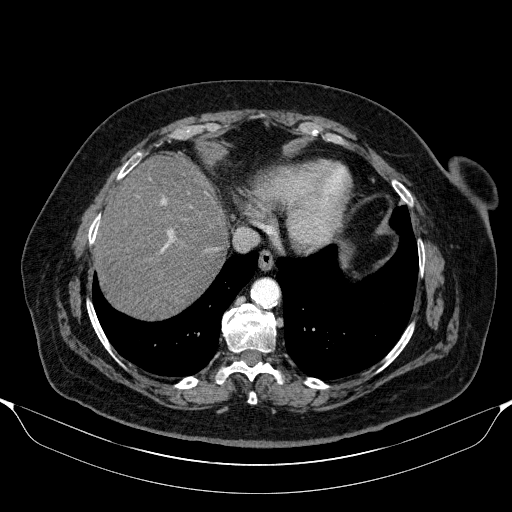
[im 53/158  lung]
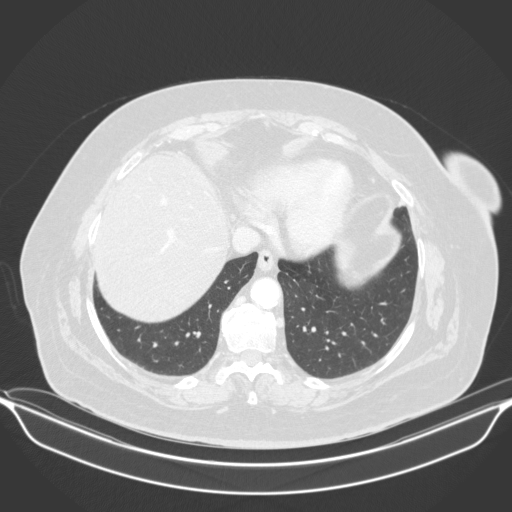
[im 63/158  lung]
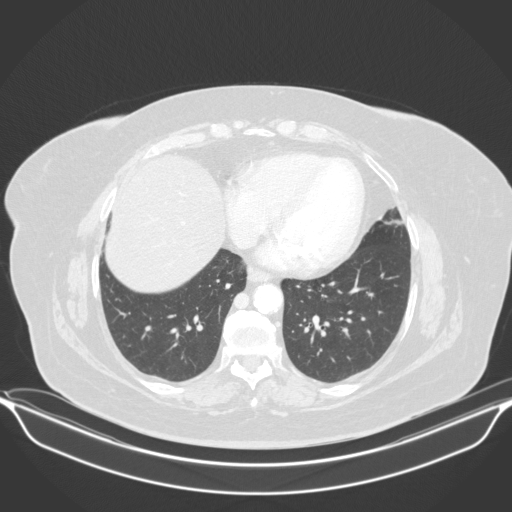
[im 70/158  lung]
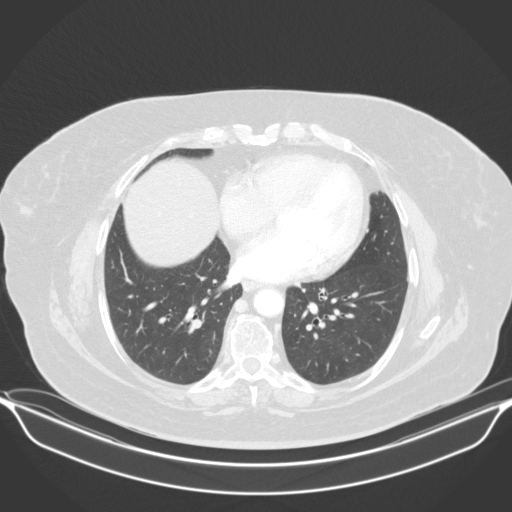
[im 82/158  lung]
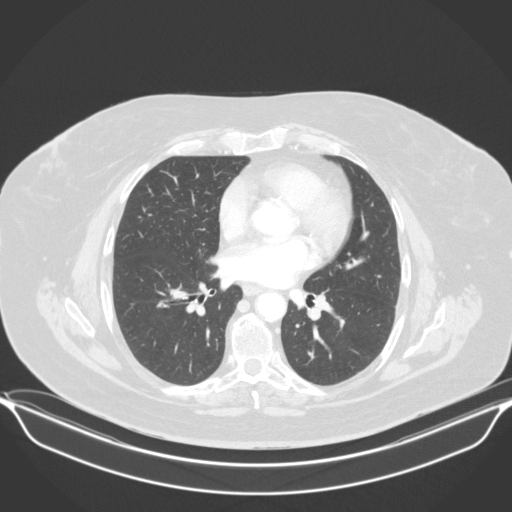
[im 88/158  mediastinal]
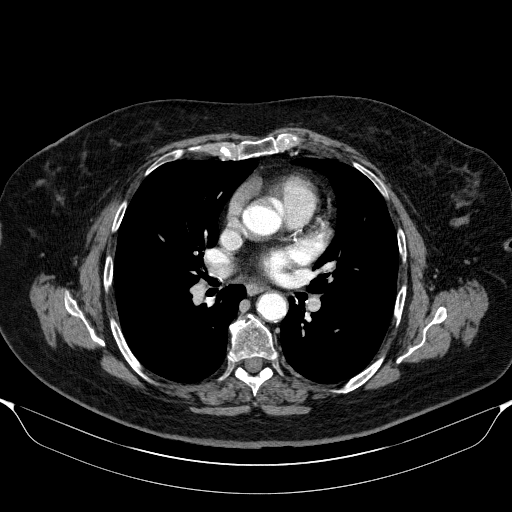
[im 88/158  lung]
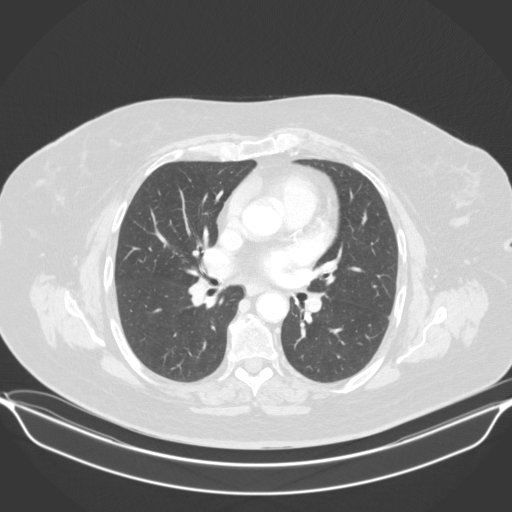
[im 95/158  lung]
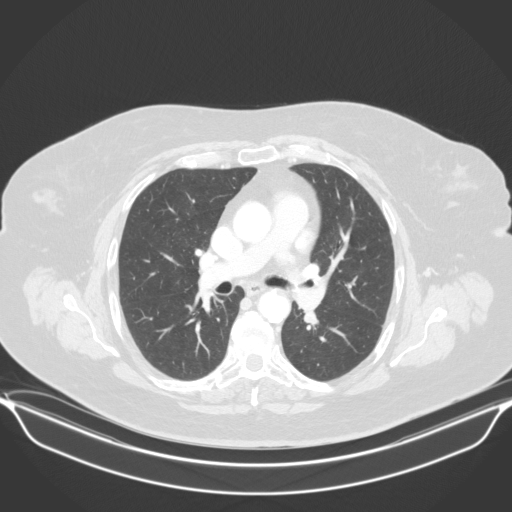
[im 105/158  lung]
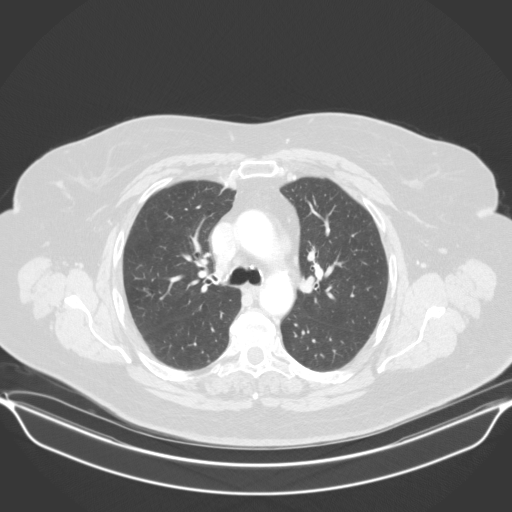
[im 117/158  lung]
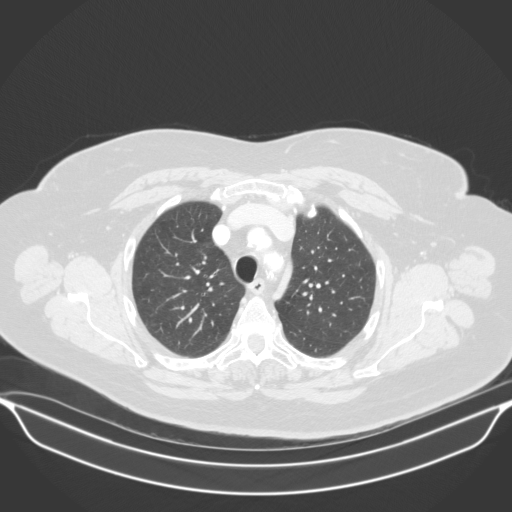
[im 126/158  mediastinal]
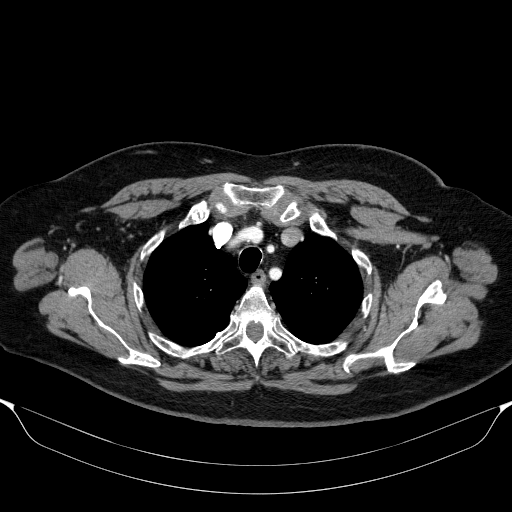
[im 126/158  lung]
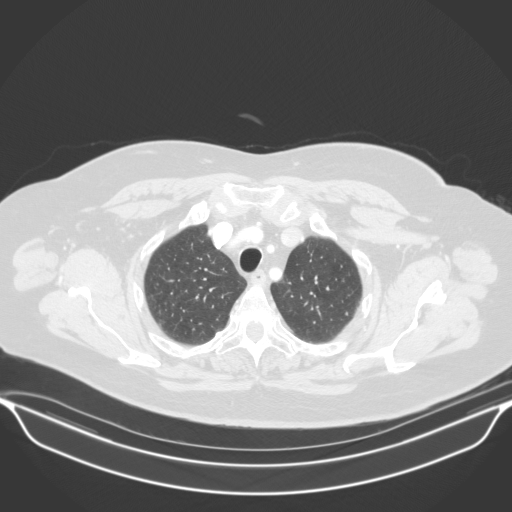
[im 134/158  lung]
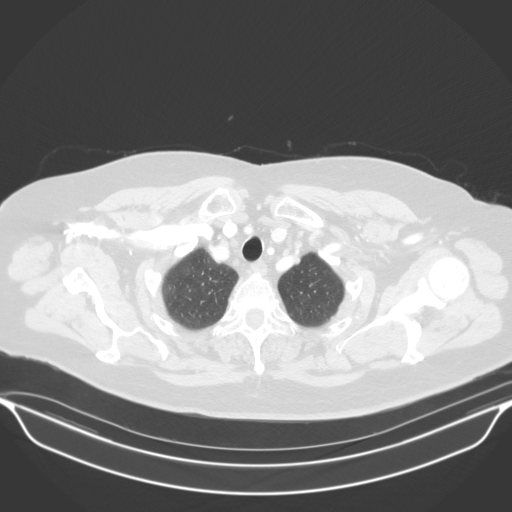
[im 146/158  lung]
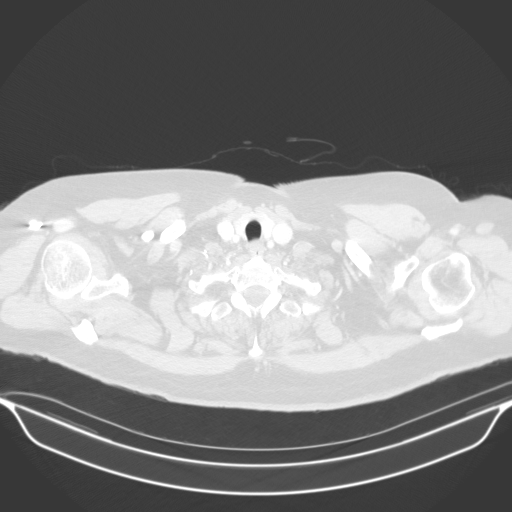

[15 of 34 positions shown; findings below may reference images not displayed]

FINDINGS: Cardiovascular: Moderate calcific and noncalcific atherosclerosis
throughout a nonaneurysmal thoracic aorta. Standard three-vessel
branching pattern. Mildly enlarged heart with mitral annular
calcification and aortic valvular calcification, also with signs of
coronary artery disease. No pericardial effusion. Central pulmonary
arteries are unremarkable.

Mediastinum/Nodes: No enlarged mediastinal, hilar, or axillary lymph
nodes. Thyroid gland, trachea, and esophagus demonstrate no
significant findings.

Lungs/Pleura: Scattered centrilobular ground-glass nodules in the
upper chest, upper lobes bilaterally in the 3-4 mm range minimal
bronchial wall thickening. Some of these nodules were present on the
previous imaging study from 1330. No signs of dense consolidation.
No pleural effusion.

Upper Abdomen: No acute process in the upper abdomen. Marked hepatic
steatosis.

Musculoskeletal: No acute bone finding or destructive bone process.
Spinal degenerative changes.
IMPRESSION: 1. Scattered centrilobular ground-glass nodules in the upper lobes,
upper lobes bilaterally, and minimal bronchial wall thickening.
Correlation with smoking history and any risk factors for
hypersensitivity pneumonitis is suggested. Findings likely related
to inflammatory process, not associated with any signs of
interstitial lung disease. Optional follow-up could be performed
according to [HOSPITAL] guidelines at a 12 month interval or
based on symptoms.
2. Coronary artery and aortic atherosclerosis.
3. Marked hepatic steatosis. Correlate with any clinical or
laboratory evidence of liver disease.

Aortic Atherosclerosis ([id number]-[id number].N).

## 2021-01-21 DIAGNOSIS — B029 Zoster without complications: Secondary | ICD-10-CM | POA: Diagnosis not present

## 2021-01-22 DIAGNOSIS — K219 Gastro-esophageal reflux disease without esophagitis: Secondary | ICD-10-CM | POA: Diagnosis not present

## 2021-01-22 DIAGNOSIS — I2510 Atherosclerotic heart disease of native coronary artery without angina pectoris: Secondary | ICD-10-CM | POA: Diagnosis not present

## 2021-01-22 DIAGNOSIS — M1990 Unspecified osteoarthritis, unspecified site: Secondary | ICD-10-CM | POA: Diagnosis not present

## 2021-01-22 DIAGNOSIS — N1831 Chronic kidney disease, stage 3a: Secondary | ICD-10-CM | POA: Diagnosis not present

## 2021-01-22 DIAGNOSIS — D500 Iron deficiency anemia secondary to blood loss (chronic): Secondary | ICD-10-CM | POA: Diagnosis not present

## 2021-01-22 DIAGNOSIS — I10 Essential (primary) hypertension: Secondary | ICD-10-CM | POA: Diagnosis not present

## 2021-01-22 DIAGNOSIS — N182 Chronic kidney disease, stage 2 (mild): Secondary | ICD-10-CM | POA: Diagnosis not present

## 2021-01-22 DIAGNOSIS — J411 Mucopurulent chronic bronchitis: Secondary | ICD-10-CM | POA: Diagnosis not present

## 2021-01-22 DIAGNOSIS — E785 Hyperlipidemia, unspecified: Secondary | ICD-10-CM | POA: Diagnosis not present

## 2021-01-22 DIAGNOSIS — E039 Hypothyroidism, unspecified: Secondary | ICD-10-CM | POA: Diagnosis not present

## 2021-01-31 DIAGNOSIS — B029 Zoster without complications: Secondary | ICD-10-CM | POA: Diagnosis not present

## 2021-01-31 DIAGNOSIS — I2510 Atherosclerotic heart disease of native coronary artery without angina pectoris: Secondary | ICD-10-CM | POA: Diagnosis not present

## 2021-01-31 DIAGNOSIS — Z7189 Other specified counseling: Secondary | ICD-10-CM | POA: Diagnosis not present

## 2021-01-31 DIAGNOSIS — N1831 Chronic kidney disease, stage 3a: Secondary | ICD-10-CM | POA: Diagnosis not present

## 2021-01-31 DIAGNOSIS — E785 Hyperlipidemia, unspecified: Secondary | ICD-10-CM | POA: Diagnosis not present

## 2021-01-31 DIAGNOSIS — J301 Allergic rhinitis due to pollen: Secondary | ICD-10-CM | POA: Diagnosis not present

## 2021-01-31 DIAGNOSIS — Z0000 Encounter for general adult medical examination without abnormal findings: Secondary | ICD-10-CM | POA: Diagnosis not present

## 2021-01-31 DIAGNOSIS — M069 Rheumatoid arthritis, unspecified: Secondary | ICD-10-CM | POA: Diagnosis not present

## 2021-01-31 DIAGNOSIS — I10 Essential (primary) hypertension: Secondary | ICD-10-CM | POA: Diagnosis not present

## 2021-01-31 DIAGNOSIS — K219 Gastro-esophageal reflux disease without esophagitis: Secondary | ICD-10-CM | POA: Diagnosis not present

## 2021-01-31 DIAGNOSIS — J4541 Moderate persistent asthma with (acute) exacerbation: Secondary | ICD-10-CM | POA: Diagnosis not present

## 2021-02-17 DIAGNOSIS — E785 Hyperlipidemia, unspecified: Secondary | ICD-10-CM | POA: Diagnosis not present

## 2021-02-17 DIAGNOSIS — B029 Zoster without complications: Secondary | ICD-10-CM | POA: Diagnosis not present

## 2021-02-27 DIAGNOSIS — Z79899 Other long term (current) drug therapy: Secondary | ICD-10-CM | POA: Diagnosis not present

## 2021-02-27 DIAGNOSIS — M0609 Rheumatoid arthritis without rheumatoid factor, multiple sites: Secondary | ICD-10-CM | POA: Diagnosis not present

## 2021-02-27 DIAGNOSIS — M2550 Pain in unspecified joint: Secondary | ICD-10-CM | POA: Diagnosis not present

## 2021-02-27 DIAGNOSIS — E790 Hyperuricemia without signs of inflammatory arthritis and tophaceous disease: Secondary | ICD-10-CM | POA: Diagnosis not present

## 2021-02-27 DIAGNOSIS — M150 Primary generalized (osteo)arthritis: Secondary | ICD-10-CM | POA: Diagnosis not present

## 2021-04-21 DIAGNOSIS — E039 Hypothyroidism, unspecified: Secondary | ICD-10-CM | POA: Diagnosis not present

## 2021-04-21 DIAGNOSIS — E785 Hyperlipidemia, unspecified: Secondary | ICD-10-CM | POA: Diagnosis not present

## 2021-04-21 DIAGNOSIS — M069 Rheumatoid arthritis, unspecified: Secondary | ICD-10-CM | POA: Diagnosis not present

## 2021-04-21 DIAGNOSIS — K219 Gastro-esophageal reflux disease without esophagitis: Secondary | ICD-10-CM | POA: Diagnosis not present

## 2021-04-21 DIAGNOSIS — D500 Iron deficiency anemia secondary to blood loss (chronic): Secondary | ICD-10-CM | POA: Diagnosis not present

## 2021-04-21 DIAGNOSIS — I2510 Atherosclerotic heart disease of native coronary artery without angina pectoris: Secondary | ICD-10-CM | POA: Diagnosis not present

## 2021-04-21 DIAGNOSIS — J42 Unspecified chronic bronchitis: Secondary | ICD-10-CM | POA: Diagnosis not present

## 2021-04-21 DIAGNOSIS — I10 Essential (primary) hypertension: Secondary | ICD-10-CM | POA: Diagnosis not present

## 2021-04-21 DIAGNOSIS — J4541 Moderate persistent asthma with (acute) exacerbation: Secondary | ICD-10-CM | POA: Diagnosis not present

## 2021-04-21 DIAGNOSIS — J411 Mucopurulent chronic bronchitis: Secondary | ICD-10-CM | POA: Diagnosis not present

## 2021-04-21 DIAGNOSIS — N1831 Chronic kidney disease, stage 3a: Secondary | ICD-10-CM | POA: Diagnosis not present

## 2021-05-21 DIAGNOSIS — Z79899 Other long term (current) drug therapy: Secondary | ICD-10-CM | POA: Diagnosis not present

## 2021-07-03 DIAGNOSIS — M0609 Rheumatoid arthritis without rheumatoid factor, multiple sites: Secondary | ICD-10-CM | POA: Diagnosis not present

## 2021-07-03 DIAGNOSIS — E790 Hyperuricemia without signs of inflammatory arthritis and tophaceous disease: Secondary | ICD-10-CM | POA: Diagnosis not present

## 2021-07-03 DIAGNOSIS — M150 Primary generalized (osteo)arthritis: Secondary | ICD-10-CM | POA: Diagnosis not present

## 2021-07-03 DIAGNOSIS — M2550 Pain in unspecified joint: Secondary | ICD-10-CM | POA: Diagnosis not present

## 2021-07-03 DIAGNOSIS — Z79899 Other long term (current) drug therapy: Secondary | ICD-10-CM | POA: Diagnosis not present

## 2021-07-04 DIAGNOSIS — N1831 Chronic kidney disease, stage 3a: Secondary | ICD-10-CM | POA: Diagnosis not present

## 2021-07-04 DIAGNOSIS — I2510 Atherosclerotic heart disease of native coronary artery without angina pectoris: Secondary | ICD-10-CM | POA: Diagnosis not present

## 2021-07-04 DIAGNOSIS — J411 Mucopurulent chronic bronchitis: Secondary | ICD-10-CM | POA: Diagnosis not present

## 2021-07-04 DIAGNOSIS — E039 Hypothyroidism, unspecified: Secondary | ICD-10-CM | POA: Diagnosis not present

## 2021-07-04 DIAGNOSIS — I10 Essential (primary) hypertension: Secondary | ICD-10-CM | POA: Diagnosis not present

## 2021-07-04 DIAGNOSIS — E785 Hyperlipidemia, unspecified: Secondary | ICD-10-CM | POA: Diagnosis not present

## 2021-07-04 DIAGNOSIS — K219 Gastro-esophageal reflux disease without esophagitis: Secondary | ICD-10-CM | POA: Diagnosis not present

## 2021-07-04 DIAGNOSIS — D500 Iron deficiency anemia secondary to blood loss (chronic): Secondary | ICD-10-CM | POA: Diagnosis not present

## 2021-07-04 DIAGNOSIS — M069 Rheumatoid arthritis, unspecified: Secondary | ICD-10-CM | POA: Diagnosis not present

## 2021-07-04 DIAGNOSIS — J4541 Moderate persistent asthma with (acute) exacerbation: Secondary | ICD-10-CM | POA: Diagnosis not present

## 2021-07-04 DIAGNOSIS — J42 Unspecified chronic bronchitis: Secondary | ICD-10-CM | POA: Diagnosis not present

## 2021-07-16 ENCOUNTER — Other Ambulatory Visit: Payer: Self-pay | Admitting: Internal Medicine

## 2021-07-16 DIAGNOSIS — Z1231 Encounter for screening mammogram for malignant neoplasm of breast: Secondary | ICD-10-CM

## 2021-07-18 ENCOUNTER — Ambulatory Visit
Admission: RE | Admit: 2021-07-18 | Discharge: 2021-07-18 | Disposition: A | Discharge status: Home / Self Care | Payer: Medicare Other | Source: Ambulatory Visit | Attending: Internal Medicine | Admitting: Internal Medicine

## 2021-07-18 ENCOUNTER — Other Ambulatory Visit: Payer: Self-pay

## 2021-07-18 DIAGNOSIS — Z1231 Encounter for screening mammogram for malignant neoplasm of breast: Secondary | ICD-10-CM

## 2021-08-08 DIAGNOSIS — E039 Hypothyroidism, unspecified: Secondary | ICD-10-CM | POA: Diagnosis not present

## 2021-08-08 DIAGNOSIS — K219 Gastro-esophageal reflux disease without esophagitis: Secondary | ICD-10-CM | POA: Diagnosis not present

## 2021-08-08 DIAGNOSIS — I2510 Atherosclerotic heart disease of native coronary artery without angina pectoris: Secondary | ICD-10-CM | POA: Diagnosis not present

## 2021-08-08 DIAGNOSIS — N1831 Chronic kidney disease, stage 3a: Secondary | ICD-10-CM | POA: Diagnosis not present

## 2021-08-08 DIAGNOSIS — M8580 Other specified disorders of bone density and structure, unspecified site: Secondary | ICD-10-CM | POA: Diagnosis not present

## 2021-08-08 DIAGNOSIS — I10 Essential (primary) hypertension: Secondary | ICD-10-CM | POA: Diagnosis not present

## 2021-08-08 DIAGNOSIS — Z0000 Encounter for general adult medical examination without abnormal findings: Secondary | ICD-10-CM | POA: Diagnosis not present

## 2021-08-08 DIAGNOSIS — N182 Chronic kidney disease, stage 2 (mild): Secondary | ICD-10-CM | POA: Diagnosis not present

## 2021-08-08 DIAGNOSIS — J4541 Moderate persistent asthma with (acute) exacerbation: Secondary | ICD-10-CM | POA: Diagnosis not present

## 2021-08-08 DIAGNOSIS — M069 Rheumatoid arthritis, unspecified: Secondary | ICD-10-CM | POA: Diagnosis not present

## 2021-08-12 ENCOUNTER — Other Ambulatory Visit: Payer: Self-pay | Admitting: Internal Medicine

## 2021-08-12 DIAGNOSIS — M8580 Other specified disorders of bone density and structure, unspecified site: Secondary | ICD-10-CM

## 2021-08-14 ENCOUNTER — Other Ambulatory Visit: Payer: Self-pay | Admitting: Internal Medicine

## 2021-08-14 DIAGNOSIS — K439 Ventral hernia without obstruction or gangrene: Secondary | ICD-10-CM

## 2021-08-15 ENCOUNTER — Other Ambulatory Visit: Payer: Self-pay

## 2021-08-15 ENCOUNTER — Ambulatory Visit
Admission: RE | Admit: 2021-08-15 | Discharge: 2021-08-15 | Disposition: A | Discharge status: Home / Self Care | Payer: Medicare Other | Source: Ambulatory Visit | Attending: Internal Medicine | Admitting: Internal Medicine

## 2021-08-15 DIAGNOSIS — I2510 Atherosclerotic heart disease of native coronary artery without angina pectoris: Secondary | ICD-10-CM | POA: Diagnosis not present

## 2021-08-15 DIAGNOSIS — K439 Ventral hernia without obstruction or gangrene: Secondary | ICD-10-CM

## 2021-08-15 DIAGNOSIS — M47816 Spondylosis without myelopathy or radiculopathy, lumbar region: Secondary | ICD-10-CM | POA: Diagnosis not present

## 2021-08-15 DIAGNOSIS — K5790 Diverticulosis of intestine, part unspecified, without perforation or abscess without bleeding: Secondary | ICD-10-CM | POA: Diagnosis not present

## 2021-08-15 DIAGNOSIS — I700 Atherosclerosis of aorta: Secondary | ICD-10-CM | POA: Diagnosis not present

## 2021-08-15 MED ORDER — IOPAMIDOL (ISOVUE-300) INJECTION 61%
100.0000 mL | Freq: Once | INTRAVENOUS | Status: AC | PRN
  Administered 2021-08-15: 100 mL via INTRAVENOUS

## 2021-09-02 DIAGNOSIS — J0110 Acute frontal sinusitis, unspecified: Secondary | ICD-10-CM | POA: Diagnosis not present

## 2021-09-02 DIAGNOSIS — R051 Acute cough: Secondary | ICD-10-CM | POA: Diagnosis not present

## 2021-09-02 DIAGNOSIS — J069 Acute upper respiratory infection, unspecified: Secondary | ICD-10-CM | POA: Diagnosis not present

## 2021-09-02 DIAGNOSIS — U071 COVID-19: Secondary | ICD-10-CM | POA: Diagnosis not present

## 2021-09-10 DIAGNOSIS — J301 Allergic rhinitis due to pollen: Secondary | ICD-10-CM | POA: Diagnosis not present

## 2021-09-10 DIAGNOSIS — R052 Subacute cough: Secondary | ICD-10-CM | POA: Diagnosis not present

## 2021-09-10 DIAGNOSIS — U071 COVID-19: Secondary | ICD-10-CM | POA: Diagnosis not present

## 2021-10-01 DIAGNOSIS — M0609 Rheumatoid arthritis without rheumatoid factor, multiple sites: Secondary | ICD-10-CM | POA: Diagnosis not present

## 2021-10-08 DIAGNOSIS — I10 Essential (primary) hypertension: Secondary | ICD-10-CM | POA: Diagnosis not present

## 2021-10-08 DIAGNOSIS — M1990 Unspecified osteoarthritis, unspecified site: Secondary | ICD-10-CM | POA: Diagnosis not present

## 2021-10-08 DIAGNOSIS — R350 Frequency of micturition: Secondary | ICD-10-CM | POA: Diagnosis not present

## 2021-10-09 DIAGNOSIS — R350 Frequency of micturition: Secondary | ICD-10-CM | POA: Diagnosis not present

## 2022-01-05 DIAGNOSIS — M0609 Rheumatoid arthritis without rheumatoid factor, multiple sites: Secondary | ICD-10-CM | POA: Diagnosis not present

## 2022-01-05 DIAGNOSIS — M2550 Pain in unspecified joint: Secondary | ICD-10-CM | POA: Diagnosis not present

## 2022-01-05 DIAGNOSIS — M150 Primary generalized (osteo)arthritis: Secondary | ICD-10-CM | POA: Diagnosis not present

## 2022-01-05 DIAGNOSIS — E790 Hyperuricemia without signs of inflammatory arthritis and tophaceous disease: Secondary | ICD-10-CM | POA: Diagnosis not present

## 2022-01-05 DIAGNOSIS — M545 Low back pain, unspecified: Secondary | ICD-10-CM | POA: Diagnosis not present

## 2022-01-05 DIAGNOSIS — Z79899 Other long term (current) drug therapy: Secondary | ICD-10-CM | POA: Diagnosis not present

## 2022-02-04 ENCOUNTER — Ambulatory Visit
Admission: RE | Admit: 2022-02-04 | Discharge: 2022-02-04 | Disposition: A | Discharge status: Home / Self Care | Payer: Medicare Other | Source: Ambulatory Visit | Attending: Internal Medicine | Admitting: Internal Medicine

## 2022-02-04 DIAGNOSIS — Z780 Asymptomatic menopausal state: Secondary | ICD-10-CM | POA: Diagnosis not present

## 2022-02-04 DIAGNOSIS — M8589 Other specified disorders of bone density and structure, multiple sites: Secondary | ICD-10-CM | POA: Diagnosis not present

## 2022-02-04 DIAGNOSIS — M8580 Other specified disorders of bone density and structure, unspecified site: Secondary | ICD-10-CM

## 2022-04-06 DIAGNOSIS — D509 Iron deficiency anemia, unspecified: Secondary | ICD-10-CM | POA: Diagnosis not present

## 2022-04-06 DIAGNOSIS — M0609 Rheumatoid arthritis without rheumatoid factor, multiple sites: Secondary | ICD-10-CM | POA: Diagnosis not present

## 2022-04-07 ENCOUNTER — Other Ambulatory Visit: Payer: Self-pay | Admitting: Internal Medicine

## 2022-04-07 ENCOUNTER — Ambulatory Visit
Admission: RE | Admit: 2022-04-07 | Discharge: 2022-04-07 | Disposition: A | Discharge status: Home / Self Care | Payer: Medicare Other | Source: Ambulatory Visit | Attending: Internal Medicine | Admitting: Internal Medicine

## 2022-04-07 DIAGNOSIS — M25562 Pain in left knee: Secondary | ICD-10-CM

## 2022-04-07 DIAGNOSIS — M1990 Unspecified osteoarthritis, unspecified site: Secondary | ICD-10-CM | POA: Diagnosis not present

## 2022-04-07 DIAGNOSIS — I10 Essential (primary) hypertension: Secondary | ICD-10-CM | POA: Diagnosis not present

## 2022-04-07 DIAGNOSIS — M25469 Effusion, unspecified knee: Secondary | ICD-10-CM | POA: Diagnosis not present

## 2022-04-07 DIAGNOSIS — R7989 Other specified abnormal findings of blood chemistry: Secondary | ICD-10-CM

## 2022-04-07 DIAGNOSIS — M7989 Other specified soft tissue disorders: Secondary | ICD-10-CM

## 2022-04-08 ENCOUNTER — Ambulatory Visit
Admission: RE | Admit: 2022-04-08 | Discharge: 2022-04-08 | Disposition: A | Discharge status: Home / Self Care | Payer: Medicare Other | Source: Ambulatory Visit | Attending: Internal Medicine | Admitting: Internal Medicine

## 2022-04-08 DIAGNOSIS — M7989 Other specified soft tissue disorders: Secondary | ICD-10-CM

## 2022-04-08 DIAGNOSIS — R7989 Other specified abnormal findings of blood chemistry: Secondary | ICD-10-CM

## 2022-04-27 DIAGNOSIS — M1990 Unspecified osteoarthritis, unspecified site: Secondary | ICD-10-CM | POA: Diagnosis not present

## 2022-04-27 DIAGNOSIS — Z0000 Encounter for general adult medical examination without abnormal findings: Secondary | ICD-10-CM | POA: Diagnosis not present

## 2022-04-27 DIAGNOSIS — I10 Essential (primary) hypertension: Secondary | ICD-10-CM | POA: Diagnosis not present

## 2022-04-27 DIAGNOSIS — I700 Atherosclerosis of aorta: Secondary | ICD-10-CM | POA: Diagnosis not present

## 2022-04-27 DIAGNOSIS — N182 Chronic kidney disease, stage 2 (mild): Secondary | ICD-10-CM | POA: Diagnosis not present

## 2022-04-27 DIAGNOSIS — R1030 Lower abdominal pain, unspecified: Secondary | ICD-10-CM | POA: Diagnosis not present

## 2022-04-27 DIAGNOSIS — E039 Hypothyroidism, unspecified: Secondary | ICD-10-CM | POA: Diagnosis not present

## 2022-05-04 DIAGNOSIS — Z1211 Encounter for screening for malignant neoplasm of colon: Secondary | ICD-10-CM | POA: Diagnosis not present

## 2022-06-03 DIAGNOSIS — N289 Disorder of kidney and ureter, unspecified: Secondary | ICD-10-CM | POA: Diagnosis not present

## 2022-06-03 DIAGNOSIS — I10 Essential (primary) hypertension: Secondary | ICD-10-CM | POA: Diagnosis not present

## 2022-06-03 DIAGNOSIS — R296 Repeated falls: Secondary | ICD-10-CM | POA: Diagnosis not present

## 2022-06-16 DIAGNOSIS — R1032 Left lower quadrant pain: Secondary | ICD-10-CM | POA: Diagnosis not present

## 2022-06-16 DIAGNOSIS — D649 Anemia, unspecified: Secondary | ICD-10-CM | POA: Diagnosis not present

## 2022-06-29 DIAGNOSIS — Z79899 Other long term (current) drug therapy: Secondary | ICD-10-CM | POA: Diagnosis not present

## 2022-07-08 DIAGNOSIS — M0609 Rheumatoid arthritis without rheumatoid factor, multiple sites: Secondary | ICD-10-CM | POA: Diagnosis not present

## 2022-07-08 DIAGNOSIS — D649 Anemia, unspecified: Secondary | ICD-10-CM | POA: Diagnosis not present

## 2022-07-08 DIAGNOSIS — M150 Primary generalized (osteo)arthritis: Secondary | ICD-10-CM | POA: Diagnosis not present

## 2022-07-08 DIAGNOSIS — Z79899 Other long term (current) drug therapy: Secondary | ICD-10-CM | POA: Diagnosis not present

## 2022-07-08 DIAGNOSIS — R1030 Lower abdominal pain, unspecified: Secondary | ICD-10-CM | POA: Diagnosis not present

## 2022-07-08 DIAGNOSIS — E790 Hyperuricemia without signs of inflammatory arthritis and tophaceous disease: Secondary | ICD-10-CM | POA: Diagnosis not present

## 2022-07-08 DIAGNOSIS — M2550 Pain in unspecified joint: Secondary | ICD-10-CM | POA: Diagnosis not present

## 2022-07-08 DIAGNOSIS — I2510 Atherosclerotic heart disease of native coronary artery without angina pectoris: Secondary | ICD-10-CM | POA: Diagnosis not present

## 2022-07-08 DIAGNOSIS — M545 Low back pain, unspecified: Secondary | ICD-10-CM | POA: Diagnosis not present

## 2022-07-08 DIAGNOSIS — I10 Essential (primary) hypertension: Secondary | ICD-10-CM | POA: Diagnosis not present

## 2022-07-17 DIAGNOSIS — Z79899 Other long term (current) drug therapy: Secondary | ICD-10-CM | POA: Diagnosis not present

## 2022-07-24 ENCOUNTER — Other Ambulatory Visit: Payer: Self-pay | Admitting: Internal Medicine

## 2022-07-24 DIAGNOSIS — Z1231 Encounter for screening mammogram for malignant neoplasm of breast: Secondary | ICD-10-CM

## 2022-07-30 DIAGNOSIS — R1030 Lower abdominal pain, unspecified: Secondary | ICD-10-CM | POA: Diagnosis not present

## 2022-08-04 ENCOUNTER — Ambulatory Visit
Admission: RE | Admit: 2022-08-04 | Discharge: 2022-08-04 | Disposition: A | Discharge status: Home / Self Care | Payer: Medicare Other | Source: Ambulatory Visit | Attending: Internal Medicine | Admitting: Internal Medicine

## 2022-08-04 DIAGNOSIS — Z1231 Encounter for screening mammogram for malignant neoplasm of breast: Secondary | ICD-10-CM | POA: Diagnosis not present

## 2022-08-10 DIAGNOSIS — M0609 Rheumatoid arthritis without rheumatoid factor, multiple sites: Secondary | ICD-10-CM | POA: Diagnosis not present

## 2022-08-10 DIAGNOSIS — Z79899 Other long term (current) drug therapy: Secondary | ICD-10-CM | POA: Diagnosis not present

## 2022-09-14 DIAGNOSIS — L817 Pigmented purpuric dermatosis: Secondary | ICD-10-CM | POA: Diagnosis not present

## 2022-09-14 DIAGNOSIS — L570 Actinic keratosis: Secondary | ICD-10-CM | POA: Diagnosis not present

## 2022-09-14 DIAGNOSIS — D044 Carcinoma in situ of skin of scalp and neck: Secondary | ICD-10-CM | POA: Diagnosis not present

## 2022-09-14 DIAGNOSIS — Q828 Other specified congenital malformations of skin: Secondary | ICD-10-CM | POA: Diagnosis not present

## 2022-09-14 DIAGNOSIS — L814 Other melanin hyperpigmentation: Secondary | ICD-10-CM | POA: Diagnosis not present

## 2022-09-14 DIAGNOSIS — D485 Neoplasm of uncertain behavior of skin: Secondary | ICD-10-CM | POA: Diagnosis not present

## 2022-10-05 DIAGNOSIS — E039 Hypothyroidism, unspecified: Secondary | ICD-10-CM | POA: Diagnosis not present

## 2022-10-05 DIAGNOSIS — I10 Essential (primary) hypertension: Secondary | ICD-10-CM | POA: Diagnosis not present

## 2022-10-05 DIAGNOSIS — M6530 Trigger finger, unspecified finger: Secondary | ICD-10-CM | POA: Diagnosis not present

## 2022-10-08 DIAGNOSIS — Z79899 Other long term (current) drug therapy: Secondary | ICD-10-CM | POA: Diagnosis not present

## 2022-10-08 DIAGNOSIS — M0609 Rheumatoid arthritis without rheumatoid factor, multiple sites: Secondary | ICD-10-CM | POA: Diagnosis not present

## 2022-10-16 DIAGNOSIS — M65331 Trigger finger, right middle finger: Secondary | ICD-10-CM | POA: Diagnosis not present

## 2022-10-16 DIAGNOSIS — M65332 Trigger finger, left middle finger: Secondary | ICD-10-CM | POA: Diagnosis not present

## 2022-10-26 DIAGNOSIS — D044 Carcinoma in situ of skin of scalp and neck: Secondary | ICD-10-CM | POA: Diagnosis not present

## 2022-10-28 DIAGNOSIS — E039 Hypothyroidism, unspecified: Secondary | ICD-10-CM | POA: Diagnosis not present

## 2022-10-28 DIAGNOSIS — M6530 Trigger finger, unspecified finger: Secondary | ICD-10-CM | POA: Diagnosis not present

## 2022-10-28 DIAGNOSIS — I10 Essential (primary) hypertension: Secondary | ICD-10-CM | POA: Diagnosis not present

## 2022-10-28 DIAGNOSIS — M1990 Unspecified osteoarthritis, unspecified site: Secondary | ICD-10-CM | POA: Diagnosis not present

## 2023-01-07 DIAGNOSIS — M0609 Rheumatoid arthritis without rheumatoid factor, multiple sites: Secondary | ICD-10-CM | POA: Diagnosis not present

## 2023-01-07 DIAGNOSIS — Z79899 Other long term (current) drug therapy: Secondary | ICD-10-CM | POA: Diagnosis not present

## 2023-01-07 DIAGNOSIS — M1991 Primary osteoarthritis, unspecified site: Secondary | ICD-10-CM | POA: Diagnosis not present

## 2023-01-07 DIAGNOSIS — E790 Hyperuricemia without signs of inflammatory arthritis and tophaceous disease: Secondary | ICD-10-CM | POA: Diagnosis not present

## 2023-02-26 DIAGNOSIS — N1831 Chronic kidney disease, stage 3a: Secondary | ICD-10-CM | POA: Diagnosis not present

## 2023-02-26 DIAGNOSIS — Z03818 Encounter for observation for suspected exposure to other biological agents ruled out: Secondary | ICD-10-CM | POA: Diagnosis not present

## 2023-02-26 DIAGNOSIS — D849 Immunodeficiency, unspecified: Secondary | ICD-10-CM | POA: Diagnosis not present

## 2023-02-26 DIAGNOSIS — R059 Cough, unspecified: Secondary | ICD-10-CM | POA: Diagnosis not present

## 2023-02-26 DIAGNOSIS — B349 Viral infection, unspecified: Secondary | ICD-10-CM | POA: Diagnosis not present

## 2023-02-26 DIAGNOSIS — M069 Rheumatoid arthritis, unspecified: Secondary | ICD-10-CM | POA: Diagnosis not present

## 2023-02-26 DIAGNOSIS — J209 Acute bronchitis, unspecified: Secondary | ICD-10-CM | POA: Diagnosis not present

## 2023-02-26 DIAGNOSIS — I700 Atherosclerosis of aorta: Secondary | ICD-10-CM | POA: Diagnosis not present

## 2023-03-10 ENCOUNTER — Other Ambulatory Visit: Payer: Self-pay | Admitting: Internal Medicine

## 2023-03-10 ENCOUNTER — Ambulatory Visit
Admission: RE | Admit: 2023-03-10 | Discharge: 2023-03-10 | Disposition: A | Discharge status: Home / Self Care | Payer: Medicare Other | Source: Ambulatory Visit | Attending: Internal Medicine | Admitting: Internal Medicine

## 2023-03-10 DIAGNOSIS — J411 Mucopurulent chronic bronchitis: Secondary | ICD-10-CM

## 2023-03-10 DIAGNOSIS — D849 Immunodeficiency, unspecified: Secondary | ICD-10-CM | POA: Diagnosis not present

## 2023-03-10 DIAGNOSIS — R059 Cough, unspecified: Secondary | ICD-10-CM | POA: Diagnosis not present

## 2023-03-17 DIAGNOSIS — L814 Other melanin hyperpigmentation: Secondary | ICD-10-CM | POA: Diagnosis not present

## 2023-03-17 DIAGNOSIS — D1801 Hemangioma of skin and subcutaneous tissue: Secondary | ICD-10-CM | POA: Diagnosis not present

## 2023-03-17 DIAGNOSIS — D229 Melanocytic nevi, unspecified: Secondary | ICD-10-CM | POA: Diagnosis not present

## 2023-03-17 DIAGNOSIS — L821 Other seborrheic keratosis: Secondary | ICD-10-CM | POA: Diagnosis not present

## 2023-03-17 DIAGNOSIS — L570 Actinic keratosis: Secondary | ICD-10-CM | POA: Diagnosis not present

## 2023-03-17 DIAGNOSIS — L578 Other skin changes due to chronic exposure to nonionizing radiation: Secondary | ICD-10-CM | POA: Diagnosis not present

## 2023-04-08 DIAGNOSIS — Z79899 Other long term (current) drug therapy: Secondary | ICD-10-CM | POA: Diagnosis not present

## 2023-04-08 DIAGNOSIS — M0609 Rheumatoid arthritis without rheumatoid factor, multiple sites: Secondary | ICD-10-CM | POA: Diagnosis not present

## 2023-05-14 DIAGNOSIS — E039 Hypothyroidism, unspecified: Secondary | ICD-10-CM | POA: Diagnosis not present

## 2023-05-14 DIAGNOSIS — I700 Atherosclerosis of aorta: Secondary | ICD-10-CM | POA: Diagnosis not present

## 2023-05-14 DIAGNOSIS — Z23 Encounter for immunization: Secondary | ICD-10-CM | POA: Diagnosis not present

## 2023-05-14 DIAGNOSIS — I10 Essential (primary) hypertension: Secondary | ICD-10-CM | POA: Diagnosis not present

## 2023-05-14 DIAGNOSIS — M545 Low back pain, unspecified: Secondary | ICD-10-CM | POA: Diagnosis not present

## 2023-05-14 DIAGNOSIS — Z0000 Encounter for general adult medical examination without abnormal findings: Secondary | ICD-10-CM | POA: Diagnosis not present

## 2023-05-14 DIAGNOSIS — E785 Hyperlipidemia, unspecified: Secondary | ICD-10-CM | POA: Diagnosis not present

## 2023-05-14 DIAGNOSIS — D649 Anemia, unspecified: Secondary | ICD-10-CM | POA: Diagnosis not present

## 2023-05-14 DIAGNOSIS — I2510 Atherosclerotic heart disease of native coronary artery without angina pectoris: Secondary | ICD-10-CM | POA: Diagnosis not present

## 2023-06-14 DIAGNOSIS — E785 Hyperlipidemia, unspecified: Secondary | ICD-10-CM | POA: Diagnosis not present

## 2023-06-14 DIAGNOSIS — I700 Atherosclerosis of aorta: Secondary | ICD-10-CM | POA: Diagnosis not present

## 2023-06-14 DIAGNOSIS — M5136 Other intervertebral disc degeneration, lumbar region: Secondary | ICD-10-CM | POA: Diagnosis not present

## 2023-06-14 DIAGNOSIS — M5451 Vertebrogenic low back pain: Secondary | ICD-10-CM | POA: Diagnosis not present

## 2023-06-29 DIAGNOSIS — M545 Low back pain, unspecified: Secondary | ICD-10-CM | POA: Diagnosis not present

## 2023-07-14 DIAGNOSIS — Z79899 Other long term (current) drug therapy: Secondary | ICD-10-CM | POA: Diagnosis not present

## 2023-07-14 DIAGNOSIS — H524 Presbyopia: Secondary | ICD-10-CM | POA: Diagnosis not present

## 2023-07-15 DIAGNOSIS — M1991 Primary osteoarthritis, unspecified site: Secondary | ICD-10-CM | POA: Diagnosis not present

## 2023-07-15 DIAGNOSIS — M545 Low back pain, unspecified: Secondary | ICD-10-CM | POA: Diagnosis not present

## 2023-07-15 DIAGNOSIS — R202 Paresthesia of skin: Secondary | ICD-10-CM | POA: Diagnosis not present

## 2023-07-15 DIAGNOSIS — M0609 Rheumatoid arthritis without rheumatoid factor, multiple sites: Secondary | ICD-10-CM | POA: Diagnosis not present

## 2023-07-15 DIAGNOSIS — Z79899 Other long term (current) drug therapy: Secondary | ICD-10-CM | POA: Diagnosis not present

## 2023-07-19 DIAGNOSIS — M545 Low back pain, unspecified: Secondary | ICD-10-CM | POA: Diagnosis not present

## 2023-07-21 DIAGNOSIS — M545 Low back pain, unspecified: Secondary | ICD-10-CM | POA: Diagnosis not present

## 2023-07-26 DIAGNOSIS — M545 Low back pain, unspecified: Secondary | ICD-10-CM | POA: Diagnosis not present

## 2023-09-01 ENCOUNTER — Ambulatory Visit
Admission: RE | Admit: 2023-09-01 | Discharge: 2023-09-01 | Disposition: A | Discharge status: Home / Self Care | Payer: Medicare Other | Source: Ambulatory Visit | Attending: Internal Medicine | Admitting: Internal Medicine

## 2023-09-01 ENCOUNTER — Other Ambulatory Visit: Payer: Self-pay | Admitting: Internal Medicine

## 2023-09-01 DIAGNOSIS — Z0000 Encounter for general adult medical examination without abnormal findings: Secondary | ICD-10-CM

## 2023-09-01 DIAGNOSIS — Z1231 Encounter for screening mammogram for malignant neoplasm of breast: Secondary | ICD-10-CM | POA: Diagnosis not present

## 2023-09-03 ENCOUNTER — Other Ambulatory Visit: Payer: Self-pay | Admitting: Internal Medicine

## 2023-09-03 DIAGNOSIS — R928 Other abnormal and inconclusive findings on diagnostic imaging of breast: Secondary | ICD-10-CM

## 2023-09-07 ENCOUNTER — Ambulatory Visit: Payer: Medicare Other

## 2023-09-08 ENCOUNTER — Ambulatory Visit
Admission: RE | Admit: 2023-09-08 | Discharge: 2023-09-08 | Disposition: A | Discharge status: Home / Self Care | Payer: Medicare Other | Source: Ambulatory Visit | Attending: Internal Medicine | Admitting: Internal Medicine

## 2023-09-08 DIAGNOSIS — R921 Mammographic calcification found on diagnostic imaging of breast: Secondary | ICD-10-CM | POA: Diagnosis not present

## 2023-09-08 DIAGNOSIS — R928 Other abnormal and inconclusive findings on diagnostic imaging of breast: Secondary | ICD-10-CM

## 2023-09-09 ENCOUNTER — Other Ambulatory Visit: Payer: Self-pay | Admitting: Internal Medicine

## 2023-09-09 DIAGNOSIS — R921 Mammographic calcification found on diagnostic imaging of breast: Secondary | ICD-10-CM

## 2023-09-10 ENCOUNTER — Ambulatory Visit
Admission: RE | Admit: 2023-09-10 | Discharge: 2023-09-10 | Disposition: A | Discharge status: Home / Self Care | Payer: Medicare Other | Source: Ambulatory Visit | Attending: Internal Medicine | Admitting: Internal Medicine

## 2023-09-10 DIAGNOSIS — N6032 Fibrosclerosis of left breast: Secondary | ICD-10-CM | POA: Diagnosis not present

## 2023-09-10 DIAGNOSIS — R921 Mammographic calcification found on diagnostic imaging of breast: Secondary | ICD-10-CM | POA: Diagnosis not present

## 2023-09-10 HISTORY — PX: BREAST BIOPSY: SHX20

## 2023-09-13 LAB — SURGICAL PATHOLOGY

## 2023-09-29 DIAGNOSIS — R202 Paresthesia of skin: Secondary | ICD-10-CM | POA: Diagnosis not present

## 2023-09-29 DIAGNOSIS — L570 Actinic keratosis: Secondary | ICD-10-CM | POA: Diagnosis not present

## 2023-10-14 DIAGNOSIS — M0609 Rheumatoid arthritis without rheumatoid factor, multiple sites: Secondary | ICD-10-CM | POA: Diagnosis not present

## 2023-11-15 DIAGNOSIS — N1831 Chronic kidney disease, stage 3a: Secondary | ICD-10-CM | POA: Diagnosis not present

## 2023-11-15 DIAGNOSIS — Z7969 Long term (current) use of other immunomodulators and immunosuppressants: Secondary | ICD-10-CM | POA: Diagnosis not present

## 2023-11-15 DIAGNOSIS — D473 Essential (hemorrhagic) thrombocythemia: Secondary | ICD-10-CM | POA: Diagnosis not present

## 2023-11-15 DIAGNOSIS — D75839 Thrombocytosis, unspecified: Secondary | ICD-10-CM | POA: Diagnosis not present

## 2023-11-15 DIAGNOSIS — I10 Essential (primary) hypertension: Secondary | ICD-10-CM | POA: Diagnosis not present

## 2023-12-07 NOTE — Progress Notes (Unsigned)
[facility] [phone]   [phone]  INITIAL CONSULT NOTE  Patient Care Team: Lorenda Ishihara, MD as PCP - General (Internal Medicine)  Hematological/Oncological History # Thrombocytosis, Unclear Etiology  11/16/2023: WBC 7.2, Hgb 11.2, MCV 96.4, Plt 448 12/08/2023: establish care with Dr. Leonides Schanz   CHIEF COMPLAINTS/PURPOSE OF CONSULTATION:  " Thrombocytosis "  HISTORY OF PRESENTING ILLNESS:  Lucia Gaskins Allis 82 y.o. female with medical history significant for anxiety, RA, GERD, and HTN who presents for evaluation of thrombocytosis.   On review of the previous records Mrs. Belsito had labs collected on 11/16/2023 which showed hemoglobin 11.2, white blood cell 7.2, MCV 96.4, platelets 448.  Due to concern for her persistent thrombocytosis she was referred to hematology for further evaluation and management.  On exam today Mrs. Linker is accompanied by her husband.  She reports that last few times her blood has been checked and has been "not right".  She also notes that she suffers from arthritis osteoarthritis and rheumatoid arthritis.  She reports that she underwent a hip replacement 3 years ago and "lost a lot of blood".  She reports that she was on iron tablets for 3 months time but stopped about 4 months ago.  It was not causing any stomach upset but did cause dark stools.  She reports her energy levels have been low.  She reports that she still "goes but is not as fast as she used to be".  She has not been having any overt signs of bleeding, bruising, or dark stools.  She does not eat much red meat but does eat some occasional hamburger meat.  She reports that for her rheumatoid arthritis she is taking methotrexate and hydroxychloroquine.  On further discussion she reports that her mother had a myxedema and her father died of unclear causes.  She has a daughter with breast cancer and colon cancer.  Her maternal grandmother had breast cancer.   She has another daughter who is healthy.  She is a never smoker but does drink about 2 glasses of wine per day.  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  She otherwise denies any fevers, chills, sweats, nausea, vomiting or diarrhea.  A full 10 point ROS is otherwise negative.  MEDICAL HISTORY:  Past Medical History:  Diagnosis Date   Anxiety    pt. may have panicy feeling upon awaking from anesth. - states she feels anxious at times  & has used Paxil & xanax in the [past but its been a very long time ago    Arthritis    rheumatoid & OA-hands, shoulders    Dyspnea    GERD (gastroesophageal reflux disease)    Heart murmur    Hypertension     SURGICAL HISTORY: Past Surgical History:  Procedure Laterality Date   APPENDECTOMY     laproscopic   BREAST BIOPSY Left 09/10/2023   MM LT BREAST BX W LOC DEV 1ST LESION IMAGE BX SPEC STEREO GUIDE 09/10/2023 [facility]   BREAST BIOPSY Left 09/10/2023   MM LT BREAST BX W LOC DEV EA AD LESION IMG BX SPEC STEREO GUIDE 09/10/2023 [facility]   COLONOSCOPY     HEMORROIDECTOMY     INSERTION OF MESH N/A 09/12/2018   Procedure: INSERTION OF MESH;  Surgeon: Griselda Miner, MD;  Location: [facility] OR;  Service: General;  Laterality: N/A;   LAPAROSCOPIC APPENDECTOMY N/A 01/06/2017   Procedure: LAPAROSCOPIC APPENDECTOMY;  Surgeon: Chevis Pretty III, MD;  Location: [facility] OR;  Service: General;  Laterality: N/A;   TONSILLECTOMY     as a child   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   Procedure: HIP ARTHROPLASTY ANTERIOR APPROACH;  Surgeon: Cammy Copa, MD;  Location: [facility] OR;  Service: Orthopedics;  Laterality: Left;   VAGINAL DELIVERY     x2   VENTRAL HERNIA REPAIR N/A 09/12/2018   Procedure: LAPAROSCOPIC VENTRAL HERNIA REPAIR WITH MESH;  Surgeon: Griselda Miner, MD;  Location: [facility] OR;  Service: General;  Laterality: N/A;   VIDEO BRONCHOSCOPY Bilateral 11/07/2019   Procedure: VIDEO BRONCHOSCOPY WITH  FLUORO;  Surgeon: Coralyn Helling, MD;  Location: [facility] ENDOSCOPY;  Service: Endoscopy;  Laterality: Bilateral;    SOCIAL HISTORY: Social History   Socioeconomic History   Marital status: Married    Spouse name: Not on file   Number of children: Not on file   Years of education: Not on file   Highest education level: Not on file  Occupational History   Not on file  Tobacco Use   Smoking status: Never   Smokeless tobacco: Never  Substance and Sexual Activity   Alcohol use: Yes    Comment: daily- wine & bourbon & ginger    Drug use: No   Sexual activity: Not on file  Other Topics Concern   Not on file  Social History Narrative   Not on file   Social Determinants of Health   Financial Resource Strain: Not on file  Food Insecurity: Not on file  Transportation Needs: Not on file  Physical Activity: Not on file  Stress: Not on file  Social Connections: Not on file  Intimate Partner Violence: Not on file    FAMILY HISTORY: Family History  Problem Relation Age of Onset   Breast cancer Daughter    Breast cancer Maternal Grandfather     ALLERGIES:  is allergic to aleve [naproxen].  MEDICATIONS:  Current Outpatient Medications  Medication Sig Dispense Refill   acetaminophen (TYLENOL) 650 MG CR tablet Take 1,300 mg by mouth every 8 (eight) hours as needed for pain.     amLODipine (NORVASC) 5 MG tablet Take 5 mg by mouth daily.     Carboxymethylcellulose Sodium (REFRESH LIQUIGEL) 1 % GEL Place 1 drop into both eyes at bedtime.     Cholecalciferol (VITAMIN D) 2000 units CAPS Take 2,000 Units by mouth daily.      clobetasol cream (TEMOVATE) 0.05 % Apply 1 application topically 2 (two) times daily as needed (irritation).     diclofenac Sodium (VOLTAREN) 1 % GEL Apply 1 application topically 2 (two) times daily as needed (arthritis pain).     estradiol (ESTRACE) 0.1 MG/GM vaginal cream Place 1 Applicatorful vaginally daily as needed (for vaginal discomfort/dryness).      folic acid  (FOLVITE) 800 MCG tablet Take 800 mcg by mouth daily.      Glucosamine-Chondroitin (MOVE FREE PO) Take 1 capsule by mouth 2 (two) times daily.     hydroxychloroquine (PLAQUENIL) 200 MG tablet Take 200 mg by mouth 2 (two) times daily.     levothyroxine (SYNTHROID, LEVOTHROID) 25 MCG tablet Take 25 mcg by mouth daily before breakfast.      methotrexate (RHEUMATREX) 2.5 MG tablet Take 12.5 mg by mouth every Friday.      Multiple Minerals-Vitamins (CALCIUM-MAGNESIUM-ZINC-D3 PO) Take 1 tablet by mouth 2 (two) times daily.      Multiple Vitamin (MULTIVITAMIN WITH MINERALS) TABS tablet Take 1 tablet by mouth  daily.     olmesartan (BENICAR) 20 MG tablet Take 20 mg by mouth daily.      omeprazole (PRILOSEC) 40 MG capsule Take 40 mg by mouth daily.     PARoxetine (PAXIL) 20 MG tablet Take 20 mg by mouth daily.     polyethylene glycol (MIRALAX / GLYCOLAX) packet Take 17 g by mouth daily.      Polyvinyl Alcohol-Povidone PF (REFRESH) 1.4-0.6 % SOLN Place 1 drop into both eyes daily.     Probiotic Product (PROBIOTIC DAILY PO) Take 1 capsule by mouth daily.     simvastatin (ZOCOR) 40 MG tablet Take 40 mg by mouth at bedtime.      TURMERIC PO Take 2 capsules by mouth daily.      No current facility-administered medications for this visit.    REVIEW OF SYSTEMS:   Constitutional: ( - ) fevers, ( - )  chills , ( - ) night sweats Eyes: ( - ) blurriness of vision, ( - ) double vision, ( - ) watery eyes Ears, nose, mouth, throat, and face: ( - ) mucositis, ( - ) sore throat Respiratory: ( - ) cough, ( - ) dyspnea, ( - ) wheezes Cardiovascular: ( - ) palpitation, ( - ) chest discomfort, ( - ) lower extremity swelling Gastrointestinal:  ( - ) nausea, ( - ) heartburn, ( - ) change in bowel habits Skin: ( - ) abnormal skin rashes Lymphatics: ( - ) new lymphadenopathy, ( - ) easy bruising Neurological: ( - ) numbness, ( - ) tingling, ( - ) new weaknesses Behavioral/Psych: ( - ) mood change, ( - ) new changes  All  other systems were reviewed with the patient and are negative.  PHYSICAL EXAMINATION:  Vitals:   12/08/23 1340  BP: (!) 161/68  Pulse: 91  Resp: 16  Temp: 97.8 F (36.6 C)  SpO2: 96%   Filed Weights   12/08/23 1340  Weight: 180 lb 1.6 oz (81.7 kg)    GENERAL: well appearing elderly Caucasian female in NAD  SKIN: skin color, texture, turgor are normal, no rashes or significant lesions EYES: conjunctiva are pink and non-injected, sclera clear LUNGS: clear to auscultation and percussion with normal breathing effort HEART: regular rate & rhythm and no murmurs and no lower extremity edema Musculoskeletal: no cyanosis of digits and no clubbing  PSYCH: alert & oriented x 3, fluent speech NEURO: no focal motor/sensory deficits  LABORATORY DATA:  I have reviewed the data as listed    Latest Ref Rng & Units 12/08/2023    2:35 PM 09/16/2020    1:59 AM 09/15/2020    6:18 AM  CBC  WBC 4.0 - 10.5 K/uL 9.9  9.6  7.7   Hemoglobin 12.0 - 15.0 g/dL 16.1  8.3  8.9   Hematocrit 36.0 - 46.0 % 35.1  25.3  27.0   Platelets 150 - 400 K/uL 515  354  323        Latest Ref Rng & Units 12/08/2023    2:35 PM 09/16/2020    1:59 AM 09/15/2020    6:18 AM  CMP  Glucose 70 - 99 mg/dL 99  096  045   BUN 8 - 23 mg/dL 23  15  16    Creatinine 0.44 - 1.00 mg/dL 4.09  8.11  9.14   Sodium 135 - 145 mmol/L 137  135  133   Potassium 3.5 - 5.1 mmol/L 4.4  4.2  4.6   Chloride 98 - 111  mmol/L 102  102  101   CO2 22 - 32 mmol/L 28  26  23    Calcium 8.9 - 10.3 mg/dL 9.5  8.7  8.4   Total Protein 6.5 - 8.1 g/dL 7.3     Total Bilirubin <1.2 mg/dL 0.3     Alkaline Phos 38 - 126 U/L 66     AST 15 - 41 U/L 38     ALT 0 - 44 U/L 40        ASSESSMENT & PLAN Sherley R Collyer 82 y.o. female with medical history significant for anxiety, RA, GERD, and HTN who presents for evaluation of thrombocytosis.   After review of the labs, review of the records, and discussion with the patient the patients findings are most  consistent with thrombocytosis of unclear etiology  There are two types of thrombocytosis, essential thrombocytosis and secondary thrombocytosis. Essential thrombocytosis is overproduction of platelets due to a driver mutation. The most common mutation is the JAK2 [id number] (50% of cases), but other causes include CALR, MPL, and mutations of unclear significance on NGS. Essential thrombocytosis is a myeloproliferative neoplasm which may require cytoreductive therapy do decrease risk of thrombosis. Secondary thrombocytosis can be caused by low iron levels, increased inflammation,  or splenectomy.  Our workup will focus on determining if there is a secondary cause of this patient's thrombocytosis.    # Thrombocytosis, Unclear Etiology  --workup to include CBC, CMP, ESR/CRP, and iron panel/ferritin  --patient has no history of splenectomy     --will order MPN workup to include JAK2 with reflex and BCR/ABL FISH  --RTC in 3 months or sooner if indicated by the above labs.    Orders Placed This Encounter  Procedures   CBC with Differential (Cancer Center Only)    Standing Status:   Future    Number of Occurrences:   1    Standing Expiration Date:   12/06/2024   CMP (Cancer Center only)    Standing Status:   Future    Number of Occurrences:   1    Standing Expiration Date:   12/06/2024   Iron and Iron Binding Capacity ([facility] only)    Standing Status:   Future    Number of Occurrences:   1    Standing Expiration Date:   12/06/2024   Ferritin    Standing Status:   Future    Number of Occurrences:   1    Standing Expiration Date:   12/06/2024   Retic Panel    Standing Status:   Future    Number of Occurrences:   1    Standing Expiration Date:   12/06/2024   Sedimentation rate    Standing Status:   Future    Number of Occurrences:   1    Standing Expiration Date:   12/06/2024   C-reactive protein    Standing Status:   Future    Number of Occurrences:   1    Standing Expiration Date:    12/06/2024   BCR-ABL1 FISH    Standing Status:   Future    Number of Occurrences:   1    Standing Expiration Date:   12/06/2024   Miscellaneous [facility] test (send-out)    All questions were answered. The patient knows to call the clinic with any problems, questions or concerns.  A total of more than 60 minutes were spent on this encounter with face-to-face time and non-face-to-face time, including preparing to see the patient, ordering tests and/or  medications, counseling the patient and coordination of care as outlined above.   Ulysees Barns, MD Department of Hematology/Oncology [facility] at [facility] Phone: [phone] Pager: [phone] Email: [email or web address]  12/08/2023 4:06 PM

## 2023-12-08 ENCOUNTER — Inpatient Hospital Stay: Payer: Medicare Other | Attending: Hematology and Oncology | Admitting: Hematology and Oncology

## 2023-12-08 ENCOUNTER — Inpatient Hospital Stay: Payer: Medicare Other

## 2023-12-08 VITALS — BP 161/68 | HR 91 | Temp 97.8°F | Resp 16 | Wt 180.1 lb

## 2023-12-08 DIAGNOSIS — D473 Essential (hemorrhagic) thrombocythemia: Secondary | ICD-10-CM | POA: Diagnosis not present

## 2023-12-08 DIAGNOSIS — D75838 Other thrombocytosis: Secondary | ICD-10-CM | POA: Diagnosis not present

## 2023-12-08 DIAGNOSIS — D75839 Thrombocytosis, unspecified: Secondary | ICD-10-CM | POA: Diagnosis not present

## 2023-12-08 DIAGNOSIS — Z79899 Other long term (current) drug therapy: Secondary | ICD-10-CM | POA: Insufficient documentation

## 2023-12-08 LAB — CMP (CANCER CENTER ONLY)
ALT: 40 U/L (ref 0–44)
AST: 38 U/L (ref 15–41)
Albumin: 4.6 g/dL (ref 3.5–5.0)
Alkaline Phosphatase: 66 U/L (ref 38–126)
Anion gap: 7 (ref 5–15)
BUN: 23 mg/dL (ref 8–23)
CO2: 28 mmol/L (ref 22–32)
Calcium: 9.5 mg/dL (ref 8.9–10.3)
Chloride: 102 mmol/L (ref 98–111)
Creatinine: 0.94 mg/dL (ref 0.44–1.00)
GFR, Estimated: >60 mL/min (ref >60)
Glucose, Bld: 99 mg/dL (ref 70–99)
Potassium: 4.4 mmol/L (ref 3.5–5.1)
Sodium: 137 mmol/L (ref 135–145)
Total Bilirubin: 0.3 mg/dL (ref <1.2)
Total Protein: 7.3 g/dL (ref 6.5–8.1)

## 2023-12-08 LAB — CBC WITH DIFFERENTIAL (CANCER CENTER ONLY)
Abs Immature Granulocytes: 0.13 10*3/uL — ABNORMAL HIGH (ref 0.00–0.07)
Basophils Absolute: 0.2 10*3/uL — ABNORMAL HIGH (ref 0.0–0.1)
Basophils Relative: 2 %
Eosinophils Absolute: 0.1 10*3/uL (ref 0.0–0.5)
Eosinophils Relative: 1 %
HCT: 35.1 % — ABNORMAL LOW (ref 36.0–46.0)
Hemoglobin: 11.6 g/dL — ABNORMAL LOW (ref 12.0–15.0)
Immature Granulocytes: 1 %
Lymphocytes Relative: 15 %
Lymphs Abs: 1.5 10*3/uL (ref 0.7–4.0)
MCH: 32.6 pg (ref 26.0–34.0)
MCHC: 33 g/dL (ref 30.0–36.0)
MCV: 98.6 fL (ref 80.0–100.0)
Monocytes Absolute: 1 10*3/uL (ref 0.1–1.0)
Monocytes Relative: 10 %
Neutro Abs: 6.9 10*3/uL (ref 1.7–7.7)
Neutrophils Relative %: 71 %
Platelet Count: 515 10*3/uL — ABNORMAL HIGH (ref 150–400)
RBC: 3.56 MIL/uL — ABNORMAL LOW (ref 3.87–5.11)
RDW: 16.3 % — ABNORMAL HIGH (ref 11.5–15.5)
WBC Count: 9.9 10*3/uL (ref 4.0–10.5)
nRBC: 0 % (ref 0.0–0.2)

## 2023-12-08 LAB — SEDIMENTATION RATE: Sed Rate: 14 mm/h (ref 0–22)

## 2023-12-08 LAB — C-REACTIVE PROTEIN: CRP: 0.7 mg/dL (ref <1.0)

## 2023-12-08 LAB — IRON AND IRON BINDING CAPACITY (CC-WL,HP ONLY)
Iron: 82 ug/dL (ref 28–170)
Saturation Ratios: 23 % (ref 10.4–31.8)
TIBC: 364 ug/dL (ref 250–450)
UIBC: 282 ug/dL (ref 148–442)

## 2023-12-08 LAB — RETIC PANEL
Immature Retic Fract: 22.8 % — ABNORMAL HIGH (ref 2.3–15.9)
RBC.: 3.53 MIL/uL — ABNORMAL LOW (ref 3.87–5.11)
Retic Count, Absolute: 54.4 10*3/uL (ref 19.0–186.0)
Retic Ct Pct: 1.5 % (ref 0.4–3.1)
Reticulocyte Hemoglobin: 38.7 pg (ref >27.9)

## 2023-12-09 LAB — FERRITIN: Ferritin: 76 ng/mL (ref 11–307)

## 2023-12-13 LAB — BCR-ABL1 FISH
Cells Analyzed: 200
Cells Counted: 200

## 2023-12-14 LAB — MISC LABCORP TEST (SEND OUT): Labcorp test code: 489514

## 2023-12-15 ENCOUNTER — Telehealth: Payer: Self-pay | Admitting: *Deleted

## 2023-12-15 ENCOUNTER — Other Ambulatory Visit: Payer: Self-pay | Admitting: Hematology and Oncology

## 2023-12-15 DIAGNOSIS — D75839 Thrombocytosis, unspecified: Secondary | ICD-10-CM

## 2023-12-15 NOTE — Telephone Encounter (Signed)
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 If she is willing we can schedule a bone marrow biopsy to confirm the diagnosis. Please let me know if she is agreeable. ----- Message ----- From: Interface, Lab In [place] Sent: 12/14/2023   1:36 PM EST To: Jaci Standard, MD

## 2023-12-15 NOTE — Telephone Encounter (Signed)
TCT patient regarding recent lab results.  Spoke with her.  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 If she is willing we can schedule a bone marrow biopsy to confirm the diagnosis. Explained what is involved in having a bone marrow biopsy. Pt states she is agreeable to this procedure. Advised that Dr. Leonides Schanz will order this and she can expect to hear from a radiology scheduler in the next week or so. She voiced understanding. Dr. Leonides Schanz made aware aware of the above.

## 2023-12-24 ENCOUNTER — Other Ambulatory Visit: Payer: Self-pay | Admitting: Radiology

## 2023-12-24 DIAGNOSIS — D75839 Thrombocytosis, unspecified: Secondary | ICD-10-CM

## 2023-12-27 ENCOUNTER — Other Ambulatory Visit: Payer: Self-pay | Admitting: Radiology

## 2023-12-27 DIAGNOSIS — D75839 Thrombocytosis, unspecified: Secondary | ICD-10-CM

## 2023-12-27 NOTE — Consult Note (Addendum)
 Chief Complaint: Patient was seen in consultation today for CT-guided bone marrow biopsy  Referring Physician(s): Dorsey,John T IV  Supervising Physician: Luverne Aran  Patient Status: [facility] - Out-pt  History of Present Illness: Rhonda [name] is an 82 y.o. female with past medical history significant for anxiety, rheumatoid/osteo arthritis, GERD, hypertension who presents now with persistent thrombocytosis and positive CALR mutation suggesting essential thrombocytosis.  She is scheduled today for CT-guided bone marrow biopsy for further evaluation/confirm diagnosis.  Past Medical History:  Diagnosis Date   Anxiety    pt. may have panicy feeling upon awaking from anesth. - states she feels anxious at times  & has used Paxil  & xanax in the [past but its been a very long time ago    Arthritis    rheumatoid & OA-hands, shoulders    Dyspnea    GERD (gastroesophageal reflux disease)    Heart murmur    Hypertension     Past Surgical History:  Procedure Laterality Date   APPENDECTOMY     laproscopic   BREAST BIOPSY Left 09/10/2023   MM LT BREAST BX W LOC DEV 1ST LESION IMAGE BX SPEC STEREO GUIDE 09/10/2023 [facility]   BREAST BIOPSY Left 09/10/2023   MM LT BREAST BX W LOC DEV EA AD LESION IMG BX SPEC STEREO GUIDE 09/10/2023 [facility]   COLONOSCOPY     HEMORROIDECTOMY     INSERTION OF MESH N/A 09/12/2018   Procedure: INSERTION OF MESH;  Surgeon: Curvin Deward MOULD, MD;  Location: [facility] OR;  Service: General;  Laterality: N/A;   LAPAROSCOPIC APPENDECTOMY N/A 01/06/2017   Procedure: LAPAROSCOPIC APPENDECTOMY;  Surgeon: Deward Curvin III, MD;  Location: [facility] OR;  Service: General;  Laterality: N/A;   TONSILLECTOMY     as a child   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   Procedure: HIP ARTHROPLASTY ANTERIOR APPROACH;  Surgeon: Addie Cordella Hamilton, MD;  Location: [facility] OR;  Service: Orthopedics;  Laterality: Left;   VAGINAL DELIVERY     x2   VENTRAL HERNIA REPAIR N/A 09/12/2018    Procedure: LAPAROSCOPIC VENTRAL HERNIA REPAIR WITH MESH;  Surgeon: Curvin Deward MOULD, MD;  Location[facility] OR;  Service: General;  Laterality: N/A;   VIDEO BRONCHOSCOPY Bilateral 11/07/2019   Procedure: VIDEO BRONCHOSCOPY WITH FLUORO;  Surgeon: Shellia Oh, MD;  Location: [facility];  Service: Endoscopy;  Laterality: Bilateral;    Allergies: Aleve [naproxen]  Medications: Prior to Admission medications   Medication Sig Start Date End Date Taking? Authorizing Provider  acetaminophen  (TYLENOL ) 650 MG CR tablet Take 1,300 mg by mouth every 8 (eight) hours as needed for pain.    [provider]  amLODipine  (NORVASC ) 5 MG tablet Take 5 mg by mouth daily. 10/20/19   [provider]  Carboxymethylcellulose Sodium (REFRESH LIQUIGEL) 1 % GEL Place 1 drop into both eyes at bedtime.    [provider]  Cholecalciferol (VITAMIN D) 2000 units CAPS Take 2,000 Units by mouth daily.     [provider]  clobetasol cream (TEMOVATE) 0.05 % Apply 1 application topically 2 (two) times daily as needed (irritation).    [provider]  diclofenac Sodium (VOLTAREN) 1 % GEL Apply 1 application topically 2 (two) times daily as needed (arthritis pain).    [provider]  estradiol (ESTRACE) 0.1 MG/GM vaginal cream Place 1 Applicatorful vaginally daily as needed (for vaginal discomfort/dryness).     [provider]  folic acid  (FOLVITE ) 800 MCG tablet Take 800 mcg by mouth daily.  [provider]  Glucosamine-Chondroitin (MOVE FREE PO) Take 1 capsule by mouth 2 (two) times daily.    [provider]  hydroxychloroquine  (PLAQUENIL ) 200 MG tablet Take 200 mg by mouth 2 (two) times daily. 09/16/16   [provider]  levothyroxine  (SYNTHROID , LEVOTHROID) 25 MCG tablet Take 25 mcg by mouth daily before breakfast.  09/16/16   [provider]  methotrexate  (RHEUMATREX) 2.5 MG tablet Take 12.5 mg by mouth every Friday.  10/04/16    [provider]  Multiple Minerals-Vitamins (CALCIUM-MAGNESIUM -ZINC-D3 PO) Take 1 tablet by mouth 2 (two) times daily.     [provider]  Multiple Vitamin (MULTIVITAMIN WITH MINERALS) TABS tablet Take 1 tablet by mouth daily.    [provider]  olmesartan  (BENICAR ) 20 MG tablet Take 20 mg by mouth daily.     [provider]  omeprazole (PRILOSEC) 40 MG capsule Take 40 mg by mouth daily. 09/16/16   [provider]  PARoxetine  (PAXIL ) 20 MG tablet Take 20 mg by mouth daily. 08/30/20   [provider]  polyethylene glycol (MIRALAX / GLYCOLAX) packet Take 17 g by mouth daily.     [provider]  Polyvinyl Alcohol -Povidone PF (REFRESH) 1.4-0.6 % SOLN Place 1 drop into both eyes daily.    [provider]  Probiotic Product (PROBIOTIC DAILY PO) Take 1 capsule by mouth daily.    [provider]  simvastatin  (ZOCOR ) 40 MG tablet Take 40 mg by mouth at bedtime.  09/16/16   [provider]  TURMERIC PO Take 2 capsules by mouth daily.     [provider]     Family History  Problem Relation Age of Onset   Breast cancer Daughter    Breast cancer Maternal Grandfather     Social History   Socioeconomic History   Marital status: Married    Spouse name: Not on file   Number of children: Not on file   Years of education: Not on file   Highest education level: Not on file  Occupational History   Not on file  Tobacco Use   Smoking status: Never   Smokeless tobacco: Never  Substance and Sexual Activity   Alcohol  use: Yes    Comment: daily- wine & bourbon & ginger    Drug use: No   Sexual activity: Not on file  Other Topics Concern   Not on file  Social History Narrative   Not on file   Social Drivers of Health   Financial Resource Strain: Not on file  Food Insecurity: Not on file  Transportation Needs: Not on file  Physical Activity: Not on file  Stress: Not on file  Social Connections: Not  on file      Review of Systems denies fever,CP,dyspnea, abd pain,N/V or bleeding; she does have HA, back pain, occ cough  Vital Signs: Vitals:   12/28/23 0920  BP: (!) 159/62  Pulse: 79  Resp: 16  Temp: 98.3 F (36.8 C)  SpO2: 99%      Code Status: FULL CODE  Advance Care Plan: No documents on file   Physical Exam; awake/alert; chest- CTA bilat; heart- RRR, +murmur; abd- soft,+BS,NT; no LE edema  Imaging: No results found.  Labs:  CBC: Recent Labs    12/08/23 1435  WBC 9.9  HGB 11.6*  HCT 35.1*  PLT 515*    COAGS: No results for input(s): INR, APTT in the last 8760 hours.  BMP: Recent Labs    12/08/23 1435  NA 137  K 4.4  CL 102  CO2 28  GLUCOSE 99  BUN 23  CALCIUM 9.5  CREATININE 0.94  GFRNONAA >60    LIVER FUNCTION TESTS: Recent Labs    12/08/23 1435  BILITOT 0.3  AST 38  ALT 40  ALKPHOS 66  PROT 7.3  ALBUMIN  4.6    TUMOR MARKERS: No results for input(s): AFPTM, CEA, CA199, CHROMGRNA in the last 8760 hours.  Assessment and Plan: 82 y.o. female with past medical history significant for anxiety, rheumatoid/osteo arthritis, GERD, hypertension who presents now with persistent thrombocytosis and positive CALR mutation suggesting essential thrombocytosis.  She is scheduled today for CT-guided bone marrow biopsy for further evaluation/confirm diagnosis. Risks and benefits of procedure was discussed with the patient/spouse including, but not limited to bleeding, infection, damage to adjacent structures or low yield requiring additional tests.  All of the questions were answered and there is agreement to proceed.  Consent signed and in chart.    Thank you for this interesting consult.  I greatly enjoyed meeting Rhonda [name] and look forward to participating in their care.  A copy of this report was sent to the requesting provider on this date.  Electronically Signed: D. Franky Rakers, PA-C 12/27/2023, 1:44 PM   I spent a  total of  20 minutes   in face to face in clinical consultation, greater than 50% of which was counseling/coordinating care for CT guided bone marrow biopsy

## 2023-12-28 ENCOUNTER — Ambulatory Visit (HOSPITAL_COMMUNITY)
Admission: RE | Admit: 2023-12-28 | Discharge: 2023-12-28 | Disposition: A | Discharge status: Home / Self Care | Payer: Medicare Other | Source: Ambulatory Visit | Attending: Hematology and Oncology | Admitting: Hematology and Oncology

## 2023-12-28 ENCOUNTER — Ambulatory Visit (HOSPITAL_COMMUNITY)
Admission: RE | Admit: 2023-12-28 | Discharge: 2023-12-28 | Disposition: A | Discharge status: Home / Self Care | Payer: Medicare Other | Source: Ambulatory Visit | Attending: Hematology and Oncology

## 2023-12-28 ENCOUNTER — Other Ambulatory Visit: Payer: Self-pay

## 2023-12-28 ENCOUNTER — Encounter (HOSPITAL_COMMUNITY): Payer: Self-pay

## 2023-12-28 DIAGNOSIS — Z1379 Encounter for other screening for genetic and chromosomal anomalies: Secondary | ICD-10-CM | POA: Insufficient documentation

## 2023-12-28 DIAGNOSIS — D471 Chronic myeloproliferative disease: Secondary | ICD-10-CM | POA: Insufficient documentation

## 2023-12-28 DIAGNOSIS — M06841 Other specified rheumatoid arthritis, right hand: Secondary | ICD-10-CM | POA: Diagnosis not present

## 2023-12-28 DIAGNOSIS — M19042 Primary osteoarthritis, left hand: Secondary | ICD-10-CM | POA: Insufficient documentation

## 2023-12-28 DIAGNOSIS — D649 Anemia, unspecified: Secondary | ICD-10-CM | POA: Insufficient documentation

## 2023-12-28 DIAGNOSIS — M19011 Primary osteoarthritis, right shoulder: Secondary | ICD-10-CM | POA: Diagnosis not present

## 2023-12-28 DIAGNOSIS — D696 Thrombocytopenia, unspecified: Secondary | ICD-10-CM | POA: Diagnosis not present

## 2023-12-28 DIAGNOSIS — R011 Cardiac murmur, unspecified: Secondary | ICD-10-CM | POA: Insufficient documentation

## 2023-12-28 DIAGNOSIS — F419 Anxiety disorder, unspecified: Secondary | ICD-10-CM | POA: Insufficient documentation

## 2023-12-28 DIAGNOSIS — D75839 Thrombocytosis, unspecified: Secondary | ICD-10-CM | POA: Diagnosis not present

## 2023-12-28 DIAGNOSIS — M06811 Other specified rheumatoid arthritis, right shoulder: Secondary | ICD-10-CM | POA: Diagnosis not present

## 2023-12-28 DIAGNOSIS — M19012 Primary osteoarthritis, left shoulder: Secondary | ICD-10-CM | POA: Insufficient documentation

## 2023-12-28 DIAGNOSIS — I10 Essential (primary) hypertension: Secondary | ICD-10-CM | POA: Insufficient documentation

## 2023-12-28 DIAGNOSIS — M06812 Other specified rheumatoid arthritis, left shoulder: Secondary | ICD-10-CM | POA: Diagnosis not present

## 2023-12-28 DIAGNOSIS — K219 Gastro-esophageal reflux disease without esophagitis: Secondary | ICD-10-CM | POA: Diagnosis not present

## 2023-12-28 DIAGNOSIS — M19041 Primary osteoarthritis, right hand: Secondary | ICD-10-CM | POA: Insufficient documentation

## 2023-12-28 DIAGNOSIS — M06842 Other specified rheumatoid arthritis, left hand: Secondary | ICD-10-CM | POA: Diagnosis not present

## 2023-12-28 DIAGNOSIS — D704 Cyclic neutropenia: Secondary | ICD-10-CM | POA: Diagnosis not present

## 2023-12-28 LAB — CBC WITH DIFFERENTIAL/PLATELET
Abs Immature Granulocytes: 0.09 10*3/uL — ABNORMAL HIGH (ref 0.00–0.07)
Basophils Absolute: 0.1 10*3/uL (ref 0.0–0.1)
Basophils Relative: 2 %
Eosinophils Absolute: 0.1 10*3/uL (ref 0.0–0.5)
Eosinophils Relative: 1 %
HCT: 33.3 % — ABNORMAL LOW (ref 36.0–46.0)
Hemoglobin: 11.2 g/dL — ABNORMAL LOW (ref 12.0–15.0)
Immature Granulocytes: 1 %
Lymphocytes Relative: 15 %
Lymphs Abs: 1.1 10*3/uL (ref 0.7–4.0)
MCH: 33.4 pg (ref 26.0–34.0)
MCHC: 33.6 g/dL (ref 30.0–36.0)
MCV: 99.4 fL (ref 80.0–100.0)
Monocytes Absolute: 1 10*3/uL (ref 0.1–1.0)
Monocytes Relative: 13 %
Neutro Abs: 5.4 10*3/uL (ref 1.7–7.7)
Neutrophils Relative %: 68 %
Platelets: 515 10*3/uL — ABNORMAL HIGH (ref 150–400)
RBC: 3.35 MIL/uL — ABNORMAL LOW (ref 3.87–5.11)
RDW: 16.1 % — ABNORMAL HIGH (ref 11.5–15.5)
WBC: 7.8 10*3/uL (ref 4.0–10.5)
nRBC: 0 % (ref 0.0–0.2)

## 2023-12-28 MED ORDER — FENTANYL CITRATE (PF) 100 MCG/2ML IJ SOLN
INTRAMUSCULAR | Status: AC
  Filled 2023-12-28: qty 2

## 2023-12-28 MED ORDER — MIDAZOLAM HCL 2 MG/2ML IJ SOLN
INTRAMUSCULAR | Status: AC
  Filled 2023-12-28: qty 2

## 2023-12-28 MED ORDER — FENTANYL CITRATE (PF) 100 MCG/2ML IJ SOLN
INTRAMUSCULAR | Status: AC | PRN
  Administered 2023-12-28: 25 ug via INTRAVENOUS

## 2023-12-28 MED ORDER — FLUMAZENIL 0.5 MG/5ML IV SOLN
INTRAVENOUS | Status: AC
  Filled 2023-12-28: qty 5

## 2023-12-28 MED ORDER — LIDOCAINE HCL 1 % IJ SOLN
INTRAMUSCULAR | Status: AC | PRN
  Administered 2023-12-28: 10 mL via INTRADERMAL

## 2023-12-28 MED ORDER — SODIUM CHLORIDE 0.9 % IV SOLN: INTRAVENOUS | Status: DC

## 2023-12-28 MED ORDER — MIDAZOLAM HCL 2 MG/2ML IJ SOLN
INTRAMUSCULAR | Status: AC | PRN
  Administered 2023-12-28: .5 mg via INTRAVENOUS

## 2023-12-28 MED ORDER — SODIUM CHLORIDE 0.9% FLUSH: 3.0000 mL | Freq: Two times a day (BID) | INTRAVENOUS | Status: DC

## 2023-12-28 MED ORDER — NALOXONE HCL 0.4 MG/ML IJ SOLN
INTRAMUSCULAR | Status: AC
  Filled 2023-12-28: qty 1

## 2023-12-28 MED ORDER — SODIUM CHLORIDE 0.9% FLUSH: 3.0000 mL | INTRAVENOUS | Status: DC | PRN

## 2023-12-28 MED ORDER — MIDAZOLAM HCL 2 MG/2ML IJ SOLN
INTRAMUSCULAR | Status: AC | PRN
  Administered 2023-12-28: 1 mg via INTRAVENOUS

## 2023-12-28 NOTE — Discharge Instructions (Signed)
 For questions /concerns may call Interventional Radiology at [phone] or  Interventional Radiology clinic [phone]   You may remove your dressing and shower tomorrow afternoon                                                                                 Bone Marrow Aspiration and Bone Marrow Biopsy, Adult, Care After The following information offers guidance on how to care for yourself after your procedure. Your health care provider may also give you more specific instructions. If you have problems or questions, contact your health care provider. What can I expect after the procedure? After the procedure, it is common to have: Mild pain and tenderness. Swelling. Bruising. Follow these instructions at home: Incision care  Follow instructions from your health care provider about how to take care of the incision site. Make sure you: Wash your hands with soap and water for at least 20 seconds before and after you change your bandage (dressing). If soap and water are not available, use hand sanitizer. Change your dressing as told by your health care provider. Leave stitches (sutures), skin glue, or adhesive strips in place. These skin closures may need to stay in place for 2 weeks or longer. If adhesive strip edges start to loosen and curl up, you may trim the loose edges. Do not remove adhesive strips completely unless your health care provider tells you to do that. Check your incision site every day for signs of infection. Check for: More redness, swelling, or pain. Fluid or blood. Warmth. Pus or a bad smell. Activity Return to your normal activities as told by your health care provider. Ask your health care provider what activities are safe for you. Do not lift anything that is heavier than 10 lb (4.5 kg), or the limit that you are told, until your health care provider says that it is safe. If you were given a sedative during the procedure, it can affect you for several hours. Do  not drive or operate machinery until your health care provider says that it is safe. General instructions  Take over-the-counter and prescription medicines only as told by your health care provider. Do not take baths, swim, or use a hot tub until your health care provider approves. Ask your health care provider if you may take showers. You may only be allowed to take sponge baths. If directed, put ice on the affected area. To do this: Put ice in a plastic bag. Place a towel between your skin and the bag. Leave the ice on for 20 minutes, 2-3 times a day. If your skin turns bright red, remove the ice right away to prevent skin damage. The risk of skin damage is higher if you cannot feel pain, heat, or cold. Contact a health care provider if: You have signs of infection. Your pain is not controlled with medicine. You have cancer, and a temperature of 100.88F (38C) or higher. Get help right away if: You have a temperature of 101F (38.3C) or higher, or as told by your health care provider. You have bleeding from the incision site that cannot be controlled. This information is not intended to replace advice given to you by  your health care provider. Make sure you discuss any questions you have with your health care provider. Document Revised: 04/20/2022 Document Reviewed: 04/20/2022 Elsevier Patient Education  2024 [employer] Inc[employer]                                                                          Moderate Conscious Sedation, Adult, Care After After the procedure, it is common to have: Sleepiness for a few hours. Impaired judgment for a few hours. Trouble with balance. Nausea or vomiting if you eat too soon. Follow these instructions at home: For the time period you were told by your health care provider:  Rest. Do not participate in activities where you could fall or become injured. Do not drive or use machinery. Do not drink alcohol. Do not take sleeping pills or medicines that  cause drowsiness. Do not make important decisions or sign legal documents. Do not take care of children on your own. Eating and drinking Follow instructions from your health care provider about what you may eat and drink. Drink enough fluid to keep your urine pale yellow. If you vomit: Drink clear fluids slowly and in small amounts as you are able. Clear fluids include water, ice chips, low-calorie sports drinks, and fruit juice that has water added to it (diluted fruit juice). Eat light and bland foods in small amounts as you are able. These foods include bananas, applesauce, rice, lean meats, toast, and crackers. General instructions Take over-the-counter and prescription medicines only as told by your health care provider. Have a responsible adult stay with you for the time you are told. Do not use any products that contain nicotine or tobacco. These products include cigarettes, chewing tobacco, and vaping devices, such as e-cigarettes. If you need help quitting, ask your health care provider. Return to your normal activities as told by your health care provider. Ask your health care provider what activities are safe for you. Your health care provider may give you more instructions. Make sure you know what you can and cannot do. Contact a health care provider if: You are still sleepy or having trouble with balance after 24 hours. You feel light-headed. You vomit every time you eat or drink. You get a rash. You have a fever. You have redness or swelling around the IV site. Get help right away if: You have trouble breathing. You start to feel confused at home. These symptoms may be an emergency. Get help right away. Call 911. Do not wait to see if the symptoms will go away. Do not drive yourself to the hospital. This information is not intended to replace advice given to you by your health care provider. Make sure you discuss any questions you have with your health care  provider. Document Revised: 06/29/2022 Document Reviewed: 06/29/2022 Elsevier Patient Education  2024 [employer].

## 2023-12-28 NOTE — Procedures (Signed)
 Interventional Radiology Procedure Note  Procedure: CT guided bone marrow aspiration and biopsy  Complications: None  EBL: < 10 mL  Findings: Aspirate and core biopsy performed of bone marrow in right iliac bone.  Plan: Bedrest supine x 1 hrs  Rhonda [name] T. Fredia Sorrow, M.D Pager:  [phone]

## 2023-12-31 LAB — SURGICAL PATHOLOGY

## 2024-01-03 ENCOUNTER — Telehealth: Payer: Self-pay | Admitting: Hematology and Oncology

## 2024-01-07 ENCOUNTER — Inpatient Hospital Stay: Payer: Medicare Other | Attending: Hematology and Oncology | Admitting: Hematology and Oncology

## 2024-01-07 ENCOUNTER — Other Ambulatory Visit (HOSPITAL_COMMUNITY): Payer: Self-pay

## 2024-01-07 VITALS — BP 139/55 | HR 78 | Temp 97.6°F | Resp 15 | Wt 180.9 lb

## 2024-01-07 DIAGNOSIS — Z79899 Other long term (current) drug therapy: Secondary | ICD-10-CM | POA: Diagnosis not present

## 2024-01-07 DIAGNOSIS — D75839 Thrombocytosis, unspecified: Secondary | ICD-10-CM | POA: Diagnosis not present

## 2024-01-07 DIAGNOSIS — D473 Essential (hemorrhagic) thrombocythemia: Secondary | ICD-10-CM | POA: Diagnosis not present

## 2024-01-07 MED ORDER — HYDROXYUREA 500 MG PO CAPS
500.0000 mg | ORAL_CAPSULE | Freq: Every day | ORAL | 1 refills | Status: DC
  Filled 2024-01-07: qty 90, 90d supply, fill #0

## 2024-01-07 NOTE — Progress Notes (Signed)
 [facility] [phone]   [phone]  PROGRESS NOTE  Patient Care Team: Elliot Charm, MD as PCP - General (Internal Medicine)  Hematological/Oncological History # Essential thrombocytosis, CALR positive # Thrombocytosis 11/16/2023: WBC 7.2, Hgb 11.2, MCV 96.4, Plt 448 12/08/2023: establish care with Dr. Federico.  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  Interval History:  Rhonda [name] 83 y.o. female with medical history significant for newly diagnosed essential thrombocytosis who presents for a follow up visit. The patient's last visit was on 12/08/2023 at which time she established care. In the interim since the last visit her mutational panel returned with a CALR mutation and diagnosis was confirmed with bone marrow biopsy.  On exam today Rhonda [name] reports that she tolerated the bone marrow biopsy procedure well with very minimal pain and discomfort.  She reports there was not any bruising or bleeding after the procedure.  She reports that there was a little bit of itching at the site of the bandage.  She notes that otherwise she has had no changes in her health in the interim since her last visit.  She denies any bleeding, bruising, or dark stools.  A full 10 point ROS is otherwise negative.  The bulk of our discussion focused on the diagnosis of essential thrombocytosis.  We discussed the incurable nature of this disease and the need for cytoreductive therapy in order to prevent stroke, VTE, or other thrombotic complication.  We also discussed conversion into myelofibrosis or leukemia.  The patient voiced understanding of our findings and plan moving forward.  MEDICAL HISTORY:  Past Medical History:  Diagnosis Date   Anxiety    pt. may have panicy feeling upon awaking from anesth. - states she feels anxious at times  & has used  Paxil  & xanax in the [past but its been a very long time ago    Arthritis    rheumatoid & OA-hands, shoulders    Dyspnea    GERD (gastroesophageal reflux disease)    Heart murmur    Hypertension     SURGICAL HISTORY: Past Surgical History:  Procedure Laterality Date   APPENDECTOMY     laproscopic   BREAST BIOPSY Left 09/10/2023   MM LT BREAST BX W LOC DEV 1ST LESION IMAGE BX SPEC STEREO GUIDE 09/10/2023 [facility]   BREAST BIOPSY Left 09/10/2023   MM LT BREAST BX W LOC DEV EA AD LESION IMG BX SPEC STEREO GUIDE 09/10/2023 [facility]   COLONOSCOPY     HEMORROIDECTOMY     INSERTION OF MESH N/A 09/12/2018   Procedure: INSERTION OF MESH;  Surgeon: Curvin Deward MOULD, MD;  Location: [facility] OR;  Service: General;  Laterality: N/A;   LAPAROSCOPIC APPENDECTOMY N/A 01/06/2017   Procedure: LAPAROSCOPIC APPENDECTOMY;  Surgeon: Deward Curvin III, MD;  Location: [facility] OR;  Service: General;  Laterality: N/A;   TONSILLECTOMY     as a child   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   Procedure: HIP ARTHROPLASTY ANTERIOR APPROACH;  Surgeon: Addie Cordella Hamilton, MD;  Location: [facility] OR;  Service: Orthopedics;  Laterality: Left;   VAGINAL DELIVERY     x2   VENTRAL HERNIA REPAIR N/A 09/12/2018   Procedure: LAPAROSCOPIC VENTRAL HERNIA REPAIR WITH MESH;  Surgeon: Curvin Deward MOULD, MD;  Location: [facility] OR;  Service: General;  Laterality: N/A;   VIDEO BRONCHOSCOPY Bilateral 11/07/2019   Procedure: VIDEO BRONCHOSCOPY WITH FLUORO;  Surgeon: Shellia,  Carolynne, MD;  Location: [facility]COPY;  Service: Endoscopy;  Laterality: Bilateral;    SOCIAL HISTORY: Social History   Socioeconomic History   Marital status: Married    Spouse name: Not on file   Number of children: Not on file   Years of education: Not on file   Highest education level: Not on file  Occupational History   Not on file  Tobacco Use   Smoking status: Never   Smokeless tobacco: Never  Substance and Sexual Activity   Alcohol  use: Yes    Comment:  daily- wine & bourbon & ginger    Drug use: No   Sexual activity: Not on file  Other Topics Concern   Not on file  Social History Narrative   Not on file   Social Drivers of Health   Financial Resource Strain: Not on file  Food Insecurity: Not on file  Transportation Needs: Not on file  Physical Activity: Not on file  Stress: Not on file  Social Connections: Not on file  Intimate Partner Violence: Not on file    FAMILY HISTORY: Family History  Problem Relation Age of Onset   Breast cancer Daughter    Breast cancer Maternal Grandfather     ALLERGIES:  is allergic to aleve [naproxen].  MEDICATIONS:  Current Outpatient Medications  Medication Sig Dispense Refill   acetaminophen  (TYLENOL ) 650 MG CR tablet Take 1,300 mg by mouth every 8 (eight) hours as needed for pain.     amLODipine  (NORVASC ) 5 MG tablet Take 5 mg by mouth daily.     Carboxymethylcellulose Sodium (REFRESH LIQUIGEL) 1 % GEL Place 1 drop into both eyes at bedtime.     Cholecalciferol (VITAMIN D) 2000 units CAPS Take 2,000 Units by mouth daily.      clobetasol cream (TEMOVATE) 0.05 % Apply 1 application topically 2 (two) times daily as needed (irritation).     diclofenac Sodium (VOLTAREN) 1 % GEL Apply 1 application topically 2 (two) times daily as needed (arthritis pain).     estradiol (ESTRACE) 0.1 MG/GM vaginal cream Place 1 Applicatorful vaginally daily as needed (for vaginal discomfort/dryness).      folic acid  (FOLVITE ) 800 MCG tablet Take 800 mcg by mouth daily.      Glucosamine-Chondroitin (MOVE FREE PO) Take 1 capsule by mouth 2 (two) times daily.     hydroxychloroquine  (PLAQUENIL ) 200 MG tablet Take 200 mg by mouth 2 (two) times daily.     levothyroxine  (SYNTHROID , LEVOTHROID) 25 MCG tablet Take 25 mcg by mouth daily before breakfast.      methotrexate  (RHEUMATREX) 2.5 MG tablet Take 12.5 mg by mouth every Friday.      Multiple Minerals-Vitamins (CALCIUM-MAGNESIUM -ZINC-D3 PO) Take 1 tablet by mouth 2  (two) times daily.      Multiple Vitamin (MULTIVITAMIN WITH MINERALS) TABS tablet Take 1 tablet by mouth daily.     olmesartan  (BENICAR ) 20 MG tablet Take 20 mg by mouth daily.      omeprazole (PRILOSEC) 40 MG capsule Take 40 mg by mouth daily.     PARoxetine  (PAXIL ) 20 MG tablet Take 20 mg by mouth daily.     polyethylene glycol (MIRALAX / GLYCOLAX) packet Take 17 g by mouth daily.      Polyvinyl Alcohol -Povidone PF (REFRESH) 1.4-0.6 % SOLN Place 1 drop into both eyes daily.     Probiotic Product (PROBIOTIC DAILY PO) Take 1 capsule by mouth daily.     simvastatin  (ZOCOR ) 40 MG tablet Take 40 mg by mouth at  bedtime.      TURMERIC PO Take 2 capsules by mouth daily.      No current facility-administered medications for this visit.    REVIEW OF SYSTEMS:   Constitutional: ( - ) fevers, ( - )  chills , ( - ) night sweats Eyes: ( - ) blurriness of vision, ( - ) double vision, ( - ) watery eyes Ears, nose, mouth, throat, and face: ( - ) mucositis, ( - ) sore throat Respiratory: ( - ) cough, ( - ) dyspnea, ( - ) wheezes Cardiovascular: ( - ) palpitation, ( - ) chest discomfort, ( - ) lower extremity swelling Gastrointestinal:  ( - ) nausea, ( - ) heartburn, ( - ) change in bowel habits Skin: ( - ) abnormal skin rashes Lymphatics: ( - ) new lymphadenopathy, ( - ) easy bruising Neurological: ( - ) numbness, ( - ) tingling, ( - ) new weaknesses Behavioral/Psych: ( - ) mood change, ( - ) new changes  All other systems were reviewed with the patient and are negative.  PHYSICAL EXAMINATION:  Vitals:   01/07/24 1122  BP: (!) 139/55  Pulse: 78  Resp: 15  Temp: 97.6 F (36.4 C)  SpO2: 98%   Filed Weights   01/07/24 1122  Weight: 180 lb 14.4 oz (82.1 kg)    GENERAL: Well-appearing elderly Caucasian female, alert, no distress and comfortable SKIN: skin color, texture, turgor are normal, no rashes or significant lesions EYES: conjunctiva are pink and non-injected, sclera clear LUNGS: clear  to auscultation and percussion with normal breathing effort HEART: regular rate & rhythm and no murmurs and no lower extremity edema Musculoskeletal: no cyanosis of digits and no clubbing  PSYCH: alert & oriented x 3, fluent speech NEURO: no focal motor/sensory deficits  LABORATORY DATA:  I have reviewed the data as listed    Latest Ref Rng & Units 12/28/2023    9:30 AM 12/08/2023    2:35 PM 09/16/2020    1:59 AM  CBC  WBC 4.0 - 10.5 K/uL 7.8  9.9  9.6   Hemoglobin 12.0 - 15.0 g/dL 88.7  88.3  8.3   Hematocrit 36.0 - 46.0 % 33.3  35.1  25.3   Platelets 150 - 400 K/uL 515  515  354        Latest Ref Rng & Units 12/08/2023    2:35 PM 09/16/2020    1:59 AM 09/15/2020    6:18 AM  CMP  Glucose 70 - 99 mg/dL 99  881  880   BUN 8 - 23 mg/dL 23  15  16    Creatinine 0.44 - 1.00 mg/dL 9.05  9.05  8.77   Sodium 135 - 145 mmol/L 137  135  133   Potassium 3.5 - 5.1 mmol/L 4.4  4.2  4.6   Chloride 98 - 111 mmol/L 102  102  101   CO2 22 - 32 mmol/L 28  26  23    Calcium 8.9 - 10.3 mg/dL 9.5  8.7  8.4   Total Protein 6.5 - 8.1 g/dL 7.3     Total Bilirubin <1.2 mg/dL 0.3     Alkaline Phos 38 - 126 U/L 66     AST 15 - 41 U/L 38     ALT 0 - 44 U/L 40        RADIOGRAPHIC STUDIES: I have personally reviewed the radiological images as listed and agreed with the findings in the report. CT BONE MARROW BIOPSY & ASPIRATION  Result Date: 12/28/2023 CLINICAL DATA:  Thrombocytosis and need for bone marrow biopsy. EXAM: CT GUIDED BONE MARROW ASPIRATION AND BIOPSY ANESTHESIA/SEDATION: Moderate (conscious) sedation was employed during this procedure. A total of Versed  2.0 mg and Fentanyl  100 mcg was administered intravenously. Moderate Sedation Time: 16 minutes. The patient's level of consciousness and vital signs were monitored continuously by radiology nursing throughout the procedure under my direct supervision. PROCEDURE: The procedure risks, benefits, and alternatives were explained to the patient.  Questions regarding the procedure were encouraged and answered. The patient understands and consents to the procedure. A time out was performed prior to initiating the procedure. The right gluteal region was prepped with chlorhexidine . Sterile gown and sterile gloves were used for the procedure. Local anesthesia was provided with 1% Lidocaine . Under CT guidance, an 11 gauge On Control bone cutting needle was advanced from a posterior approach into the right iliac bone. Needle positioning was confirmed with CT. Initial non heparinized and heparinized aspirate samples were obtained of bone marrow. Core biopsy was performed via the On Control drill needle. COMPLICATIONS: None FINDINGS: Inspection of initial aspirate did reveal visible particles. Intact core biopsy sample was obtained. IMPRESSION: CT guided bone marrow biopsy of right posterior iliac bone with both aspirate and core samples obtained. Electronically Signed   By: Marcey Moan M.D.   On: 12/28/2023 15:10    ASSESSMENT & PLAN Rhonda [name] 83 y.o. female with medical history significant for newly diagnosed essential thrombocytosis who presents for a follow up visit.  # Essential thrombocytosis, CALR Positive --Diagnosis confirmed by the presence of a CALR mutation as well as bone marrow biopsy showing abnormal megakaryocytes. --Will plan to proceed with hydroxyurea  500 mg p.o. daily as well as aspirin  81 mg p.o. daily for thromboprophylaxis -- Will assure CBC, CMP at each visit. -- Target platelets between 150 and 400.  Will attempt to minimalize drop in white blood cell or hemoglobin -- Return to clinic in 2 weeks time for labs in 4 weeks time in order to assure she is tolerating hydroxyurea  therapy well.  No orders of the defined types were placed in this encounter.   All questions were answered. The patient knows to call the clinic with any problems, questions or concerns.  A total of more than 30 minutes were spent on this  encounter with face-to-face time and non-face-to-face time, including preparing to see the patient, ordering tests and/or medications, counseling the patient and coordination of care as outlined above.   Norleen IVAR Kidney, MD Department of Hematology/Oncology [facility] Phone: [phone] Pager: [phone] Email: [email or web address]  01/07/2024 11:27 AM

## 2024-01-20 DIAGNOSIS — D75839 Thrombocytosis, unspecified: Secondary | ICD-10-CM | POA: Diagnosis not present

## 2024-01-20 DIAGNOSIS — Z79899 Other long term (current) drug therapy: Secondary | ICD-10-CM | POA: Diagnosis not present

## 2024-01-20 DIAGNOSIS — R202 Paresthesia of skin: Secondary | ICD-10-CM | POA: Diagnosis not present

## 2024-01-20 DIAGNOSIS — M0609 Rheumatoid arthritis without rheumatoid factor, multiple sites: Secondary | ICD-10-CM | POA: Diagnosis not present

## 2024-01-20 DIAGNOSIS — M1991 Primary osteoarthritis, unspecified site: Secondary | ICD-10-CM | POA: Diagnosis not present

## 2024-01-20 DIAGNOSIS — M545 Low back pain, unspecified: Secondary | ICD-10-CM | POA: Diagnosis not present

## 2024-01-21 ENCOUNTER — Inpatient Hospital Stay: Payer: Medicare Other

## 2024-01-21 ENCOUNTER — Other Ambulatory Visit: Payer: Self-pay | Admitting: Hematology and Oncology

## 2024-01-21 DIAGNOSIS — D473 Essential (hemorrhagic) thrombocythemia: Secondary | ICD-10-CM

## 2024-01-21 DIAGNOSIS — Z79899 Other long term (current) drug therapy: Secondary | ICD-10-CM | POA: Diagnosis not present

## 2024-01-21 LAB — CMP (CANCER CENTER ONLY)
ALT: 28 U/L (ref 0–44)
AST: 27 U/L (ref 15–41)
Albumin: 4.3 g/dL (ref 3.5–5.0)
Alkaline Phosphatase: 58 U/L (ref 38–126)
Anion gap: 6 (ref 5–15)
BUN: 22 mg/dL (ref 8–23)
CO2: 29 mmol/L (ref 22–32)
Calcium: 9.2 mg/dL (ref 8.9–10.3)
Chloride: 98 mmol/L (ref 98–111)
Creatinine: 0.99 mg/dL (ref 0.44–1.00)
GFR, Estimated: 57 mL/min — ABNORMAL LOW (ref >60)
Glucose, Bld: 99 mg/dL (ref 70–99)
Potassium: 4.7 mmol/L (ref 3.5–5.1)
Sodium: 133 mmol/L — ABNORMAL LOW (ref 135–145)
Total Bilirubin: 0.4 mg/dL (ref 0.0–1.2)
Total Protein: 6.8 g/dL (ref 6.5–8.1)

## 2024-01-21 LAB — CBC WITH DIFFERENTIAL (CANCER CENTER ONLY)
Abs Immature Granulocytes: 0.06 10*3/uL (ref 0.00–0.07)
Basophils Absolute: 0.2 10*3/uL — ABNORMAL HIGH (ref 0.0–0.1)
Basophils Relative: 2 %
Eosinophils Absolute: 0.1 10*3/uL (ref 0.0–0.5)
Eosinophils Relative: 1 %
HCT: 33.1 % — ABNORMAL LOW (ref 36.0–46.0)
Hemoglobin: 11 g/dL — ABNORMAL LOW (ref 12.0–15.0)
Immature Granulocytes: 1 %
Lymphocytes Relative: 17 %
Lymphs Abs: 1.3 10*3/uL (ref 0.7–4.0)
MCH: 32.5 pg (ref 26.0–34.0)
MCHC: 33.2 g/dL (ref 30.0–36.0)
MCV: 97.9 fL (ref 80.0–100.0)
Monocytes Absolute: 1 10*3/uL (ref 0.1–1.0)
Monocytes Relative: 13 %
Neutro Abs: 5.2 10*3/uL (ref 1.7–7.7)
Neutrophils Relative %: 66 %
Platelet Count: 315 10*3/uL (ref 150–400)
RBC: 3.38 MIL/uL — ABNORMAL LOW (ref 3.87–5.11)
RDW: 16.8 % — ABNORMAL HIGH (ref 11.5–15.5)
WBC Count: 7.8 10*3/uL (ref 4.0–10.5)
nRBC: 0 % (ref 0.0–0.2)

## 2024-01-24 ENCOUNTER — Telehealth: Payer: Self-pay | Admitting: *Deleted

## 2024-01-24 NOTE — Telephone Encounter (Signed)
TCT patient regarding recent lab results. Advised that we are off to a good start. Her Plt dropped to normal level at Plt 315. We will continue to monitor closely to assure it does not drop too low. Pt pleased with results. She is aware of her next lab appt.

## 2024-01-24 NOTE — Telephone Encounter (Signed)
-----   Message from Ulysees Barns IV sent at 01/24/2024 10:48 AM EST ----- Please let Mrs. Slaght know that we are off to a good start. Her Plt dropped to normal level at Plt 315. We will continue to monitor closely to assure it does not drop too low. ----- Message ----- From: Interface, Lab In [place] Sent: 01/21/2024  11:15 AM EST To: Jaci Standard, MD

## 2024-01-31 DIAGNOSIS — M65332 Trigger finger, left middle finger: Secondary | ICD-10-CM | POA: Diagnosis not present

## 2024-01-31 DIAGNOSIS — M65331 Trigger finger, right middle finger: Secondary | ICD-10-CM | POA: Diagnosis not present

## 2024-01-31 DIAGNOSIS — M65351 Trigger finger, right little finger: Secondary | ICD-10-CM | POA: Diagnosis not present

## 2024-02-04 ENCOUNTER — Other Ambulatory Visit: Payer: Self-pay | Admitting: Hematology and Oncology

## 2024-02-04 ENCOUNTER — Inpatient Hospital Stay: Payer: Medicare Other | Attending: Hematology and Oncology

## 2024-02-04 DIAGNOSIS — D473 Essential (hemorrhagic) thrombocythemia: Secondary | ICD-10-CM

## 2024-02-04 LAB — CMP (CANCER CENTER ONLY)
ALT: 35 U/L (ref 0–44)
AST: 26 U/L (ref 15–41)
Albumin: 4.3 g/dL (ref 3.5–5.0)
Alkaline Phosphatase: 57 U/L (ref 38–126)
Anion gap: 4 — ABNORMAL LOW (ref 5–15)
BUN: 21 mg/dL (ref 8–23)
CO2: 30 mmol/L (ref 22–32)
Calcium: 8.9 mg/dL (ref 8.9–10.3)
Chloride: 100 mmol/L (ref 98–111)
Creatinine: 0.91 mg/dL (ref 0.44–1.00)
GFR, Estimated: >60 mL/min (ref >60)
Glucose, Bld: 113 mg/dL — ABNORMAL HIGH (ref 70–99)
Potassium: 4.3 mmol/L (ref 3.5–5.1)
Sodium: 134 mmol/L — ABNORMAL LOW (ref 135–145)
Total Bilirubin: 0.4 mg/dL (ref 0.0–1.2)
Total Protein: 6.7 g/dL (ref 6.5–8.1)

## 2024-02-04 LAB — CBC WITH DIFFERENTIAL (CANCER CENTER ONLY)
Abs Immature Granulocytes: 0.12 10*3/uL — ABNORMAL HIGH (ref 0.00–0.07)
Basophils Absolute: 0.2 10*3/uL — ABNORMAL HIGH (ref 0.0–0.1)
Basophils Relative: 2 %
Eosinophils Absolute: 0 10*3/uL (ref 0.0–0.5)
Eosinophils Relative: 0 %
HCT: 32 % — ABNORMAL LOW (ref 36.0–46.0)
Hemoglobin: 10.6 g/dL — ABNORMAL LOW (ref 12.0–15.0)
Immature Granulocytes: 1 %
Lymphocytes Relative: 13 %
Lymphs Abs: 1.4 10*3/uL (ref 0.7–4.0)
MCH: 32.9 pg (ref 26.0–34.0)
MCHC: 33.1 g/dL (ref 30.0–36.0)
MCV: 99.4 fL (ref 80.0–100.0)
Monocytes Absolute: 2.3 10*3/uL — ABNORMAL HIGH (ref 0.1–1.0)
Monocytes Relative: 21 %
Neutro Abs: 7.3 10*3/uL (ref 1.7–7.7)
Neutrophils Relative %: 63 %
Platelet Count: 495 10*3/uL — ABNORMAL HIGH (ref 150–400)
RBC: 3.22 MIL/uL — ABNORMAL LOW (ref 3.87–5.11)
RDW: 18.7 % — ABNORMAL HIGH (ref 11.5–15.5)
WBC Count: 11.3 10*3/uL — ABNORMAL HIGH (ref 4.0–10.5)
nRBC: 0 % (ref 0.0–0.2)

## 2024-02-15 DIAGNOSIS — H11421 Conjunctival edema, right eye: Secondary | ICD-10-CM | POA: Diagnosis not present

## 2024-02-18 ENCOUNTER — Other Ambulatory Visit: Payer: Self-pay | Admitting: Hematology and Oncology

## 2024-02-18 ENCOUNTER — Inpatient Hospital Stay: Payer: Medicare Other

## 2024-02-18 DIAGNOSIS — D473 Essential (hemorrhagic) thrombocythemia: Secondary | ICD-10-CM

## 2024-02-18 LAB — CBC WITH DIFFERENTIAL (CANCER CENTER ONLY)
Abs Immature Granulocytes: 0.12 10*3/uL — ABNORMAL HIGH (ref 0.00–0.07)
Basophils Absolute: 0.2 10*3/uL — ABNORMAL HIGH (ref 0.0–0.1)
Basophils Relative: 2 %
Eosinophils Absolute: 0 10*3/uL (ref 0.0–0.5)
Eosinophils Relative: 0 %
HCT: 34.1 % — ABNORMAL LOW (ref 36.0–46.0)
Hemoglobin: 11.3 g/dL — ABNORMAL LOW (ref 12.0–15.0)
Immature Granulocytes: 1 %
Lymphocytes Relative: 14 %
Lymphs Abs: 1.4 10*3/uL (ref 0.7–4.0)
MCH: 33.1 pg (ref 26.0–34.0)
MCHC: 33.1 g/dL (ref 30.0–36.0)
MCV: 100 fL (ref 80.0–100.0)
Monocytes Absolute: 1.5 10*3/uL — ABNORMAL HIGH (ref 0.1–1.0)
Monocytes Relative: 15 %
Neutro Abs: 6.9 10*3/uL (ref 1.7–7.7)
Neutrophils Relative %: 68 %
Platelet Count: 324 10*3/uL (ref 150–400)
RBC: 3.41 MIL/uL — ABNORMAL LOW (ref 3.87–5.11)
RDW: 19.1 % — ABNORMAL HIGH (ref 11.5–15.5)
WBC Count: 10.1 10*3/uL (ref 4.0–10.5)
nRBC: 0 % (ref 0.0–0.2)

## 2024-02-18 LAB — CMP (CANCER CENTER ONLY)
ALT: 35 U/L (ref 0–44)
AST: 28 U/L (ref 15–41)
Albumin: 4.5 g/dL (ref 3.5–5.0)
Alkaline Phosphatase: 58 U/L (ref 38–126)
Anion gap: 5 (ref 5–15)
BUN: 17 mg/dL (ref 8–23)
CO2: 30 mmol/L (ref 22–32)
Calcium: 9.4 mg/dL (ref 8.9–10.3)
Chloride: 94 mmol/L — ABNORMAL LOW (ref 98–111)
Creatinine: 0.91 mg/dL (ref 0.44–1.00)
GFR, Estimated: >60 mL/min (ref >60)
Glucose, Bld: 101 mg/dL — ABNORMAL HIGH (ref 70–99)
Potassium: 4.8 mmol/L (ref 3.5–5.1)
Sodium: 129 mmol/L — ABNORMAL LOW (ref 135–145)
Total Bilirubin: 0.4 mg/dL (ref 0.0–1.2)
Total Protein: 6.9 g/dL (ref 6.5–8.1)

## 2024-03-08 ENCOUNTER — Other Ambulatory Visit: Payer: Self-pay | Admitting: Hematology and Oncology

## 2024-03-08 ENCOUNTER — Inpatient Hospital Stay: Payer: Medicare Other | Admitting: Hematology and Oncology

## 2024-03-08 ENCOUNTER — Inpatient Hospital Stay: Payer: Medicare Other | Attending: Hematology and Oncology

## 2024-03-08 VITALS — BP 154/70 | HR 84 | Temp 97.7°F | Resp 15 | Wt 183.8 lb

## 2024-03-08 DIAGNOSIS — D473 Essential (hemorrhagic) thrombocythemia: Secondary | ICD-10-CM | POA: Insufficient documentation

## 2024-03-08 DIAGNOSIS — D75839 Thrombocytosis, unspecified: Secondary | ICD-10-CM

## 2024-03-08 DIAGNOSIS — Z7964 Long term (current) use of myelosuppressive agent: Secondary | ICD-10-CM | POA: Insufficient documentation

## 2024-03-08 DIAGNOSIS — Z79899 Other long term (current) drug therapy: Secondary | ICD-10-CM | POA: Diagnosis not present

## 2024-03-08 LAB — CBC WITH DIFFERENTIAL (CANCER CENTER ONLY)
Abs Immature Granulocytes: 0.04 10*3/uL (ref 0.00–0.07)
Basophils Absolute: 0.1 10*3/uL (ref 0.0–0.1)
Basophils Relative: 2 %
Eosinophils Absolute: 0.1 10*3/uL (ref 0.0–0.5)
Eosinophils Relative: 1 %
HCT: 32 % — ABNORMAL LOW (ref 36.0–46.0)
Hemoglobin: 10.6 g/dL — ABNORMAL LOW (ref 12.0–15.0)
Immature Granulocytes: 1 %
Lymphocytes Relative: 18 %
Lymphs Abs: 1.2 10*3/uL (ref 0.7–4.0)
MCH: 33.9 pg (ref 26.0–34.0)
MCHC: 33.1 g/dL (ref 30.0–36.0)
MCV: 102.2 fL — ABNORMAL HIGH (ref 80.0–100.0)
Monocytes Absolute: 0.7 10*3/uL (ref 0.1–1.0)
Monocytes Relative: 11 %
Neutro Abs: 4.6 10*3/uL (ref 1.7–7.7)
Neutrophils Relative %: 67 %
Platelet Count: 369 10*3/uL (ref 150–400)
RBC: 3.13 MIL/uL — ABNORMAL LOW (ref 3.87–5.11)
RDW: 20 % — ABNORMAL HIGH (ref 11.5–15.5)
WBC Count: 6.7 10*3/uL (ref 4.0–10.5)
nRBC: 0 % (ref 0.0–0.2)

## 2024-03-08 LAB — CMP (CANCER CENTER ONLY)
ALT: 31 U/L (ref 0–44)
AST: 30 U/L (ref 15–41)
Albumin: 4.5 g/dL (ref 3.5–5.0)
Alkaline Phosphatase: 50 U/L (ref 38–126)
Anion gap: 5 (ref 5–15)
BUN: 17 mg/dL (ref 8–23)
CO2: 29 mmol/L (ref 22–32)
Calcium: 8.8 mg/dL — ABNORMAL LOW (ref 8.9–10.3)
Chloride: 100 mmol/L (ref 98–111)
Creatinine: 0.94 mg/dL (ref 0.44–1.00)
GFR, Estimated: >60 mL/min (ref >60)
Glucose, Bld: 122 mg/dL — ABNORMAL HIGH (ref 70–99)
Potassium: 4.1 mmol/L (ref 3.5–5.1)
Sodium: 134 mmol/L — ABNORMAL LOW (ref 135–145)
Total Bilirubin: 0.4 mg/dL (ref 0.0–1.2)
Total Protein: 6.7 g/dL (ref 6.5–8.1)

## 2024-03-08 MED ORDER — HYDROXYUREA 500 MG PO CAPS: 500.0000 mg | ORAL_CAPSULE | Freq: Every day | ORAL | 1 refills | Status: DC

## 2024-03-08 NOTE — Progress Notes (Signed)
 [facility] [phone]   [phone]  PROGRESS NOTE  Patient Care Team: Lorenda Ishihara, MD as PCP - General (Internal Medicine)  Hematological/Oncological History # Essential thrombocytosis, CALR positive # Thrombocytosis 11/16/2023: WBC 7.2, Hgb 11.2, MCV 96.4, Plt 448 12/08/2023: establish care with Dr. Leonides Schanz.  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  Interval History:  DEZARAI [name] 83 y.o. female with medical history significant for newly diagnosed essential thrombocytosis who presents for a follow up visit. The patient's last visit was on 01/07/2024. In the interim since the last visit she has continued on hydroxyurea therapy.   On exam today Rhonda [name] reports she is tolerating the hydroxyurea 500 mg p.o. daily "okay".  She notes that she is having some bruising on her arms but no overt signs of bleeding elsewhere.  She did have a very minor nosebleed which consisted of a few drops of blood that she was able to control the tissue.  She has not been having any GI upset such as nausea, vomiting, or diarrhea.  Reports that she does have some areas of her gum which are sore but has not had any frank mouth ulcers or ulcers on her ankle.  She reports her biggest symptom is fatigue with poor energy.  She reports her energy is about a 5 out of 10.  She reports she does have some occasional headache in addition to her fatigue.  She feels like she is eating well and is able to do her day-to-day activities without difficulty..  She notes that otherwise she has had no changes in her health in the interim since her last visit.  She denies any bleeding, bruising, or dark stools.  A full 10 point ROS is otherwise negative.   MEDICAL HISTORY:  Past Medical History:  Diagnosis Date   Anxiety    pt. may have panicy feeling upon awaking from anesth. -  states she feels anxious at times  & has used Paxil & xanax in the [past but its been a very long time ago    Arthritis    rheumatoid & OA-hands, shoulders    Dyspnea    GERD (gastroesophageal reflux disease)    Heart murmur    Hypertension     SURGICAL HISTORY: Past Surgical History:  Procedure Laterality Date   APPENDECTOMY     laproscopic   BREAST BIOPSY Left 09/10/2023   MM LT BREAST BX W LOC DEV 1ST LESION IMAGE BX SPEC STEREO GUIDE 09/10/2023 [facility]   BREAST BIOPSY Left 09/10/2023   MM LT BREAST BX W LOC DEV EA AD LESION IMG BX SPEC STEREO GUIDE 09/10/2023 [facility]   COLONOSCOPY     HEMORROIDECTOMY     INSERTION OF MESH N/A 09/12/2018   Procedure: INSERTION OF MESH;  Surgeon: Griselda Miner, MD;  Location: [facility] OR;  Service: General;  Laterality: N/A;   LAPAROSCOPIC APPENDECTOMY N/A 01/06/2017   Procedure: LAPAROSCOPIC APPENDECTOMY;  Surgeon: Chevis Pretty III, MD;  Location: [facility] OR;  Service: General;  Laterality: N/A;   TONSILLECTOMY     as a child   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   Procedure: HIP ARTHROPLASTY ANTERIOR APPROACH;  Surgeon: Cammy Copa, MD;  Location: [facility] OR;  Service: Orthopedics;  Laterality: Left;   VAGINAL DELIVERY     x2   VENTRAL HERNIA REPAIR N/A 09/12/2018   Procedure: LAPAROSCOPIC VENTRAL HERNIA REPAIR WITH MESH;  Surgeon: Griselda Miner, MD;  [facility];  Service: General;  Laterality: N/A;   VIDEO BRONCHOSCOPY Bilateral 11/07/2019   Procedure: VIDEO BRONCHOSCOPY WITH FLUORO;  Surgeon: Coralyn Helling, MD;  Location: [facility] ENDOSCOPY;  Service: Endoscopy;  Laterality: Bilateral;    SOCIAL HISTORY: Social History   Socioeconomic History   Marital status: Married    Spouse name: Not on file   Number of children: Not on file   Years of education: Not on file   Highest education level: Not on file  Occupational History   Not on file  Tobacco Use   Smoking status: Never   Smokeless tobacco: Never  Substance and Sexual  Activity   Alcohol use: Yes    Comment: daily- wine & bourbon & ginger    Drug use: No   Sexual activity: Not on file  Other Topics Concern   Not on file  Social History Narrative   Not on file   Social Drivers of Health   Financial Resource Strain: Not on file  Food Insecurity: Not on file  Transportation Needs: Not on file  Physical Activity: Not on file  Stress: Not on file  Social Connections: Not on file  Intimate Partner Violence: Not on file    FAMILY HISTORY: Family History  Problem Relation Age of Onset   Breast cancer Daughter    Breast cancer Maternal Grandfather     ALLERGIES:  is allergic to aleve [naproxen].  MEDICATIONS:  Current Outpatient Medications  Medication Sig Dispense Refill   acetaminophen (TYLENOL) 650 MG CR tablet Take 1,300 mg by mouth every 8 (eight) hours as needed for pain.     amLODipine (NORVASC) 5 MG tablet Take 5 mg by mouth daily.     Carboxymethylcellulose Sodium (REFRESH LIQUIGEL) 1 % GEL Place 1 drop into both eyes at bedtime.     Cholecalciferol (VITAMIN D) 2000 units CAPS Take 2,000 Units by mouth daily.      clobetasol cream (TEMOVATE) 0.05 % Apply 1 application topically 2 (two) times daily as needed (irritation).     diclofenac Sodium (VOLTAREN) 1 % GEL Apply 1 application topically 2 (two) times daily as needed (arthritis pain).     estradiol (ESTRACE) 0.1 MG/GM vaginal cream Place 1 Applicatorful vaginally daily as needed (for vaginal discomfort/dryness).      folic acid (FOLVITE) 800 MCG tablet Take 800 mcg by mouth daily.      Glucosamine-Chondroitin (MOVE FREE PO) Take 1 capsule by mouth 2 (two) times daily.     hydroxychloroquine (PLAQUENIL) 200 MG tablet Take 200 mg by mouth 2 (two) times daily.     hydroxyurea (HYDREA) 500 MG capsule Take 1 capsule (500 mg total) by mouth daily. May take with food to minimize GI side effects. 90 capsule 1   levothyroxine (SYNTHROID, LEVOTHROID) 25 MCG tablet Take 25 mcg by mouth daily  before breakfast.      methotrexate (RHEUMATREX) 2.5 MG tablet Take 12.5 mg by mouth every Friday.      Multiple Minerals-Vitamins (CALCIUM-MAGNESIUM-ZINC-D3 PO) Take 1 tablet by mouth 2 (two) times daily.      Multiple Vitamin (MULTIVITAMIN WITH MINERALS) TABS tablet Take 1 tablet by mouth daily.     olmesartan (BENICAR) 20 MG tablet Take 20 mg by mouth daily.      omeprazole (PRILOSEC) 40 MG capsule Take 40 mg by mouth daily.     PARoxetine (PAXIL) 20 MG tablet Take 20 mg by mouth daily.     polyethylene glycol (  MIRALAX / GLYCOLAX) packet Take 17 g by mouth daily.      Polyvinyl Alcohol-Povidone PF (REFRESH) 1.4-0.6 % SOLN Place 1 drop into both eyes daily.     Probiotic Product (PROBIOTIC DAILY PO) Take 1 capsule by mouth daily.     simvastatin (ZOCOR) 40 MG tablet Take 40 mg by mouth at bedtime.      TURMERIC PO Take 2 capsules by mouth daily.      No current facility-administered medications for this visit.    REVIEW OF SYSTEMS:   Constitutional: ( - ) fevers, ( - )  chills , ( - ) night sweats Eyes: ( - ) blurriness of vision, ( - ) double vision, ( - ) watery eyes Ears, nose, mouth, throat, and face: ( - ) mucositis, ( - ) sore throat Respiratory: ( - ) cough, ( - ) dyspnea, ( - ) wheezes Cardiovascular: ( - ) palpitation, ( - ) chest discomfort, ( - ) lower extremity swelling Gastrointestinal:  ( - ) nausea, ( - ) heartburn, ( - ) change in bowel habits Skin: ( - ) abnormal skin rashes Lymphatics: ( - ) new lymphadenopathy, ( - ) easy bruising Neurological: ( - ) numbness, ( - ) tingling, ( - ) new weaknesses Behavioral/Psych: ( - ) mood change, ( - ) new changes  All other systems were reviewed with the patient and are negative.  PHYSICAL EXAMINATION:  Vitals:   03/08/24 1437  BP: (!) 154/70  Pulse: 84  Resp: 15  Temp: 97.7 F (36.5 C)  SpO2: 97%   Filed Weights   03/08/24 1437  Weight: 183 lb 12.8 oz (83.4 kg)    GENERAL: Well-appearing elderly Caucasian female,  alert, no distress and comfortable SKIN: skin color, texture, turgor are normal, no rashes or significant lesions EYES: conjunctiva are pink and non-injected, sclera clear LUNGS: clear to auscultation and percussion with normal breathing effort HEART: regular rate & rhythm and no murmurs and no lower extremity edema Musculoskeletal: no cyanosis of digits and no clubbing  PSYCH: alert & oriented x 3, fluent speech NEURO: no focal motor/sensory deficits  LABORATORY DATA:  I have reviewed the data as listed    Latest Ref Rng & Units 03/08/2024    2:15 PM 02/18/2024   11:59 AM 02/04/2024   11:02 AM  CBC  WBC 4.0 - 10.5 K/uL 6.7  10.1  11.3   Hemoglobin 12.0 - 15.0 g/dL 16.1  09.6  04.5   Hematocrit 36.0 - 46.0 % 32.0  34.1  32.0   Platelets 150 - 400 K/uL 369  324  495        Latest Ref Rng & Units 03/08/2024    2:15 PM 02/18/2024   11:59 AM 02/04/2024   11:02 AM  CMP  Glucose 70 - 99 mg/dL 409  811  914   BUN 8 - 23 mg/dL 17  17  21    Creatinine 0.44 - 1.00 mg/dL 7.82  9.56  2.13   Sodium 135 - 145 mmol/L 134  129  134   Potassium 3.5 - 5.1 mmol/L 4.1  4.8  4.3   Chloride 98 - 111 mmol/L 100  94  100   CO2 22 - 32 mmol/L 29  30  30    Calcium 8.9 - 10.3 mg/dL 8.8  9.4  8.9   Total Protein 6.5 - 8.1 g/dL 6.7  6.9  6.7   Total Bilirubin 0.0 - 1.2 mg/dL 0.4  0.4  0.4  Alkaline Phos 38 - 126 U/L 50  58  57   AST 15 - 41 U/L 30  28  26    ALT 0 - 44 U/L 31  35  35      RADIOGRAPHIC STUDIES: I have personally reviewed the radiological images as listed and agreed with the findings in the report. No results found.   ASSESSMENT & PLAN Aneri Slagel Lippert 83 y.o. female with medical history significant for newly diagnosed essential thrombocytosis who presents for a follow up visit.  # Essential thrombocytosis, CALR Positive --Diagnosis confirmed by the presence of a CALR mutation as well as bone marrow biopsy showing abnormal megakaryocytes. --Will plan to proceed with hydroxyurea 500 mg  p.o. daily as well as aspirin 81 mg p.o. daily for thromboprophylaxis -- Will assure CBC, CMP at each visit. -- Target platelets between 150 and 400.  Will attempt to minimalize drop in white blood cell or hemoglobin -- labs today show white blood cell 6.7, hemoglobin 10.6, MCV 102.2, platelets 369 -- Return to clinic in 4 and 8 weeks time for labs in 12 weeks time in order to assure she is tolerating hydroxyurea therapy well.  No orders of the defined types were placed in this encounter.   All questions were answered. The patient knows to call the clinic with any problems, questions or concerns.  A total of more than 30 minutes were spent on this encounter with face-to-face time and non-face-to-face time, including preparing to see the patient, ordering tests and/or medications, counseling the patient and coordination of care as outlined above.   Ulysees Barns, MD Department of Hematology/Oncology [facility] Phone: [phone] Pager: [phone] Email: [email or web address]  03/08/2024 4:42 PM

## 2024-03-13 ENCOUNTER — Other Ambulatory Visit: Payer: Self-pay | Admitting: *Deleted

## 2024-03-13 MED ORDER — HYDROXYCHLOROQUINE SULFATE 200 MG PO TABS: 200.0000 mg | ORAL_TABLET | Freq: Two times a day (BID) | ORAL | 1 refills | Status: DC

## 2024-03-31 DIAGNOSIS — H11423 Conjunctival edema, bilateral: Secondary | ICD-10-CM | POA: Diagnosis not present

## 2024-04-05 ENCOUNTER — Inpatient Hospital Stay: Attending: Hematology and Oncology

## 2024-04-05 ENCOUNTER — Other Ambulatory Visit: Payer: Self-pay | Admitting: Hematology and Oncology

## 2024-04-05 DIAGNOSIS — D473 Essential (hemorrhagic) thrombocythemia: Secondary | ICD-10-CM | POA: Diagnosis not present

## 2024-04-05 LAB — CBC WITH DIFFERENTIAL (CANCER CENTER ONLY)
Abs Immature Granulocytes: 0.03 10*3/uL (ref 0.00–0.07)
Basophils Absolute: 0.1 10*3/uL (ref 0.0–0.1)
Basophils Relative: 2 %
Eosinophils Absolute: 0 10*3/uL (ref 0.0–0.5)
Eosinophils Relative: 1 %
HCT: 30.5 % — ABNORMAL LOW (ref 36.0–46.0)
Hemoglobin: 10.2 g/dL — ABNORMAL LOW (ref 12.0–15.0)
Immature Granulocytes: 0 %
Lymphocytes Relative: 15 %
Lymphs Abs: 1.3 10*3/uL (ref 0.7–4.0)
MCH: 34.5 pg — ABNORMAL HIGH (ref 26.0–34.0)
MCHC: 33.4 g/dL (ref 30.0–36.0)
MCV: 103 fL — ABNORMAL HIGH (ref 80.0–100.0)
Monocytes Absolute: 0.7 10*3/uL (ref 0.1–1.0)
Monocytes Relative: 9 %
Neutro Abs: 6.4 10*3/uL (ref 1.7–7.7)
Neutrophils Relative %: 73 %
Platelet Count: 344 10*3/uL (ref 150–400)
RBC: 2.96 MIL/uL — ABNORMAL LOW (ref 3.87–5.11)
RDW: 18.6 % — ABNORMAL HIGH (ref 11.5–15.5)
WBC Count: 8.6 10*3/uL (ref 4.0–10.5)
nRBC: 0 % (ref 0.0–0.2)

## 2024-04-05 LAB — CMP (CANCER CENTER ONLY)
ALT: 28 U/L (ref 0–44)
AST: 30 U/L (ref 15–41)
Albumin: 4.4 g/dL (ref 3.5–5.0)
Alkaline Phosphatase: 53 U/L (ref 38–126)
Anion gap: 5 (ref 5–15)
BUN: 21 mg/dL (ref 8–23)
CO2: 29 mmol/L (ref 22–32)
Calcium: 9.1 mg/dL (ref 8.9–10.3)
Chloride: 100 mmol/L (ref 98–111)
Creatinine: 0.89 mg/dL (ref 0.44–1.00)
GFR, Estimated: >60 mL/min (ref >60)
Glucose, Bld: 100 mg/dL — ABNORMAL HIGH (ref 70–99)
Potassium: 4.4 mmol/L (ref 3.5–5.1)
Sodium: 134 mmol/L — ABNORMAL LOW (ref 135–145)
Total Bilirubin: 0.4 mg/dL (ref 0.0–1.2)
Total Protein: 6.7 g/dL (ref 6.5–8.1)

## 2024-04-11 ENCOUNTER — Telehealth: Payer: Self-pay | Admitting: *Deleted

## 2024-04-11 NOTE — Telephone Encounter (Signed)
 Contacted patient per MD request with message below. Patient verbalized understanding of results. --- Message ----- From: Ander Bame, MD  Please let Ms. Mullinix know that her platelets are on target but her hemoglobin has dropped to 10.2.  I would like to try and keep her hemoglobin above 10.  In the event that the hemoglobin were to drop we would need to change the dosage of her medication or consider changing the medication altogether.  Will be checking her labs again in early May.  If she were to have any issues with worsening fatigue, shortness of breath, or lightheadedness/dizziness please have her give us  a call so that we can check her labs promptly  After verbalizing understanding of results information, she then described following symptoms.  She said she's been short of breath and had palpitations occasionally over past few days. She states it resolves on its own.  She denies chest pain or acute shortness of breath.  She states she's had concerning new symptoms for past few days: Multiple small round spots developed on lower legs, looks like there is blood underneath. They are not bruises. She states it is similar to a rash in appearance, but without itch, pain and heat from spots. States also has a few spots on arms.   Has had blood in her ear - said she felt something 2 days ago in her ear and touched it with a Qtip. Qtip came out of ear with blood on it.  Informed her that this information will be given to Dr. Rosaline Coma and his support staff.   Advised her to seek emergent care for acute shortness of breath and/or chest pain and/or increase in bleeding or spots.  She verbalized understanding of information and direction.

## 2024-05-03 ENCOUNTER — Inpatient Hospital Stay: Attending: Hematology and Oncology

## 2024-05-03 DIAGNOSIS — Z7982 Long term (current) use of aspirin: Secondary | ICD-10-CM | POA: Insufficient documentation

## 2024-05-03 DIAGNOSIS — D473 Essential (hemorrhagic) thrombocythemia: Secondary | ICD-10-CM | POA: Insufficient documentation

## 2024-05-03 LAB — CBC WITH DIFFERENTIAL (CANCER CENTER ONLY)
Abs Immature Granulocytes: 0.06 10*3/uL (ref 0.00–0.07)
Basophils Absolute: 0.1 10*3/uL (ref 0.0–0.1)
Basophils Relative: 2 %
Eosinophils Absolute: 0 10*3/uL (ref 0.0–0.5)
Eosinophils Relative: 1 %
HCT: 31.3 % — ABNORMAL LOW (ref 36.0–46.0)
Hemoglobin: 10.5 g/dL — ABNORMAL LOW (ref 12.0–15.0)
Immature Granulocytes: 1 %
Lymphocytes Relative: 20 %
Lymphs Abs: 1.3 10*3/uL (ref 0.7–4.0)
MCH: 34.9 pg — ABNORMAL HIGH (ref 26.0–34.0)
MCHC: 33.5 g/dL (ref 30.0–36.0)
MCV: 104 fL — ABNORMAL HIGH (ref 80.0–100.0)
Monocytes Absolute: 0.9 10*3/uL (ref 0.1–1.0)
Monocytes Relative: 14 %
Neutro Abs: 4.1 10*3/uL (ref 1.7–7.7)
Neutrophils Relative %: 62 %
Platelet Count: 408 10*3/uL — ABNORMAL HIGH (ref 150–400)
RBC: 3.01 MIL/uL — ABNORMAL LOW (ref 3.87–5.11)
RDW: 16.6 % — ABNORMAL HIGH (ref 11.5–15.5)
WBC Count: 6.5 10*3/uL (ref 4.0–10.5)
nRBC: 0 % (ref 0.0–0.2)

## 2024-05-03 LAB — CMP (CANCER CENTER ONLY)
ALT: 19 U/L (ref 0–44)
AST: 24 U/L (ref 15–41)
Albumin: 4.4 g/dL (ref 3.5–5.0)
Alkaline Phosphatase: 59 U/L (ref 38–126)
Anion gap: 7 (ref 5–15)
BUN: 21 mg/dL (ref 8–23)
CO2: 30 mmol/L (ref 22–32)
Calcium: 9 mg/dL (ref 8.9–10.3)
Chloride: 98 mmol/L (ref 98–111)
Creatinine: 0.95 mg/dL (ref 0.44–1.00)
GFR, Estimated: 59 mL/min — ABNORMAL LOW (ref >60)
Glucose, Bld: 104 mg/dL — ABNORMAL HIGH (ref 70–99)
Potassium: 4.4 mmol/L (ref 3.5–5.1)
Sodium: 135 mmol/L (ref 135–145)
Total Bilirubin: 0.4 mg/dL (ref 0.0–1.2)
Total Protein: 6.7 g/dL (ref 6.5–8.1)

## 2024-05-04 ENCOUNTER — Telehealth: Payer: Self-pay | Admitting: *Deleted

## 2024-05-04 NOTE — Telephone Encounter (Signed)
-----   Message from Darilyn Edin sent at 05/03/2024  4:08 PM EDT ----- Anemia is persistent with slight increase in platelet count.  Please check with patient if she is still taking Hydrea  and if she has symptoms from anemia? ----- Message ----- From: Interface, [facility] [place] Sent: 05/03/2024   1:15 PM EDT To: Ander Bame, MD

## 2024-05-04 NOTE — Telephone Encounter (Signed)
 TCT patient regarding lab results. Spoke to her. Advised that she has persistent anemia along with a slight increase in her platelets.  Asked pt if she was feeling any side effects from anemia. She states she is feeling quite tired most days, some shortness of breath. She also states she periodically has red lumps on her legs that she calls clots. She also feels dizzy/light headed at times. She is taking the hydroxyurea  500 mg daily as instructed. Advised that I would let Wyline Hearing, PA-C know of the above.

## 2024-05-05 ENCOUNTER — Other Ambulatory Visit: Payer: Self-pay | Admitting: *Deleted

## 2024-05-05 ENCOUNTER — Telehealth: Payer: Self-pay | Admitting: *Deleted

## 2024-05-05 DIAGNOSIS — D473 Essential (hemorrhagic) thrombocythemia: Secondary | ICD-10-CM

## 2024-05-05 NOTE — Telephone Encounter (Signed)
 TCT patient regarding doppler study for her left leg. Spoke with her. Advised that the study will be done tomorrow @ 11:30am at the [facility] on [facility]. Karis states she knows where that is and she will be there

## 2024-05-05 NOTE — Telephone Encounter (Signed)
 Received call from pt. She states she has been doing things around the house-washing windows etc. She states she developed a pain in her left calf, midway down and has a swollen area that feels "spongey+ and is tender to the touch. Discussed with Wyline Hearing, PA_C. Doppler US  ordered for pt. Call made to have this scheduled. Awaiting call back.

## 2024-05-06 ENCOUNTER — Ambulatory Visit (HOSPITAL_BASED_OUTPATIENT_CLINIC_OR_DEPARTMENT_OTHER)
Admission: RE | Admit: 2024-05-06 | Discharge: 2024-05-06 | Discharge status: Home / Self Care | Source: Ambulatory Visit | Attending: Physician Assistant

## 2024-05-06 DIAGNOSIS — M79662 Pain in left lower leg: Secondary | ICD-10-CM | POA: Diagnosis not present

## 2024-05-06 DIAGNOSIS — D473 Essential (hemorrhagic) thrombocythemia: Secondary | ICD-10-CM | POA: Diagnosis not present

## 2024-05-06 DIAGNOSIS — M7989 Other specified soft tissue disorders: Secondary | ICD-10-CM | POA: Diagnosis not present

## 2024-05-08 ENCOUNTER — Telehealth: Payer: Self-pay | Admitting: *Deleted

## 2024-05-08 DIAGNOSIS — M79605 Pain in left leg: Secondary | ICD-10-CM | POA: Diagnosis not present

## 2024-05-08 DIAGNOSIS — S86112A Strain of other muscle(s) and tendon(s) of posterior muscle group at lower leg level, left leg, initial encounter: Secondary | ICD-10-CM | POA: Diagnosis not present

## 2024-05-08 DIAGNOSIS — S8012XA Contusion of left lower leg, initial encounter: Secondary | ICD-10-CM | POA: Diagnosis not present

## 2024-05-08 NOTE — Telephone Encounter (Signed)
 Received call from pt. She had the doppler study done of her left LE. No blood clot but there is a fluid collection along her calf muscle.. she asked what should she do at this point.  Advised to call her PCP and then get an Orthopedic referral. Pt thought that would be the case. She will call this morning. She states her leg is very painful.

## 2024-05-10 DIAGNOSIS — S86112A Strain of other muscle(s) and tendon(s) of posterior muscle group at lower leg level, left leg, initial encounter: Secondary | ICD-10-CM | POA: Diagnosis not present

## 2024-05-21 ENCOUNTER — Other Ambulatory Visit: Payer: Self-pay | Admitting: Hematology and Oncology

## 2024-05-24 DIAGNOSIS — S86112A Strain of other muscle(s) and tendon(s) of posterior muscle group at lower leg level, left leg, initial encounter: Secondary | ICD-10-CM | POA: Diagnosis not present

## 2024-05-26 DIAGNOSIS — M79605 Pain in left leg: Secondary | ICD-10-CM | POA: Diagnosis not present

## 2024-05-26 DIAGNOSIS — R29898 Other symptoms and signs involving the musculoskeletal system: Secondary | ICD-10-CM | POA: Diagnosis not present

## 2024-05-31 ENCOUNTER — Other Ambulatory Visit (HOSPITAL_COMMUNITY): Payer: Self-pay

## 2024-05-31 ENCOUNTER — Inpatient Hospital Stay: Admitting: Hematology and Oncology

## 2024-05-31 ENCOUNTER — Inpatient Hospital Stay: Attending: Hematology and Oncology

## 2024-05-31 ENCOUNTER — Other Ambulatory Visit (HOSPITAL_BASED_OUTPATIENT_CLINIC_OR_DEPARTMENT_OTHER): Payer: Self-pay

## 2024-05-31 VITALS — BP 148/61 | HR 86 | Temp 98.4°F | Resp 14 | Wt 184.5 lb

## 2024-05-31 DIAGNOSIS — D75839 Thrombocytosis, unspecified: Secondary | ICD-10-CM | POA: Diagnosis not present

## 2024-05-31 DIAGNOSIS — D473 Essential (hemorrhagic) thrombocythemia: Secondary | ICD-10-CM | POA: Diagnosis not present

## 2024-05-31 DIAGNOSIS — Z79899 Other long term (current) drug therapy: Secondary | ICD-10-CM | POA: Insufficient documentation

## 2024-05-31 DIAGNOSIS — Z7982 Long term (current) use of aspirin: Secondary | ICD-10-CM | POA: Insufficient documentation

## 2024-05-31 DIAGNOSIS — Z7964 Long term (current) use of myelosuppressive agent: Secondary | ICD-10-CM | POA: Diagnosis not present

## 2024-05-31 LAB — CMP (CANCER CENTER ONLY)
ALT: 16 U/L (ref 0–44)
AST: 20 U/L (ref 15–41)
Albumin: 4.4 g/dL (ref 3.5–5.0)
Alkaline Phosphatase: 61 U/L (ref 38–126)
Anion gap: 6 (ref 5–15)
BUN: 19 mg/dL (ref 8–23)
CO2: 30 mmol/L (ref 22–32)
Calcium: 9.4 mg/dL (ref 8.9–10.3)
Chloride: 98 mmol/L (ref 98–111)
Creatinine: 0.9 mg/dL (ref 0.44–1.00)
GFR, Estimated: >60 mL/min (ref >60)
Glucose, Bld: 97 mg/dL (ref 70–99)
Potassium: 4.4 mmol/L (ref 3.5–5.1)
Sodium: 134 mmol/L — ABNORMAL LOW (ref 135–145)
Total Bilirubin: 0.4 mg/dL (ref 0.0–1.2)
Total Protein: 7.2 g/dL (ref 6.5–8.1)

## 2024-05-31 LAB — CBC WITH DIFFERENTIAL (CANCER CENTER ONLY)
Abs Immature Granulocytes: 0.07 10*3/uL (ref 0.00–0.07)
Basophils Absolute: 0.2 10*3/uL — ABNORMAL HIGH (ref 0.0–0.1)
Basophils Relative: 2 %
Eosinophils Absolute: 0 10*3/uL (ref 0.0–0.5)
Eosinophils Relative: 0 %
HCT: 29.8 % — ABNORMAL LOW (ref 36.0–46.0)
Hemoglobin: 10 g/dL — ABNORMAL LOW (ref 12.0–15.0)
Immature Granulocytes: 1 %
Lymphocytes Relative: 14 %
Lymphs Abs: 1.4 10*3/uL (ref 0.7–4.0)
MCH: 35.3 pg — ABNORMAL HIGH (ref 26.0–34.0)
MCHC: 33.6 g/dL (ref 30.0–36.0)
MCV: 105.3 fL — ABNORMAL HIGH (ref 80.0–100.0)
Monocytes Absolute: 1.4 10*3/uL — ABNORMAL HIGH (ref 0.1–1.0)
Monocytes Relative: 15 %
Neutro Abs: 6.7 10*3/uL (ref 1.7–7.7)
Neutrophils Relative %: 68 %
Platelet Count: 480 10*3/uL — ABNORMAL HIGH (ref 150–400)
RBC: 2.83 MIL/uL — ABNORMAL LOW (ref 3.87–5.11)
RDW: 15.3 % (ref 11.5–15.5)
WBC Count: 9.8 10*3/uL (ref 4.0–10.5)
nRBC: 0 % (ref 0.0–0.2)

## 2024-05-31 MED ORDER — ANAGRELIDE HCL 1 MG PO CAPS
1.0000 mg | ORAL_CAPSULE | Freq: Two times a day (BID) | ORAL | 1 refills | Status: DC
  Filled 2024-05-31: qty 60, 30d supply, fill #0

## 2024-05-31 NOTE — Progress Notes (Signed)
 [facility] [phone]   [phone]  PROGRESS NOTE  Patient Care Team: Arva Lathe, MD as PCP - General (Internal Medicine)  Hematological/Oncological History # Essential thrombocytosis, CALR positive # Thrombocytosis 11/16/2023: WBC 7.2, Hgb 11.2, MCV 96.4, Plt 448 12/08/2023: establish care with Dr. Rosaline Coma.  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 05/31/2024: Transition to anagrelide  1 mg twice daily as hydroxyurea  is dropping her blood counts without controlling the platelets  Interval History:  Rhonda [name] 83 y.o. female with medical history significant for newly diagnosed essential thrombocytosis who presents for a follow up visit. The patient's last visit was on 03/08/2024. In the interim since the last visit she has continued on hydroxyurea  therapy.   On exam today Rhonda [name] reports she has been well overall in the interim since our last visit.  She notes that she is not having any trouble with bleeding, bruising, or dark stools.  She reports that she does get these "little red marks under the skin".  She is taking her aspirin  81 mg p.o. daily as prescribed.  She is also taking her hydroxyurea  faithfully.  She notes her energy levels have been good overall.  She rates them at about 7.5 out of 10.  Her appetite is also good and her weight is steadily increasing, up to 184 pounds.  She reports she also gets some cough and irritation in her throat.  She is doing her best to try to eat green leafy vegetables.  Otherwise she denies any fevers, chills, sweats, nausea, vomiting or diarrhea.  A full 10 point ROS is otherwise negative.   MEDICAL HISTORY:  Past Medical History:  Diagnosis Date   Anxiety    pt. may have panicy feeling upon awaking from anesth. - states she feels anxious at times  & has used Paxil  & xanax in the [past but  its been a very long time ago    Arthritis    rheumatoid & OA-hands, shoulders    Dyspnea    GERD (gastroesophageal reflux disease)    Heart murmur    Hypertension     SURGICAL HISTORY: Past Surgical History:  Procedure Laterality Date   APPENDECTOMY     laproscopic   BREAST BIOPSY Left 09/10/2023   MM LT BREAST BX W LOC DEV 1ST LESION IMAGE BX SPEC STEREO GUIDE 09/10/2023 [facility]   BREAST BIOPSY Left 09/10/2023   MM LT BREAST BX W LOC DEV EA AD LESION IMG BX SPEC STEREO GUIDE 09/10/2023 [facility]   COLONOSCOPY     HEMORROIDECTOMY     INSERTION OF MESH N/A 09/12/2018   Procedure: INSERTION OF MESH;  Surgeon: Caralyn Chandler, MD;  Location: [facility] OR;  Service: General;  Laterality: N/A;   LAPAROSCOPIC APPENDECTOMY N/A 01/06/2017   Procedure: LAPAROSCOPIC APPENDECTOMY;  Surgeon: Lillette Reid III, MD;  Location: [facility] OR;  Service: General;  Laterality: N/A;   TONSILLECTOMY     as a child   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   Procedure: HIP ARTHROPLASTY ANTERIOR APPROACH;  Surgeon: Jasmine Mesi, MD;  Location: [facility] OR;  Service: Orthopedics;  Laterality: Left;   VAGINAL DELIVERY     x2   VENTRAL HERNIA REPAIR N/A 09/12/2018   Procedure: LAPAROSCOPIC VENTRAL HERNIA REPAIR WITH MESH;  Surgeon: Caralyn Chandler, MD;  Location: [facility] OR;  Service: General;  Laterality: N/A;   VIDEO BRONCHOSCOPY Bilateral 11/07/2019  Procedure: VIDEO BRONCHOSCOPY WITH FLUORO;  Surgeon: Wilder Handy, MD;  Location: [facility] ENDOSCOPY;  Service: Endoscopy;  Laterality: Bilateral;    SOCIAL HISTORY: Social History   Socioeconomic History   Marital status: Married    Spouse name: Not on file   Number of children: Not on file   Years of education: Not on file   Highest education level: Not on file  Occupational History   Not on file  Tobacco Use   Smoking status: Never   Smokeless tobacco: Never  Substance and Sexual Activity   Alcohol  use: Yes    Comment: daily- wine & bourbon & ginger     Drug use: No   Sexual activity: Not on file  Other Topics Concern   Not on file  Social History Narrative   Not on file   Social Drivers of Health   Financial Resource Strain: Not on file  Food Insecurity: Not on file  Transportation Needs: Not on file  Physical Activity: Not on file  Stress: Not on file  Social Connections: Not on file  Intimate Partner Violence: Not on file    FAMILY HISTORY: Family History  Problem Relation Age of Onset   Breast cancer Daughter    Breast cancer Maternal Grandfather     ALLERGIES:  is allergic to aleve [naproxen].  MEDICATIONS:  Current Outpatient Medications  Medication Sig Dispense Refill   anagrelide  (AGRYLIN) 1 MG capsule Take 1 capsule (1 mg total) by mouth 2 (two) times daily. 60 capsule 1   acetaminophen  (TYLENOL ) 650 MG CR tablet Take 1,300 mg by mouth every 8 (eight) hours as needed for pain.     amLODipine  (NORVASC ) 5 MG tablet Take 5 mg by mouth daily.     Carboxymethylcellulose Sodium (REFRESH LIQUIGEL) 1 % GEL Place 1 drop into both eyes at bedtime.     Cholecalciferol (VITAMIN D) 2000 units CAPS Take 2,000 Units by mouth daily.      clobetasol cream (TEMOVATE) 0.05 % Apply 1 application topically 2 (two) times daily as needed (irritation).     diclofenac Sodium (VOLTAREN) 1 % GEL Apply 1 application topically 2 (two) times daily as needed (arthritis pain).     estradiol (ESTRACE) 0.1 MG/GM vaginal cream Place 1 Applicatorful vaginally daily as needed (for vaginal discomfort/dryness).      folic acid  (FOLVITE ) 800 MCG tablet Take 800 mcg by mouth daily.      Glucosamine-Chondroitin (MOVE FREE PO) Take 1 capsule by mouth 2 (two) times daily.     hydroxychloroquine  (PLAQUENIL ) 200 MG tablet TAKE 1 TABLET BY MOUTH TWICE  DAILY 180 tablet 3   hydroxyurea  (HYDREA ) 500 MG capsule Take 1 capsule (500 mg total) by mouth daily. May take with food to minimize GI side effects. 90 capsule 1   levothyroxine  (SYNTHROID , LEVOTHROID) 25 MCG  tablet Take 25 mcg by mouth daily before breakfast.      methotrexate  (RHEUMATREX) 2.5 MG tablet Take 12.5 mg by mouth every Friday.      Multiple Minerals-Vitamins (CALCIUM-MAGNESIUM-ZINC-D3 PO) Take 1 tablet by mouth 2 (two) times daily.      Multiple Vitamin (MULTIVITAMIN WITH MINERALS) TABS tablet Take 1 tablet by mouth daily.     olmesartan (BENICAR) 20 MG tablet Take 20 mg by mouth daily.      omeprazole (PRILOSEC) 40 MG capsule Take 40 mg by mouth daily.     PARoxetine  (PAXIL ) 20 MG tablet Take 20 mg by mouth daily.     polyethylene glycol (MIRALAX /  GLYCOLAX) packet Take 17 g by mouth daily.      Polyvinyl Alcohol -Povidone PF (REFRESH) 1.4-0.6 % SOLN Place 1 drop into both eyes daily.     Probiotic Product (PROBIOTIC DAILY PO) Take 1 capsule by mouth daily.     simvastatin  (ZOCOR ) 40 MG tablet Take 40 mg by mouth at bedtime.      TURMERIC PO Take 2 capsules by mouth daily.      No current facility-administered medications for this visit.    REVIEW OF SYSTEMS:   Constitutional: ( - ) fevers, ( - )  chills , ( - ) night sweats Eyes: ( - ) blurriness of vision, ( - ) double vision, ( - ) watery eyes Ears, nose, mouth, throat, and face: ( - ) mucositis, ( - ) sore throat Respiratory: ( - ) cough, ( - ) dyspnea, ( - ) wheezes Cardiovascular: ( - ) palpitation, ( - ) chest discomfort, ( - ) lower extremity swelling Gastrointestinal:  ( - ) nausea, ( - ) heartburn, ( - ) change in bowel habits Skin: ( - ) abnormal skin rashes Lymphatics: ( - ) new lymphadenopathy, ( - ) easy bruising Neurological: ( - ) numbness, ( - ) tingling, ( - ) new weaknesses Behavioral/Psych: ( - ) mood change, ( - ) new changes  All other systems were reviewed with the patient and are negative.  PHYSICAL EXAMINATION:  Vitals:   05/31/24 1439  BP: (!) 148/61  Pulse: 86  Resp: 14  Temp: 98.4 F (36.9 C)  SpO2: 99%    Filed Weights   05/31/24 1439  Weight: 184 lb 8 oz (83.7 kg)     GENERAL:  Well-appearing elderly Caucasian female, alert, no distress and comfortable SKIN: skin color, texture, turgor are normal, no rashes or significant lesions EYES: conjunctiva are pink and non-injected, sclera clear LUNGS: clear to auscultation and percussion with normal breathing effort HEART: regular rate & rhythm and no murmurs and no lower extremity edema Musculoskeletal: no cyanosis of digits and no clubbing  PSYCH: alert & oriented x 3, fluent speech NEURO: no focal motor/sensory deficits  LABORATORY DATA:  I have reviewed the data as listed    Latest Ref Rng & Units 05/31/2024    2:20 PM 05/03/2024    1:04 PM 04/05/2024    1:45 PM  CBC  WBC 4.0 - 10.5 K/uL 9.8  6.5  8.6   Hemoglobin 12.0 - 15.0 g/dL 40.9  81.1  91.4   Hematocrit 36.0 - 46.0 % 29.8  31.3  30.5   Platelets 150 - 400 K/uL 480  408  344        Latest Ref Rng & Units 05/31/2024    2:20 PM 05/03/2024    1:04 PM 04/05/2024    1:45 PM  CMP  Glucose 70 - 99 mg/dL 97  782  956   BUN 8 - 23 mg/dL 19  21  21    Creatinine 0.44 - 1.00 mg/dL 2.13  0.86  5.78   Sodium 135 - 145 mmol/L 134  135  134   Potassium 3.5 - 5.1 mmol/L 4.4  4.4  4.4   Chloride 98 - 111 mmol/L 98  98  100   CO2 22 - 32 mmol/L 30  30  29    Calcium 8.9 - 10.3 mg/dL 9.4  9.0  9.1   Total Protein 6.5 - 8.1 g/dL 7.2  6.7  6.7   Total Bilirubin 0.0 - 1.2 mg/dL 0.4  0.4  0.4   Alkaline Phos 38 - 126 U/L 61  59  53   AST 15 - 41 U/L 20  24  30    ALT 0 - 44 U/L 16  19  28       RADIOGRAPHIC STUDIES: I have personally reviewed the radiological images as listed and agreed with the findings in the report. US  Venous Img Lower Unilateral Left (DVT) Result Date: 05/06/2024 CLINICAL DATA:  Left calf pain, swelling and bruising EXAM: LEFT LOWER EXTREMITY VENOUS DOPPLER ULTRASOUND TECHNIQUE: Gray-scale sonography with compression, as well as color and duplex ultrasound, were performed to evaluate the deep venous system(s) from the level of the common femoral vein through  the popliteal and proximal calf veins. COMPARISON:  None Available. FINDINGS: VENOUS Normal compressibility of the common femoral, superficial femoral, and popliteal veins, as well as the visualized calf veins. Visualized portions of profunda femoral vein and great saphenous vein unremarkable. No filling defects to suggest DVT on grayscale or color Doppler imaging. Doppler waveforms show normal direction of venous flow, normal respiratory plasticity and response to augmentation. Limited views of the contralateral common femoral vein are unremarkable. OTHER Complex fluid collection in the superior and medial aspect of the mid calf measures 5.0 x 12.7 x 2.0 cm. No evidence of internal vascularity on color Doppler imaging. Limitations: none IMPRESSION: 1. No evidence of DVT. 2. Positive for an elongated complex fluid collection extending longitudinally along the medial gastrocnemius muscle. Differential considerations include hematoma (favored) or abscess (less likely). Patient may have experienced a partial tear of the gastrocnemius muscle with associated hemorrhage. Electronically Signed   By: Fernando Hoyer M.D.   On: 05/06/2024 12:35     ASSESSMENT & PLAN Rhonda [name] 83 y.o. female with medical history significant for newly diagnosed essential thrombocytosis who presents for a follow up visit.  # Essential thrombocytosis, CALR Positive --Diagnosis confirmed by the presence of a CALR mutation as well as bone marrow biopsy showing abnormal megakaryocytes. --Will plan to proceed with hydroxyurea  500 mg p.o. daily as well as aspirin  81 mg p.o. daily for thromboprophylaxis until she is able to acquire her anagrelide .  When she acquires anagrelide  begin anagrelide  1 mg p.o. twice daily. -- Will assure CBC, CMP at each visit. -- Target platelets between 150 and 400.  Will attempt to minimalize drop in white blood cell or hemoglobin.  Unfortunately this has been unsuccessful with hydroxyurea  and I  therefore transitioning to anagrelide  -- labs today show white blood cell 9.8, hemoglobin 10.0, MCV 105.3, platelets 480 -- Return to clinic in 1 week in 2 weeks for labs in 4 weeks for labs and clinic visit.  Orders Placed This Encounter  Procedures   CBC with Differential (Cancer Center Only)    Standing Status:   Future    Expiration Date:   06/04/2025   CMP (Cancer Center only)    Standing Status:   Future    Expiration Date:   06/04/2025   Ferritin    Standing Status:   Future    Expiration Date:   06/04/2025   Iron and Iron Binding Capacity ([facility] only)    Standing Status:   Future    Expiration Date:   06/04/2025   Retic Panel    Standing Status:   Future    Expiration Date:   06/04/2025    All questions were answered. The patient knows to call the clinic with any problems, questions or concerns.  A total of more than  30 minutes were spent on this encounter with face-to-face time and non-face-to-face time, including preparing to see the patient, ordering tests and/or medications, counseling the patient and coordination of care as outlined above.   Rogerio Clay, MD Department of Hematology/Oncology [facility] Phone: [phone] Pager: [phone] Email: [email or web address]  06/04/2024 1:30 PM

## 2024-06-01 ENCOUNTER — Other Ambulatory Visit (HOSPITAL_COMMUNITY): Payer: Self-pay

## 2024-06-01 ENCOUNTER — Telehealth: Payer: Self-pay | Admitting: *Deleted

## 2024-06-01 ENCOUNTER — Telehealth: Payer: Self-pay | Admitting: Pharmacy Technician

## 2024-06-01 DIAGNOSIS — R29898 Other symptoms and signs involving the musculoskeletal system: Secondary | ICD-10-CM | POA: Diagnosis not present

## 2024-06-01 DIAGNOSIS — M79605 Pain in left leg: Secondary | ICD-10-CM | POA: Diagnosis not present

## 2024-06-01 NOTE — Telephone Encounter (Signed)
 TCT patient regarding grant hat has been made available for her for the new medication-Anagrelide. Spoke with her. Advised that our Oral Chemo pharmacy department was able to obtain a grant for her for this medication and she will have a $0 copay. Pt very grateful for this. Advised that they can pick up this medication tomorrow from [facility] out patient pharmacy.

## 2024-06-01 NOTE — Telephone Encounter (Addendum)
 Oral Oncology Patient Advocate Encounter   Was successful in securing patient a $7000 grant from [employer] to provide copayment coverage for anagrelide (Agrylin).  This will keep the out of pocket expense at $0.    The billing information is as follows and has been shared with [facility].   Member ID: [id number] Group ID: [facility] RxBin: [id number] PCN: [id number] Dates of Eligibility: 06/01/24 through 06/01/25  Fund name:  Myeloproliferative Neoplasms  Rhonda [name], CPhT Specialty Pharmacy Patient Advocate Phone: [phone] Fax: [phone]

## 2024-06-02 ENCOUNTER — Other Ambulatory Visit (HOSPITAL_COMMUNITY): Payer: Self-pay

## 2024-06-06 DIAGNOSIS — M79605 Pain in left leg: Secondary | ICD-10-CM | POA: Diagnosis not present

## 2024-06-06 DIAGNOSIS — R29898 Other symptoms and signs involving the musculoskeletal system: Secondary | ICD-10-CM | POA: Diagnosis not present

## 2024-06-07 ENCOUNTER — Inpatient Hospital Stay

## 2024-06-07 DIAGNOSIS — D473 Essential (hemorrhagic) thrombocythemia: Secondary | ICD-10-CM

## 2024-06-07 DIAGNOSIS — Z7964 Long term (current) use of myelosuppressive agent: Secondary | ICD-10-CM | POA: Diagnosis not present

## 2024-06-07 DIAGNOSIS — Z79899 Other long term (current) drug therapy: Secondary | ICD-10-CM | POA: Diagnosis not present

## 2024-06-07 DIAGNOSIS — Z7982 Long term (current) use of aspirin: Secondary | ICD-10-CM | POA: Diagnosis not present

## 2024-06-07 DIAGNOSIS — D75839 Thrombocytosis, unspecified: Secondary | ICD-10-CM | POA: Diagnosis not present

## 2024-06-07 LAB — CMP (CANCER CENTER ONLY)
ALT: 16 U/L (ref 0–44)
AST: 22 U/L (ref 15–41)
Albumin: 4.3 g/dL (ref 3.5–5.0)
Alkaline Phosphatase: 63 U/L (ref 38–126)
Anion gap: 6 (ref 5–15)
BUN: 13 mg/dL (ref 8–23)
CO2: 26 mmol/L (ref 22–32)
Calcium: 9.1 mg/dL (ref 8.9–10.3)
Chloride: 98 mmol/L (ref 98–111)
Creatinine: 0.84 mg/dL (ref 0.44–1.00)
GFR, Estimated: >60 mL/min (ref >60)
Glucose, Bld: 102 mg/dL — ABNORMAL HIGH (ref 70–99)
Potassium: 4.2 mmol/L (ref 3.5–5.1)
Sodium: 130 mmol/L — ABNORMAL LOW (ref 135–145)
Total Bilirubin: 0.5 mg/dL (ref 0.0–1.2)
Total Protein: 6.8 g/dL (ref 6.5–8.1)

## 2024-06-07 LAB — RETIC PANEL
Immature Retic Fract: 11.1 % (ref 2.3–15.9)
RBC.: 2.77 MIL/uL — ABNORMAL LOW (ref 3.87–5.11)
Retic Count, Absolute: 38.5 10*3/uL (ref 19.0–186.0)
Retic Ct Pct: 1.4 % (ref 0.4–3.1)
Reticulocyte Hemoglobin: 37.5 pg (ref >27.9)

## 2024-06-07 LAB — CBC WITH DIFFERENTIAL (CANCER CENTER ONLY)
Abs Immature Granulocytes: 0.03 10*3/uL (ref 0.00–0.07)
Basophils Absolute: 0.2 10*3/uL — ABNORMAL HIGH (ref 0.0–0.1)
Basophils Relative: 2 %
Eosinophils Absolute: 0 10*3/uL (ref 0.0–0.5)
Eosinophils Relative: 0 %
HCT: 28.8 % — ABNORMAL LOW (ref 36.0–46.0)
Hemoglobin: 9.7 g/dL — ABNORMAL LOW (ref 12.0–15.0)
Immature Granulocytes: 0 %
Lymphocytes Relative: 19 %
Lymphs Abs: 1.5 10*3/uL (ref 0.7–4.0)
MCH: 34.6 pg — ABNORMAL HIGH (ref 26.0–34.0)
MCHC: 33.7 g/dL (ref 30.0–36.0)
MCV: 102.9 fL — ABNORMAL HIGH (ref 80.0–100.0)
Monocytes Absolute: 1.1 10*3/uL — ABNORMAL HIGH (ref 0.1–1.0)
Monocytes Relative: 15 %
Neutro Abs: 4.9 10*3/uL (ref 1.7–7.7)
Neutrophils Relative %: 64 %
Platelet Count: 374 10*3/uL (ref 150–400)
RBC: 2.8 MIL/uL — ABNORMAL LOW (ref 3.87–5.11)
RDW: 14.8 % (ref 11.5–15.5)
WBC Count: 7.7 10*3/uL (ref 4.0–10.5)
nRBC: 0 % (ref 0.0–0.2)

## 2024-06-07 LAB — IRON AND IRON BINDING CAPACITY (CC-WL,HP ONLY)
Iron: 59 ug/dL (ref 28–170)
Saturation Ratios: 19 % (ref 10.4–31.8)
TIBC: 311 ug/dL (ref 250–450)
UIBC: 252 ug/dL

## 2024-06-08 LAB — FERRITIN: Ferritin: 141 ng/mL (ref 11–307)

## 2024-06-13 DIAGNOSIS — M79605 Pain in left leg: Secondary | ICD-10-CM | POA: Diagnosis not present

## 2024-06-13 DIAGNOSIS — R29898 Other symptoms and signs involving the musculoskeletal system: Secondary | ICD-10-CM | POA: Diagnosis not present

## 2024-06-14 ENCOUNTER — Inpatient Hospital Stay

## 2024-06-14 ENCOUNTER — Ambulatory Visit: Payer: Self-pay | Admitting: *Deleted

## 2024-06-14 DIAGNOSIS — Z7964 Long term (current) use of myelosuppressive agent: Secondary | ICD-10-CM | POA: Diagnosis not present

## 2024-06-14 DIAGNOSIS — D473 Essential (hemorrhagic) thrombocythemia: Secondary | ICD-10-CM | POA: Diagnosis not present

## 2024-06-14 DIAGNOSIS — Z79899 Other long term (current) drug therapy: Secondary | ICD-10-CM | POA: Diagnosis not present

## 2024-06-14 DIAGNOSIS — D75839 Thrombocytosis, unspecified: Secondary | ICD-10-CM | POA: Diagnosis not present

## 2024-06-14 DIAGNOSIS — D649 Anemia, unspecified: Secondary | ICD-10-CM | POA: Diagnosis not present

## 2024-06-14 DIAGNOSIS — Z7982 Long term (current) use of aspirin: Secondary | ICD-10-CM | POA: Diagnosis not present

## 2024-06-14 DIAGNOSIS — M7989 Other specified soft tissue disorders: Secondary | ICD-10-CM | POA: Diagnosis not present

## 2024-06-14 LAB — CBC WITH DIFFERENTIAL (CANCER CENTER ONLY)
Abs Immature Granulocytes: 0.05 10*3/uL (ref 0.00–0.07)
Basophils Absolute: 0.1 10*3/uL (ref 0.0–0.1)
Basophils Relative: 2 %
Eosinophils Absolute: 0 10*3/uL (ref 0.0–0.5)
Eosinophils Relative: 0 %
HCT: 27.4 % — ABNORMAL LOW (ref 36.0–46.0)
Hemoglobin: 9.5 g/dL — ABNORMAL LOW (ref 12.0–15.0)
Immature Granulocytes: 1 %
Lymphocytes Relative: 14 %
Lymphs Abs: 1.3 10*3/uL (ref 0.7–4.0)
MCH: 35.3 pg — ABNORMAL HIGH (ref 26.0–34.0)
MCHC: 34.7 g/dL (ref 30.0–36.0)
MCV: 101.9 fL — ABNORMAL HIGH (ref 80.0–100.0)
Monocytes Absolute: 1.3 10*3/uL — ABNORMAL HIGH (ref 0.1–1.0)
Monocytes Relative: 15 %
Neutro Abs: 6.1 10*3/uL (ref 1.7–7.7)
Neutrophils Relative %: 68 %
Platelet Count: 121 10*3/uL — ABNORMAL LOW (ref 150–400)
RBC: 2.69 MIL/uL — ABNORMAL LOW (ref 3.87–5.11)
RDW: 14.6 % (ref 11.5–15.5)
WBC Count: 8.9 10*3/uL (ref 4.0–10.5)
nRBC: 0 % (ref 0.0–0.2)

## 2024-06-14 LAB — CMP (CANCER CENTER ONLY)
ALT: 12 U/L (ref 0–44)
AST: 20 U/L (ref 15–41)
Albumin: 4 g/dL (ref 3.5–5.0)
Alkaline Phosphatase: 60 U/L (ref 38–126)
Anion gap: 7 (ref 5–15)
BUN: 18 mg/dL (ref 8–23)
CO2: 27 mmol/L (ref 22–32)
Calcium: 9 mg/dL (ref 8.9–10.3)
Chloride: 99 mmol/L (ref 98–111)
Creatinine: 0.86 mg/dL (ref 0.44–1.00)
GFR, Estimated: >60 mL/min (ref >60)
Glucose, Bld: 110 mg/dL — ABNORMAL HIGH (ref 70–99)
Potassium: 4.3 mmol/L (ref 3.5–5.1)
Sodium: 133 mmol/L — ABNORMAL LOW (ref 135–145)
Total Bilirubin: 0.4 mg/dL (ref 0.0–1.2)
Total Protein: 6.6 g/dL (ref 6.5–8.1)

## 2024-06-14 NOTE — Telephone Encounter (Signed)
-----   Message from Rogerio Clay IV sent at 06/14/2024  2:29 PM EDT ----- Please let Mrs. Verstraete know that he anagrelide  was too effective and her Plt dropped to <150. Please have her hold the medication until her next lab check on 7/2. When we restart we will likely try  0.5 mg BID (please ask if she is able to cut the medication in half for when this happens)  ----- Message ----- From: Interface, Lab In [place] Sent: 06/14/2024   2:23 PM EDT To: John T Dorsey IV, MD

## 2024-06-14 NOTE — Telephone Encounter (Signed)
 TCT patient regarding recent lab results.  Spoke with her. Advised  that her anagrelide  was too effective and her Plt dropped to 121.advised to hold the medication until her next lab check on 7/2. When we restart we will likely try 0.5 mg BID   She said she could cut her anagrelide  tablets in half if needed. She c/o pretty severe fatigue. Advised that she is anemic from her Anagrelide  and that her HGB will gradually improve on a reduced dose. Pt voiced understanding.

## 2024-06-28 ENCOUNTER — Inpatient Hospital Stay: Attending: Hematology and Oncology | Admitting: Hematology and Oncology

## 2024-06-28 ENCOUNTER — Other Ambulatory Visit (HOSPITAL_COMMUNITY): Payer: Self-pay

## 2024-06-28 ENCOUNTER — Inpatient Hospital Stay

## 2024-06-28 VITALS — BP 138/57 | HR 85 | Temp 97.9°F | Resp 14 | Wt 179.0 lb

## 2024-06-28 DIAGNOSIS — D75839 Thrombocytosis, unspecified: Secondary | ICD-10-CM | POA: Diagnosis not present

## 2024-06-28 DIAGNOSIS — D473 Essential (hemorrhagic) thrombocythemia: Secondary | ICD-10-CM | POA: Diagnosis not present

## 2024-06-28 DIAGNOSIS — Z79899 Other long term (current) drug therapy: Secondary | ICD-10-CM | POA: Diagnosis not present

## 2024-06-28 LAB — CBC WITH DIFFERENTIAL (CANCER CENTER ONLY)
Abs Immature Granulocytes: 0.11 10*3/uL — ABNORMAL HIGH (ref 0.00–0.07)
Basophils Absolute: 0.2 10*3/uL — ABNORMAL HIGH (ref 0.0–0.1)
Basophils Relative: 2 %
Eosinophils Absolute: 0 10*3/uL (ref 0.0–0.5)
Eosinophils Relative: 0 %
HCT: 31.3 % — ABNORMAL LOW (ref 36.0–46.0)
Hemoglobin: 10.3 g/dL — ABNORMAL LOW (ref 12.0–15.0)
Immature Granulocytes: 1 %
Lymphocytes Relative: 15 %
Lymphs Abs: 1.4 10*3/uL (ref 0.7–4.0)
MCH: 34.1 pg — ABNORMAL HIGH (ref 26.0–34.0)
MCHC: 32.9 g/dL (ref 30.0–36.0)
MCV: 103.6 fL — ABNORMAL HIGH (ref 80.0–100.0)
Monocytes Absolute: 1.4 10*3/uL — ABNORMAL HIGH (ref 0.1–1.0)
Monocytes Relative: 15 %
Neutro Abs: 6.3 10*3/uL (ref 1.7–7.7)
Neutrophils Relative %: 67 %
Platelet Count: 513 10*3/uL — ABNORMAL HIGH (ref 150–400)
RBC: 3.02 MIL/uL — ABNORMAL LOW (ref 3.87–5.11)
RDW: 15 % (ref 11.5–15.5)
WBC Count: 9.4 10*3/uL (ref 4.0–10.5)
nRBC: 0 % (ref 0.0–0.2)

## 2024-06-28 LAB — CMP (CANCER CENTER ONLY)
ALT: 17 U/L (ref 0–44)
AST: 22 U/L (ref 15–41)
Albumin: 4.2 g/dL (ref 3.5–5.0)
Alkaline Phosphatase: 74 U/L (ref 38–126)
Anion gap: 7 (ref 5–15)
BUN: 15 mg/dL (ref 8–23)
CO2: 29 mmol/L (ref 22–32)
Calcium: 9.2 mg/dL (ref 8.9–10.3)
Chloride: 100 mmol/L (ref 98–111)
Creatinine: 0.91 mg/dL (ref 0.44–1.00)
GFR, Estimated: >60 mL/min (ref >60)
Glucose, Bld: 93 mg/dL (ref 70–99)
Potassium: 4.6 mmol/L (ref 3.5–5.1)
Sodium: 136 mmol/L (ref 135–145)
Total Bilirubin: 0.4 mg/dL (ref 0.0–1.2)
Total Protein: 6.9 g/dL (ref 6.5–8.1)

## 2024-06-28 MED ORDER — ANAGRELIDE HCL 0.5 MG PO CAPS
0.5000 mg | ORAL_CAPSULE | Freq: Two times a day (BID) | ORAL | 1 refills | Status: DC
  Filled 2024-06-28 (×2): qty 60, 30d supply, fill #0

## 2024-06-28 NOTE — Progress Notes (Signed)
 [facility] Telephone:([phone]) [phone]   [phone]  PROGRESS NOTE  Patient Care Team: Elliot Charm, MD as PCP - General (Internal Medicine)  Hematological/Oncological History # Essential thrombocytosis, CALR positive # Thrombocytosis 11/16/2023: WBC 7.2, Hgb 11.2, MCV 96.4, Plt 448 12/08/2023: establish care with Dr. Federico.  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 05/31/2024: Transition to anagrelide  1 mg twice daily as hydroxyurea  is dropping her blood counts without controlling the platelets  Interval History:  Rhonda [name] 83 y.o. female with medical history significant for newly diagnosed essential thrombocytosis who presents for a follow up visit. The patient's last visit was on 05/31/2024. In the interim since the last visit she has continued on hydroxyurea  therapy.   On exam today Rhonda [name] reports she has been feeling okay in the interim since her last visit.  She reports her energy levels are low and they are currently a 5 out of 10.  She reports she is not having any lightheadedness, dizziness, shortness of breath.  She denies any bleeding or bruising.  She is not having any dark bowel movements.  She is also not having any palpitations or chest pain.  She reports she has been eating well.  She notes that she has held the medication as we have requested.  She notes no other changes in her health in the interim since our last visit.  She has had no issues with infectious symptoms such as fevers, chills, sweats, nausea, vomiting, or diarrhea.  She denies any runny nose or sore throat.  A full 10 point ROS is otherwise negative.   MEDICAL HISTORY:  Past Medical History:  Diagnosis Date   Anxiety    pt. may have panicy feeling upon awaking from anesth. - states she feels anxious at times  & has used Paxil  & xanax in the [past but its been a very  long time ago    Arthritis    rheumatoid & OA-hands, shoulders    Dyspnea    GERD (gastroesophageal reflux disease)    Heart murmur    Hypertension     SURGICAL HISTORY: Past Surgical History:  Procedure Laterality Date   APPENDECTOMY     laproscopic   BREAST BIOPSY Left 09/10/2023   MM LT BREAST BX W LOC DEV 1ST LESION IMAGE BX SPEC STEREO GUIDE 09/10/2023 [facility]   BREAST BIOPSY Left 09/10/2023   MM LT BREAST BX W LOC DEV EA AD LESION IMG BX SPEC STEREO GUIDE 09/10/2023 [facility]   COLONOSCOPY     HEMORROIDECTOMY     INSERTION OF MESH N/A 09/12/2018   Procedure: INSERTION OF MESH;  Surgeon: Curvin Deward MOULD, MD;  Location: [facility] OR;  Service: General;  Laterality: N/A;   LAPAROSCOPIC APPENDECTOMY N/A 01/06/2017   Procedure: LAPAROSCOPIC APPENDECTOMY;  Surgeon: Deward Curvin III, MD;  Location: [facility] OR;  Service: General;  Laterality: N/A;   TONSILLECTOMY     as a child   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   Procedure: HIP ARTHROPLASTY ANTERIOR APPROACH;  Surgeon: Addie Cordella Hamilton, MD;  Location: [facility] OR;  Service: Orthopedics;  Laterality: Left;   VAGINAL DELIVERY     x2   VENTRAL HERNIA REPAIR N/A 09/12/2018   Procedure: LAPAROSCOPIC VENTRAL HERNIA REPAIR WITH MESH;  Surgeon: Curvin Deward MOULD, MD;  Location: [facility] OR;  Service: General;  Laterality: N/A;   VIDEO BRONCHOSCOPY Bilateral 11/07/2019   Procedure: VIDEO BRONCHOSCOPY  WITH FLUORO;  Surgeon: Shellia Oh, MD;  Location: [facility] ENDOSCOPY;  Service: Endoscopy;  Laterality: Bilateral;    SOCIAL HISTORY: Social History   Socioeconomic History   Marital status: Married    Spouse name: Not on file   Number of children: Not on file   Years of education: Not on file   Highest education level: Not on file  Occupational History   Not on file  Tobacco Use   Smoking status: Never   Smokeless tobacco: Never  Substance and Sexual Activity   Alcohol  use: Yes    Comment: daily- wine & bourbon & ginger    Drug use: No    Sexual activity: Not on file  Other Topics Concern   Not on file  Social History Narrative   Not on file   Social Drivers of Health   Financial Resource Strain: Not on file  Food Insecurity: Not on file  Transportation Needs: Not on file  Physical Activity: Not on file  Stress: Not on file  Social Connections: Not on file  Intimate Partner Violence: Not on file    FAMILY HISTORY: Family History  Problem Relation Age of Onset   Breast cancer Daughter    Breast cancer Maternal Grandfather     ALLERGIES:  is allergic to aleve [naproxen].  MEDICATIONS:  Current Outpatient Medications  Medication Sig Dispense Refill   anagrelide  (AGRYLIN) 0.5 MG capsule Take 1 capsule (0.5 mg total) by mouth 2 (two) times daily. 60 capsule 1   acetaminophen  (TYLENOL ) 650 MG CR tablet Take 1,300 mg by mouth every 8 (eight) hours as needed for pain.     amLODipine  (NORVASC ) 5 MG tablet Take 5 mg by mouth daily.     Carboxymethylcellulose Sodium (REFRESH LIQUIGEL) 1 % GEL Place 1 drop into both eyes at bedtime.     Cholecalciferol (VITAMIN D) 2000 units CAPS Take 2,000 Units by mouth daily.      clobetasol cream (TEMOVATE) 0.05 % Apply 1 application topically 2 (two) times daily as needed (irritation).     diclofenac Sodium (VOLTAREN) 1 % GEL Apply 1 application topically 2 (two) times daily as needed (arthritis pain).     estradiol (ESTRACE) 0.1 MG/GM vaginal cream Place 1 Applicatorful vaginally daily as needed (for vaginal discomfort/dryness).      folic acid  (FOLVITE ) 800 MCG tablet Take 800 mcg by mouth daily.      Glucosamine-Chondroitin (MOVE FREE PO) Take 1 capsule by mouth 2 (two) times daily.     hydroxychloroquine  (PLAQUENIL ) 200 MG tablet TAKE 1 TABLET BY MOUTH TWICE  DAILY 180 tablet 3   hydroxyurea  (HYDREA ) 500 MG capsule Take 1 capsule (500 mg total) by mouth daily. May take with food to minimize GI side effects. 90 capsule 1   levothyroxine  (SYNTHROID , LEVOTHROID) 25 MCG tablet Take  25 mcg by mouth daily before breakfast.      methotrexate  (RHEUMATREX) 2.5 MG tablet Take 12.5 mg by mouth every Friday.      Multiple Minerals-Vitamins (CALCIUM-MAGNESIUM-ZINC-D3 PO) Take 1 tablet by mouth 2 (two) times daily.      Multiple Vitamin (MULTIVITAMIN WITH MINERALS) TABS tablet Take 1 tablet by mouth daily.     olmesartan (BENICAR) 20 MG tablet Take 20 mg by mouth daily.      omeprazole (PRILOSEC) 40 MG capsule Take 40 mg by mouth daily.     PARoxetine  (PAXIL ) 20 MG tablet Take 20 mg by mouth daily.     polyethylene glycol (MIRALAX / GLYCOLAX) packet  Take 17 g by mouth daily.      Polyvinyl Alcohol -Povidone PF (REFRESH) 1.4-0.6 % SOLN Place 1 drop into both eyes daily.     Probiotic Product (PROBIOTIC DAILY PO) Take 1 capsule by mouth daily.     simvastatin  (ZOCOR ) 40 MG tablet Take 40 mg by mouth at bedtime.      TURMERIC PO Take 2 capsules by mouth daily.      No current facility-administered medications for this visit.    REVIEW OF SYSTEMS:   Constitutional: ( - ) fevers, ( - )  chills , ( - ) night sweats Eyes: ( - ) blurriness of vision, ( - ) double vision, ( - ) watery eyes Ears, nose, mouth, throat, and face: ( - ) mucositis, ( - ) sore throat Respiratory: ( - ) cough, ( - ) dyspnea, ( - ) wheezes Cardiovascular: ( - ) palpitation, ( - ) chest discomfort, ( - ) lower extremity swelling Gastrointestinal:  ( - ) nausea, ( - ) heartburn, ( - ) change in bowel habits Skin: ( - ) abnormal skin rashes Lymphatics: ( - ) new lymphadenopathy, ( - ) easy bruising Neurological: ( - ) numbness, ( - ) tingling, ( - ) new weaknesses Behavioral/Psych: ( - ) mood change, ( - ) new changes  All other systems were reviewed with the patient and are negative.  PHYSICAL EXAMINATION:  Vitals:   06/28/24 1457  BP: (!) 138/57  Pulse: 85  Resp: 14  Temp: 97.9 F (36.6 C)  SpO2: 98%     Filed Weights   06/28/24 1457  Weight: 179 lb (81.2 kg)      GENERAL: Well-appearing  elderly Caucasian female, alert, no distress and comfortable SKIN: skin color, texture, turgor are normal, no rashes or significant lesions EYES: conjunctiva are pink and non-injected, sclera clear LUNGS: clear to auscultation and percussion with normal breathing effort HEART: regular rate & rhythm and no murmurs and no lower extremity edema Musculoskeletal: no cyanosis of digits and no clubbing  PSYCH: alert & oriented x 3, fluent speech NEURO: no focal motor/sensory deficits  LABORATORY DATA:  I have reviewed the data as listed    Latest Ref Rng & Units 06/28/2024    2:36 PM 06/14/2024    2:10 PM 06/07/2024    3:22 PM  CBC  WBC 4.0 - 10.5 K/uL 9.4  8.9  7.7   Hemoglobin 12.0 - 15.0 g/dL 89.6  9.5  9.7   Hematocrit 36.0 - 46.0 % 31.3  27.4  28.8   Platelets 150 - 400 K/uL 513  121  374        Latest Ref Rng & Units 06/28/2024    2:36 PM 06/14/2024    2:10 PM 06/07/2024    3:22 PM  CMP  Glucose 70 - 99 mg/dL 93  889  897   BUN 8 - 23 mg/dL 15  18  13    Creatinine 0.44 - 1.00 mg/dL 9.08  9.13  9.15   Sodium 135 - 145 mmol/L 136  133  130   Potassium 3.5 - 5.1 mmol/L 4.6  4.3  4.2   Chloride 98 - 111 mmol/L 100  99  98   CO2 22 - 32 mmol/L 29  27  26    Calcium 8.9 - 10.3 mg/dL 9.2  9.0  9.1   Total Protein 6.5 - 8.1 g/dL 6.9  6.6  6.8   Total Bilirubin 0.0 - 1.2 mg/dL 0.4  0.4  0.5   Alkaline Phos 38 - 126 U/L 74  60  63   AST 15 - 41 U/L 22  20  22    ALT 0 - 44 U/L 17  12  16       RADIOGRAPHIC STUDIES: I have personally reviewed the radiological images as listed and agreed with the findings in the report. No results found.    ASSESSMENT & PLAN Rhonda [name] 83 y.o. female with medical history significant for newly diagnosed essential thrombocytosis who presents for a follow up visit.  # Essential thrombocytosis, CALR Positive --Diagnosis confirmed by the presence of a CALR mutation as well as bone marrow biopsy showing abnormal megakaryocytes. --Due to drop in  hemoglobin and platelets at anagrelide  1 mg twice daily will drop to 0.5 mg twice daily instead. -- Will assure CBC, CMP at each visit. -- Target platelets between 150 and 400.  Will attempt to minimalize drop in white blood cell or hemoglobin.  Unfortunately this has been unsuccessful with hydroxyurea  and I therefore transitioning to anagrelide  -- labs today show white blood cell 9.4, hemoglobin 10.3, MCV 103.6, platelets 513 -- Return to clinicin 2 weeks for labs in 4 weeks for labs and clinic visit.  No orders of the defined types were placed in this encounter.   All questions were answered. The patient knows to call the clinic with any problems, questions or concerns.  A total of more than 30 minutes were spent on this encounter with face-to-face time and non-face-to-face time, including preparing to see the patient, ordering tests and/or medications, counseling the patient and coordination of care as outlined above.   Norleen IVAR Kidney, MD Department of Hematology/Oncology [facility] Phone: [phone] Pager: [phone] Email: [email or web address]  07/02/2024 5:21 PM

## 2024-06-29 ENCOUNTER — Other Ambulatory Visit (HOSPITAL_COMMUNITY): Payer: Self-pay

## 2024-07-05 DIAGNOSIS — J301 Allergic rhinitis due to pollen: Secondary | ICD-10-CM | POA: Diagnosis not present

## 2024-07-05 DIAGNOSIS — U071 COVID-19: Secondary | ICD-10-CM | POA: Diagnosis not present

## 2024-07-05 DIAGNOSIS — I10 Essential (primary) hypertension: Secondary | ICD-10-CM | POA: Diagnosis not present

## 2024-07-05 DIAGNOSIS — D75839 Thrombocytosis, unspecified: Secondary | ICD-10-CM | POA: Diagnosis not present

## 2024-07-05 DIAGNOSIS — J209 Acute bronchitis, unspecified: Secondary | ICD-10-CM | POA: Diagnosis not present

## 2024-07-05 DIAGNOSIS — E785 Hyperlipidemia, unspecified: Secondary | ICD-10-CM | POA: Diagnosis not present

## 2024-07-10 ENCOUNTER — Inpatient Hospital Stay

## 2024-07-14 ENCOUNTER — Inpatient Hospital Stay

## 2024-07-14 DIAGNOSIS — Z79899 Other long term (current) drug therapy: Secondary | ICD-10-CM | POA: Diagnosis not present

## 2024-07-14 DIAGNOSIS — D473 Essential (hemorrhagic) thrombocythemia: Secondary | ICD-10-CM | POA: Diagnosis not present

## 2024-07-14 DIAGNOSIS — D75839 Thrombocytosis, unspecified: Secondary | ICD-10-CM | POA: Diagnosis not present

## 2024-07-14 LAB — CBC WITH DIFFERENTIAL (CANCER CENTER ONLY)
Abs Immature Granulocytes: 0.16 K/uL — ABNORMAL HIGH (ref 0.00–0.07)
Basophils Absolute: 0.2 K/uL — ABNORMAL HIGH (ref 0.0–0.1)
Basophils Relative: 2 %
Eosinophils Absolute: 0.1 K/uL (ref 0.0–0.5)
Eosinophils Relative: 1 %
HCT: 31 % — ABNORMAL LOW (ref 36.0–46.0)
Hemoglobin: 10.5 g/dL — ABNORMAL LOW (ref 12.0–15.0)
Immature Granulocytes: 2 %
Lymphocytes Relative: 15 %
Lymphs Abs: 1.5 K/uL (ref 0.7–4.0)
MCH: 33.4 pg (ref 26.0–34.0)
MCHC: 33.9 g/dL (ref 30.0–36.0)
MCV: 98.7 fL (ref 80.0–100.0)
Monocytes Absolute: 1.6 K/uL — ABNORMAL HIGH (ref 0.1–1.0)
Monocytes Relative: 16 %
Neutro Abs: 6.7 K/uL (ref 1.7–7.7)
Neutrophils Relative %: 64 %
Platelet Count: 326 K/uL (ref 150–400)
RBC: 3.14 MIL/uL — ABNORMAL LOW (ref 3.87–5.11)
RDW: 14.6 % (ref 11.5–15.5)
WBC Count: 10.2 K/uL (ref 4.0–10.5)
nRBC: 0 % (ref 0.0–0.2)

## 2024-07-14 LAB — CMP (CANCER CENTER ONLY)
ALT: 15 U/L (ref 0–44)
AST: 23 U/L (ref 15–41)
Albumin: 4.2 g/dL (ref 3.5–5.0)
Alkaline Phosphatase: 69 U/L (ref 38–126)
Anion gap: 4 — ABNORMAL LOW (ref 5–15)
BUN: 21 mg/dL (ref 8–23)
CO2: 28 mmol/L (ref 22–32)
Calcium: 9 mg/dL (ref 8.9–10.3)
Chloride: 98 mmol/L (ref 98–111)
Creatinine: 0.96 mg/dL (ref 0.44–1.00)
GFR, Estimated: 58 mL/min — ABNORMAL LOW (ref >60)
Glucose, Bld: 104 mg/dL — ABNORMAL HIGH (ref 70–99)
Potassium: 4.5 mmol/L (ref 3.5–5.1)
Sodium: 130 mmol/L — ABNORMAL LOW (ref 135–145)
Total Bilirubin: 0.4 mg/dL (ref 0.0–1.2)
Total Protein: 6.7 g/dL (ref 6.5–8.1)

## 2024-07-18 DIAGNOSIS — S86112A Strain of other muscle(s) and tendon(s) of posterior muscle group at lower leg level, left leg, initial encounter: Secondary | ICD-10-CM | POA: Diagnosis not present

## 2024-07-19 DIAGNOSIS — J40 Bronchitis, not specified as acute or chronic: Secondary | ICD-10-CM | POA: Diagnosis not present

## 2024-07-19 DIAGNOSIS — I10 Essential (primary) hypertension: Secondary | ICD-10-CM | POA: Diagnosis not present

## 2024-07-28 ENCOUNTER — Other Ambulatory Visit (HOSPITAL_COMMUNITY): Payer: Self-pay

## 2024-07-28 ENCOUNTER — Inpatient Hospital Stay: Attending: Hematology and Oncology

## 2024-07-28 ENCOUNTER — Other Ambulatory Visit: Payer: Self-pay

## 2024-07-28 ENCOUNTER — Inpatient Hospital Stay: Admitting: Hematology and Oncology

## 2024-07-28 ENCOUNTER — Other Ambulatory Visit (HOSPITAL_BASED_OUTPATIENT_CLINIC_OR_DEPARTMENT_OTHER): Payer: Self-pay

## 2024-07-28 VITALS — BP 139/76 | HR 80 | Temp 97.2°F | Resp 15 | Wt 180.4 lb

## 2024-07-28 DIAGNOSIS — Z79899 Other long term (current) drug therapy: Secondary | ICD-10-CM | POA: Diagnosis not present

## 2024-07-28 DIAGNOSIS — D75839 Thrombocytosis, unspecified: Secondary | ICD-10-CM | POA: Diagnosis not present

## 2024-07-28 DIAGNOSIS — Z803 Family history of malignant neoplasm of breast: Secondary | ICD-10-CM | POA: Diagnosis not present

## 2024-07-28 DIAGNOSIS — D473 Essential (hemorrhagic) thrombocythemia: Secondary | ICD-10-CM

## 2024-07-28 DIAGNOSIS — Z7964 Long term (current) use of myelosuppressive agent: Secondary | ICD-10-CM | POA: Insufficient documentation

## 2024-07-28 DIAGNOSIS — Z79890 Hormone replacement therapy (postmenopausal): Secondary | ICD-10-CM | POA: Diagnosis not present

## 2024-07-28 LAB — CMP (CANCER CENTER ONLY)
ALT: 15 U/L (ref 0–44)
AST: 22 U/L (ref 15–41)
Albumin: 4.2 g/dL (ref 3.5–5.0)
Alkaline Phosphatase: 66 U/L (ref 38–126)
Anion gap: 5 (ref 5–15)
BUN: 22 mg/dL (ref 8–23)
CO2: 27 mmol/L (ref 22–32)
Calcium: 8.9 mg/dL (ref 8.9–10.3)
Chloride: 103 mmol/L (ref 98–111)
Creatinine: 1.09 mg/dL — ABNORMAL HIGH (ref 0.44–1.00)
GFR, Estimated: 50 mL/min — ABNORMAL LOW (ref >60)
Glucose, Bld: 108 mg/dL — ABNORMAL HIGH (ref 70–99)
Potassium: 4.7 mmol/L (ref 3.5–5.1)
Sodium: 135 mmol/L (ref 135–145)
Total Bilirubin: 0.3 mg/dL (ref 0.0–1.2)
Total Protein: 6.7 g/dL (ref 6.5–8.1)

## 2024-07-28 LAB — CBC WITH DIFFERENTIAL (CANCER CENTER ONLY)
Abs Immature Granulocytes: 0.08 K/uL — ABNORMAL HIGH (ref 0.00–0.07)
Basophils Absolute: 0.2 K/uL — ABNORMAL HIGH (ref 0.0–0.1)
Basophils Relative: 2 %
Eosinophils Absolute: 0 K/uL (ref 0.0–0.5)
Eosinophils Relative: 0 %
HCT: 31 % — ABNORMAL LOW (ref 36.0–46.0)
Hemoglobin: 10.4 g/dL — ABNORMAL LOW (ref 12.0–15.0)
Immature Granulocytes: 1 %
Lymphocytes Relative: 14 %
Lymphs Abs: 1.4 K/uL (ref 0.7–4.0)
MCH: 33.4 pg (ref 26.0–34.0)
MCHC: 33.5 g/dL (ref 30.0–36.0)
MCV: 99.7 fL (ref 80.0–100.0)
Monocytes Absolute: 1.7 K/uL — ABNORMAL HIGH (ref 0.1–1.0)
Monocytes Relative: 17 %
Neutro Abs: 6.7 K/uL (ref 1.7–7.7)
Neutrophils Relative %: 66 %
Platelet Count: 461 K/uL — ABNORMAL HIGH (ref 150–400)
RBC: 3.11 MIL/uL — ABNORMAL LOW (ref 3.87–5.11)
RDW: 14.8 % (ref 11.5–15.5)
WBC Count: 10.1 K/uL (ref 4.0–10.5)
nRBC: 0 % (ref 0.0–0.2)

## 2024-07-28 MED ORDER — ANAGRELIDE HCL 0.5 MG PO CAPS
0.5000 mg | ORAL_CAPSULE | Freq: Two times a day (BID) | ORAL | 1 refills | Status: DC
  Filled 2024-07-28: qty 60, 30d supply, fill #0

## 2024-07-28 NOTE — Progress Notes (Signed)
 [facility] Telephone:([phone]   [phone]  PROGRESS NOTE  Patient Care Team: Elliot Charm, MD as PCP - General (Internal Medicine)  Hematological/Oncological History # Essential thrombocytosis, CALR positive # Thrombocytosis 11/16/2023: WBC 7.2, Hgb 11.2, MCV 96.4, Plt 448 12/08/2023: establish care with Dr. Federico.  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 05/31/2024: Transition to anagrelide  1 mg twice daily as hydroxyurea  is dropping her blood counts without controlling the platelets  Interval History:  Rhonda [name] 83 y.o. female with medical history significant for newly diagnosed essential thrombocytosis who presents for a follow up visit. The patient's last visit was on 06/28/2024. In the interim since the last visit she has continued on anagrelide  therapy  On exam today Rhonda [name] reports she recently picked up a viral illness after traveling to [facility] for a celebration.  She reports that both she and her husband had a runny nose, sore throat, and cough.  She does still have some residual cough.  She did test positive for the COVID infection.  She notes that she is making a good recovery but this has left her with a residual cough.  It is a dry cough.  She has been taking Delsym for cough suppression.  She notes that otherwise she has not had any overt bleeding, bruising, or dark stools.  She denies any current fevers, chills, sweats.  Overall she is willing and able to continue on hydroxyurea  therapy at this time.  She does continue to feel fatigue but the medication is not appear to be causing any other side effects.  Today we discussed how the viral illness may affect her platelet count.  Given its modest increase above our target I would recommend continued monitoring.  She voiced understanding of our findings and plan moving  forward.   MEDICAL HISTORY:  Past Medical History:  Diagnosis Date   Anxiety    pt. may have panicy feeling upon awaking from anesth. - states she feels anxious at times  & has used Paxil  & xanax in the [past but its been a very long time ago    Arthritis    rheumatoid & OA-hands, shoulders    Dyspnea    GERD (gastroesophageal reflux disease)    Heart murmur    Hypertension     SURGICAL HISTORY: Past Surgical History:  Procedure Laterality Date   APPENDECTOMY     laproscopic   BREAST BIOPSY Left 09/10/2023   MM LT BREAST BX W LOC DEV 1ST LESION IMAGE BX SPEC STEREO GUIDE 09/10/2023 [facility]   BREAST BIOPSY Left 09/10/2023   MM LT BREAST BX W LOC DEV EA AD LESION IMG BX SPEC STEREO GUIDE 09/10/2023 [facility]   COLONOSCOPY     HEMORROIDECTOMY     INSERTION OF MESH N/A 09/12/2018   Procedure: INSERTION OF MESH;  Surgeon: Curvin Deward MOULD, MD;  Location: [facility] OR;  Service: General;  Laterality: N/A;   LAPAROSCOPIC APPENDECTOMY N/A 01/06/2017   Procedure: LAPAROSCOPIC APPENDECTOMY;  Surgeon: Deward Curvin III, MD;  Location: [facility];  Service: General;  Laterality: N/A;   TONSILLECTOMY     as a child   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   Procedure: HIP ARTHROPLASTY ANTERIOR APPROACH;  Surgeon: Addie Cordella Hamilton, MD;  Location: [facility];  Service: Orthopedics;  Laterality: Left;   VAGINAL DELIVERY     x2   VENTRAL HERNIA REPAIR N/A 09/12/2018  Procedure: LAPAROSCOPIC VENTRAL HERNIA REPAIR WITH MESH;  Surgeon: Curvin Deward MOULD, MD;  Location: [facility] OR;  Service: General;  Laterality: N/A;   VIDEO BRONCHOSCOPY Bilateral 11/07/2019   Procedure: VIDEO BRONCHOSCOPY WITH FLUORO;  Surgeon: Shellia Oh, MD;  Location: [facility];  Service: Endoscopy;  Laterality: Bilateral;    SOCIAL HISTORY: Social History   Socioeconomic History   Marital status: Married    Spouse name: Not on file   Number of children: Not on file   Years of education: Not on file   Highest education  level: Not on file  Occupational History   Not on file  Tobacco Use   Smoking status: Never   Smokeless tobacco: Never  Substance and Sexual Activity   Alcohol  use: Yes    Comment: daily- wine & bourbon & ginger    Drug use: No   Sexual activity: Not on file  Other Topics Concern   Not on file  Social History Narrative   Not on file   Social Drivers of Health   Financial Resource Strain: Not on file  Food Insecurity: Not on file  Transportation Needs: Not on file  Physical Activity: Not on file  Stress: Not on file  Social Connections: Not on file  Intimate Partner Violence: Not on file    FAMILY HISTORY: Family History  Problem Relation Age of Onset   Breast cancer Daughter    Breast cancer Maternal Grandfather     ALLERGIES:  is allergic to aleve [naproxen].  MEDICATIONS:  Current Outpatient Medications  Medication Sig Dispense Refill   acetaminophen  (TYLENOL ) 650 MG CR tablet Take 1,300 mg by mouth every 8 (eight) hours as needed for pain.     amLODipine  (NORVASC ) 5 MG tablet Take 5 mg by mouth daily.     anagrelide  (AGRYLIN) 0.5 MG capsule Take 1 capsule (0.5 mg total) by mouth 2 (two) times daily. 180 capsule 1   Carboxymethylcellulose Sodium (REFRESH LIQUIGEL) 1 % GEL Place 1 drop into both eyes at bedtime.     Cholecalciferol (VITAMIN D) 2000 units CAPS Take 2,000 Units by mouth daily.      clobetasol cream (TEMOVATE) 0.05 % Apply 1 application topically 2 (two) times daily as needed (irritation).     diclofenac Sodium (VOLTAREN) 1 % GEL Apply 1 application topically 2 (two) times daily as needed (arthritis pain).     estradiol (ESTRACE) 0.1 MG/GM vaginal cream Place 1 Applicatorful vaginally daily as needed (for vaginal discomfort/dryness).      folic acid  (FOLVITE ) 800 MCG tablet Take 800 mcg by mouth daily.      Glucosamine-Chondroitin (MOVE FREE PO) Take 1 capsule by mouth 2 (two) times daily.     hydroxychloroquine  (PLAQUENIL ) 200 MG tablet TAKE 1 TABLET  BY MOUTH TWICE  DAILY 180 tablet 3   levothyroxine  (SYNTHROID , LEVOTHROID) 25 MCG tablet Take 25 mcg by mouth daily before breakfast.      methotrexate  (RHEUMATREX) 2.5 MG tablet Take 12.5 mg by mouth every Friday.      Multiple Minerals-Vitamins (CALCIUM-MAGNESIUM-ZINC-D3 PO) Take 1 tablet by mouth 2 (two) times daily.      Multiple Vitamin (MULTIVITAMIN WITH MINERALS) TABS tablet Take 1 tablet by mouth daily.     olmesartan (BENICAR) 20 MG tablet Take 20 mg by mouth daily.      omeprazole (PRILOSEC) 40 MG capsule Take 40 mg by mouth daily.     PARoxetine  (PAXIL ) 20 MG tablet Take 20 mg by mouth daily.  polyethylene glycol (MIRALAX / GLYCOLAX) packet Take 17 g by mouth daily.      Polyvinyl Alcohol -Povidone PF (REFRESH) 1.4-0.6 % SOLN Place 1 drop into both eyes daily.     Probiotic Product (PROBIOTIC DAILY PO) Take 1 capsule by mouth daily.     simvastatin  (ZOCOR ) 40 MG tablet Take 40 mg by mouth at bedtime.      TURMERIC PO Take 2 capsules by mouth daily.      No current facility-administered medications for this visit.    REVIEW OF SYSTEMS:   Constitutional: ( - ) fevers, ( - )  chills , ( - ) night sweats Eyes: ( - ) blurriness of vision, ( - ) double vision, ( - ) watery eyes Ears, nose, mouth, throat, and face: ( - ) mucositis, ( - ) sore throat Respiratory: ( - ) cough, ( - ) dyspnea, ( - ) wheezes Cardiovascular: ( - ) palpitation, ( - ) chest discomfort, ( - ) lower extremity swelling Gastrointestinal:  ( - ) nausea, ( - ) heartburn, ( - ) change in bowel habits Skin: ( - ) abnormal skin rashes Lymphatics: ( - ) new lymphadenopathy, ( - ) easy bruising Neurological: ( - ) numbness, ( - ) tingling, ( - ) new weaknesses Behavioral/Psych: ( - ) mood change, ( - ) new changes  All other systems were reviewed with the patient and are negative.  PHYSICAL EXAMINATION:  Vitals:   07/28/24 1146  BP: 139/76  Pulse: 80  Resp: 15  Temp: (!) 97.2 F (36.2 C)  SpO2: 99%       Filed Weights   07/28/24 1146  Weight: 180 lb 6.4 oz (81.8 kg)       GENERAL: Well-appearing elderly Caucasian female, alert, no distress and comfortable SKIN: skin color, texture, turgor are normal, no rashes or significant lesions EYES: conjunctiva are pink and non-injected, sclera clear LUNGS: clear to auscultation and percussion with normal breathing effort HEART: regular rate & rhythm and no murmurs and no lower extremity edema Musculoskeletal: no cyanosis of digits and no clubbing  PSYCH: alert & oriented x 3, fluent speech NEURO: no focal motor/sensory deficits  LABORATORY DATA:  I have reviewed the data as listed    Latest Ref Rng & Units 07/28/2024   10:56 AM 07/14/2024    2:09 PM 06/28/2024    2:36 PM  CBC  WBC 4.0 - 10.5 K/uL 10.1  10.2  9.4   Hemoglobin 12.0 - 15.0 g/dL 89.5  89.4  89.6   Hematocrit 36.0 - 46.0 % 31.0  31.0  31.3   Platelets 150 - 400 K/uL 461  326  513        Latest Ref Rng & Units 07/28/2024   10:56 AM 07/14/2024    2:09 PM 06/28/2024    2:36 PM  CMP  Glucose 70 - 99 mg/dL 891  895  93   BUN 8 - 23 mg/dL 22  21  15    Creatinine 0.44 - 1.00 mg/dL 8.90  9.03  9.08   Sodium 135 - 145 mmol/L 135  130  136   Potassium 3.5 - 5.1 mmol/L 4.7  4.5  4.6   Chloride 98 - 111 mmol/L 103  98  100   CO2 22 - 32 mmol/L 27  28  29    Calcium 8.9 - 10.3 mg/dL 8.9  9.0  9.2   Total Protein 6.5 - 8.1 g/dL 6.7  6.7  6.9   Total  Bilirubin 0.0 - 1.2 mg/dL 0.3  0.4  0.4   Alkaline Phos 38 - 126 U/L 66  69  74   AST 15 - 41 U/L 22  23  22    ALT 0 - 44 U/L 15  15  17       RADIOGRAPHIC STUDIES: I have personally reviewed the radiological images as listed and agreed with the findings in the report. No results found.    ASSESSMENT & PLAN Rhonda [name] 83 y.o. female with medical history significant for newly diagnosed essential thrombocytosis who presents for a follow up visit.  # Essential thrombocytosis, CALR Positive --Diagnosis confirmed by the  presence of a CALR mutation as well as bone marrow biopsy showing abnormal megakaryocytes. --Due to drop in hemoglobin and platelets at anagrelide  1 mg twice daily will drop to 0.5 mg twice daily instead. -- Will assure CBC, CMP at each visit. -- Target platelets between 150 and 400.  Will attempt to minimalize drop in white blood cell or hemoglobin.  Unfortunately this has been unsuccessful with hydroxyurea  and I therefore transitioning to anagrelide  -- labs today show white blood cell 10.1, Hgb 10.4, MCV 99.7, Plt 461  --Patient is slightly above target but did recently have a viral illness which may have suppressed her hemoglobin and bolster the platelets.  Will continue with her current dose and consider readjusting to 0 point 5 in the morning and 1 mg at night if her levels are persistently high -- Return to clinic in 2 weeks for labs in 4 weeks for labs and clinic visit.  No orders of the defined types were placed in this encounter.   All questions were answered. The patient knows to call the clinic with any problems, questions or concerns.  A total of more than 30 minutes were spent on this encounter with face-to-face time and non-face-to-face time, including preparing to see the patient, ordering tests and/or medications, counseling the patient and coordination of care as outlined above.   Norleen IVAR Kidney, MD Department of Hematology/Oncology [facility] Phone: [phone] Pager: [phone] Email: [email or web address]  07/30/2024 9:09 PM

## 2024-08-11 ENCOUNTER — Inpatient Hospital Stay

## 2024-08-11 DIAGNOSIS — Z79899 Other long term (current) drug therapy: Secondary | ICD-10-CM | POA: Diagnosis not present

## 2024-08-11 DIAGNOSIS — D75839 Thrombocytosis, unspecified: Secondary | ICD-10-CM | POA: Diagnosis not present

## 2024-08-11 DIAGNOSIS — Z7964 Long term (current) use of myelosuppressive agent: Secondary | ICD-10-CM | POA: Diagnosis not present

## 2024-08-11 DIAGNOSIS — D473 Essential (hemorrhagic) thrombocythemia: Secondary | ICD-10-CM

## 2024-08-11 DIAGNOSIS — Z803 Family history of malignant neoplasm of breast: Secondary | ICD-10-CM | POA: Diagnosis not present

## 2024-08-11 LAB — CBC WITH DIFFERENTIAL (CANCER CENTER ONLY)
Abs Immature Granulocytes: 0.08 K/uL — ABNORMAL HIGH (ref 0.00–0.07)
Basophils Absolute: 0.1 K/uL (ref 0.0–0.1)
Basophils Relative: 2 %
Eosinophils Absolute: 0.1 K/uL (ref 0.0–0.5)
Eosinophils Relative: 1 %
HCT: 31.3 % — ABNORMAL LOW (ref 36.0–46.0)
Hemoglobin: 10.6 g/dL — ABNORMAL LOW (ref 12.0–15.0)
Immature Granulocytes: 1 %
Lymphocytes Relative: 18 %
Lymphs Abs: 1.3 K/uL (ref 0.7–4.0)
MCH: 32.8 pg (ref 26.0–34.0)
MCHC: 33.9 g/dL (ref 30.0–36.0)
MCV: 96.9 fL (ref 80.0–100.0)
Monocytes Absolute: 1.3 K/uL — ABNORMAL HIGH (ref 0.1–1.0)
Monocytes Relative: 17 %
Neutro Abs: 4.8 K/uL (ref 1.7–7.7)
Neutrophils Relative %: 61 %
Platelet Count: 247 K/uL (ref 150–400)
RBC: 3.23 MIL/uL — ABNORMAL LOW (ref 3.87–5.11)
RDW: 15.1 % (ref 11.5–15.5)
WBC Count: 7.6 K/uL (ref 4.0–10.5)
nRBC: 0 % (ref 0.0–0.2)

## 2024-08-11 LAB — CMP (CANCER CENTER ONLY)
ALT: 16 U/L (ref 0–44)
AST: 22 U/L (ref 15–41)
Albumin: 4.4 g/dL (ref 3.5–5.0)
Alkaline Phosphatase: 78 U/L (ref 38–126)
Anion gap: 7 (ref 5–15)
BUN: 15 mg/dL (ref 8–23)
CO2: 27 mmol/L (ref 22–32)
Calcium: 9.1 mg/dL (ref 8.9–10.3)
Chloride: 98 mmol/L (ref 98–111)
Creatinine: 1 mg/dL (ref 0.44–1.00)
GFR, Estimated: 56 mL/min — ABNORMAL LOW (ref >60)
Glucose, Bld: 107 mg/dL — ABNORMAL HIGH (ref 70–99)
Potassium: 4.6 mmol/L (ref 3.5–5.1)
Sodium: 132 mmol/L — ABNORMAL LOW (ref 135–145)
Total Bilirubin: 0.4 mg/dL (ref 0.0–1.2)
Total Protein: 6.6 g/dL (ref 6.5–8.1)

## 2024-08-15 DIAGNOSIS — I700 Atherosclerosis of aorta: Secondary | ICD-10-CM | POA: Diagnosis not present

## 2024-08-15 DIAGNOSIS — E785 Hyperlipidemia, unspecified: Secondary | ICD-10-CM | POA: Diagnosis not present

## 2024-08-15 DIAGNOSIS — Z0000 Encounter for general adult medical examination without abnormal findings: Secondary | ICD-10-CM | POA: Diagnosis not present

## 2024-08-15 DIAGNOSIS — I2510 Atherosclerotic heart disease of native coronary artery without angina pectoris: Secondary | ICD-10-CM | POA: Diagnosis not present

## 2024-08-15 DIAGNOSIS — E039 Hypothyroidism, unspecified: Secondary | ICD-10-CM | POA: Diagnosis not present

## 2024-08-15 DIAGNOSIS — N1831 Chronic kidney disease, stage 3a: Secondary | ICD-10-CM | POA: Diagnosis not present

## 2024-08-15 DIAGNOSIS — M069 Rheumatoid arthritis, unspecified: Secondary | ICD-10-CM | POA: Diagnosis not present

## 2024-08-15 DIAGNOSIS — Z7189 Other specified counseling: Secondary | ICD-10-CM | POA: Diagnosis not present

## 2024-08-15 DIAGNOSIS — M85859 Other specified disorders of bone density and structure, unspecified thigh: Secondary | ICD-10-CM | POA: Diagnosis not present

## 2024-08-15 DIAGNOSIS — I10 Essential (primary) hypertension: Secondary | ICD-10-CM | POA: Diagnosis not present

## 2024-08-15 DIAGNOSIS — K5900 Constipation, unspecified: Secondary | ICD-10-CM | POA: Diagnosis not present

## 2024-08-15 DIAGNOSIS — Z136 Encounter for screening for cardiovascular disorders: Secondary | ICD-10-CM | POA: Diagnosis not present

## 2024-08-22 DIAGNOSIS — S86112A Strain of other muscle(s) and tendon(s) of posterior muscle group at lower leg level, left leg, initial encounter: Secondary | ICD-10-CM | POA: Diagnosis not present

## 2024-08-24 DIAGNOSIS — D75839 Thrombocytosis, unspecified: Secondary | ICD-10-CM | POA: Diagnosis not present

## 2024-08-24 DIAGNOSIS — M545 Low back pain, unspecified: Secondary | ICD-10-CM | POA: Diagnosis not present

## 2024-08-24 DIAGNOSIS — R202 Paresthesia of skin: Secondary | ICD-10-CM | POA: Diagnosis not present

## 2024-08-24 DIAGNOSIS — M1991 Primary osteoarthritis, unspecified site: Secondary | ICD-10-CM | POA: Diagnosis not present

## 2024-08-24 DIAGNOSIS — Z79899 Other long term (current) drug therapy: Secondary | ICD-10-CM | POA: Diagnosis not present

## 2024-08-24 DIAGNOSIS — M0609 Rheumatoid arthritis without rheumatoid factor, multiple sites: Secondary | ICD-10-CM | POA: Diagnosis not present

## 2024-08-25 ENCOUNTER — Inpatient Hospital Stay

## 2024-08-25 ENCOUNTER — Inpatient Hospital Stay: Admitting: Hematology and Oncology

## 2024-08-25 ENCOUNTER — Other Ambulatory Visit: Payer: Self-pay | Admitting: *Deleted

## 2024-08-25 ENCOUNTER — Other Ambulatory Visit (HOSPITAL_COMMUNITY): Payer: Self-pay

## 2024-08-25 VITALS — BP 146/73 | HR 86 | Temp 97.5°F | Resp 15 | Wt 180.2 lb

## 2024-08-25 DIAGNOSIS — D75839 Thrombocytosis, unspecified: Secondary | ICD-10-CM

## 2024-08-25 DIAGNOSIS — D473 Essential (hemorrhagic) thrombocythemia: Secondary | ICD-10-CM

## 2024-08-25 DIAGNOSIS — Z803 Family history of malignant neoplasm of breast: Secondary | ICD-10-CM | POA: Diagnosis not present

## 2024-08-25 DIAGNOSIS — Z7964 Long term (current) use of myelosuppressive agent: Secondary | ICD-10-CM | POA: Diagnosis not present

## 2024-08-25 DIAGNOSIS — Z79899 Other long term (current) drug therapy: Secondary | ICD-10-CM | POA: Diagnosis not present

## 2024-08-25 LAB — CBC WITH DIFFERENTIAL (CANCER CENTER ONLY)
Abs Immature Granulocytes: 0.1 K/uL — ABNORMAL HIGH (ref 0.00–0.07)
Basophils Absolute: 0.1 K/uL (ref 0.0–0.1)
Basophils Relative: 2 %
Eosinophils Absolute: 0.1 K/uL (ref 0.0–0.5)
Eosinophils Relative: 2 %
HCT: 31.8 % — ABNORMAL LOW (ref 36.0–46.0)
Hemoglobin: 10.6 g/dL — ABNORMAL LOW (ref 12.0–15.0)
Immature Granulocytes: 2 %
Lymphocytes Relative: 20 %
Lymphs Abs: 1.2 K/uL (ref 0.7–4.0)
MCH: 32.2 pg (ref 26.0–34.0)
MCHC: 33.3 g/dL (ref 30.0–36.0)
MCV: 96.7 fL (ref 80.0–100.0)
Monocytes Absolute: 1 K/uL (ref 0.1–1.0)
Monocytes Relative: 16 %
Neutro Abs: 3.6 K/uL (ref 1.7–7.7)
Neutrophils Relative %: 58 %
Platelet Count: 250 K/uL (ref 150–400)
RBC: 3.29 MIL/uL — ABNORMAL LOW (ref 3.87–5.11)
RDW: 15.3 % (ref 11.5–15.5)
WBC Count: 6.2 K/uL (ref 4.0–10.5)
nRBC: 0 % (ref 0.0–0.2)

## 2024-08-25 LAB — CMP (CANCER CENTER ONLY)
ALT: 14 U/L (ref 0–44)
AST: 22 U/L (ref 15–41)
Albumin: 4.3 g/dL (ref 3.5–5.0)
Alkaline Phosphatase: 80 U/L (ref 38–126)
Anion gap: 5 (ref 5–15)
BUN: 20 mg/dL (ref 8–23)
CO2: 28 mmol/L (ref 22–32)
Calcium: 9.1 mg/dL (ref 8.9–10.3)
Chloride: 100 mmol/L (ref 98–111)
Creatinine: 1.03 mg/dL — ABNORMAL HIGH (ref 0.44–1.00)
GFR, Estimated: 54 mL/min — ABNORMAL LOW (ref >60)
Glucose, Bld: 99 mg/dL (ref 70–99)
Potassium: 4.5 mmol/L (ref 3.5–5.1)
Sodium: 133 mmol/L — ABNORMAL LOW (ref 135–145)
Total Bilirubin: 0.4 mg/dL (ref 0.0–1.2)
Total Protein: 6.9 g/dL (ref 6.5–8.1)

## 2024-08-25 MED ORDER — ANAGRELIDE HCL 0.5 MG PO CAPS
0.5000 mg | ORAL_CAPSULE | Freq: Two times a day (BID) | ORAL | 3 refills | Status: DC
  Filled 2024-08-25: qty 60, 30d supply, fill #0
  Filled 2024-09-20: qty 60, 30d supply, fill #1

## 2024-08-25 MED ORDER — ANAGRELIDE HCL 0.5 MG PO CAPS
0.5000 mg | ORAL_CAPSULE | Freq: Two times a day (BID) | ORAL | 1 refills | Status: DC
  Filled 2024-08-25: qty 180, 90d supply, fill #0

## 2024-08-25 NOTE — Progress Notes (Signed)
 [facility] [phone]) [phone]   [phone]  PROGRESS NOTE  Patient Care Team: Elliot Charm, MD as PCP - General (Internal Medicine)  Hematological/Oncological History # Essential thrombocytosis, CALR positive # Thrombocytosis 11/16/2023: WBC 7.2, Hgb 11.2, MCV 96.4, Plt 448 12/08/2023: establish care with Dr. Federico.  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 05/31/2024: Transition to anagrelide  1 mg twice daily as hydroxyurea  is dropping her blood counts without controlling the platelets  Interval History:  Rhonda [name] 83 y.o. female with medical history significant for newly diagnosed essential thrombocytosis who presents for a follow up visit. The patient's last visit was on 07/28/2024. In the interim since the last visit she has continued on anagrelide  therapy  On exam today Rhonda [name] reports she has been feeling well overall in the interim since our last visit 4 weeks ago.  She reports that she does have some cough on and off which is productive for a clear sputum.  She reports she is not having any runny nose or sore throat.  She is not having any fevers, chills, sweats.  She reports her energy levels are pretty low and get as low as 5 out of 10.  She reports it was low this morning.  She notes she is not currently having any lightheadedness, dizziness, or shortness of breath.  She notes that she does have chronic pain in her back and shoulders but nothing new or more pronounced.  She is not having any trouble with bleeding, bruising, or dark stools.  She reports that she is currently paying $0 for monthly supply of the medication as it is covered by a grant.  Overall she feels well and has no additional questions concerns or complaints today.  A full 10 point ROS is otherwise negative.    MEDICAL HISTORY:  Past Medical History:  Diagnosis  Date   Anxiety    pt. may have panicy feeling upon awaking from anesth. - states she feels anxious at times  & has used Paxil  & xanax in the [past but its been a very long time ago    Arthritis    rheumatoid & OA-hands, shoulders    Dyspnea    GERD (gastroesophageal reflux disease)    Heart murmur    Hypertension     SURGICAL HISTORY: Past Surgical History:  Procedure Laterality Date   APPENDECTOMY     laproscopic   BREAST BIOPSY Left 09/10/2023   MM LT BREAST BX W LOC DEV 1ST LESION IMAGE BX SPEC STEREO GUIDE 09/10/2023 [facility]   BREAST BIOPSY Left 09/10/2023   MM LT BREAST BX W LOC DEV EA AD LESION IMG BX SPEC STEREO GUIDE 09/10/2023 [facility]   COLONOSCOPY     HEMORROIDECTOMY     INSERTION OF MESH N/A 09/12/2018   Procedure: INSERTION OF MESH;  Surgeon: Curvin Deward MOULD, MD;  [facility]: [facility];  Service: General;  Laterality: N/A;   LAPAROSCOPIC APPENDECTOMY N/A 01/06/2017   Procedure: LAPAROSCOPIC APPENDECTOMY;  Surgeon: Deward Curvin III, MD;  Location: [facility] OR;  Service: General;  Laterality: N/A;   TONSILLECTOMY     as a child   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   Procedure: HIP ARTHROPLASTY ANTERIOR APPROACH;  Surgeon: Addie Cordella Hamilton, MD;  Location: [facility];  Service: Orthopedics;  Laterality: Left;   VAGINAL DELIVERY     x2   VENTRAL HERNIA REPAIR N/A 09/12/2018  Procedure: LAPAROSCOPIC VENTRAL HERNIA REPAIR WITH MESH;  Surgeon: Curvin Deward MOULD, MD;  Location: [facility] OR;  Service: General;  Laterality: N/A;   VIDEO BRONCHOSCOPY Bilateral 11/07/2019   Procedure: VIDEO BRONCHOSCOPY WITH FLUORO;  Surgeon: Shellia Oh, MD;  Location: [facility];  Service: Endoscopy;  Laterality: Bilateral;    SOCIAL HISTORY: Social History   Socioeconomic History   Marital status: Married    Spouse name: Not on file   Number of children: Not on file   Years of education: Not on file   Highest education level: Not on file  Occupational History   Not on file  Tobacco Use    Smoking status: Never   Smokeless tobacco: Never  Substance and Sexual Activity   Alcohol  use: Yes    Comment: daily- wine & bourbon & ginger    Drug use: No   Sexual activity: Not on file  Other Topics Concern   Not on file  Social History Narrative   Not on file   Social Drivers of Health   Financial Resource Strain: Not on file  Food Insecurity: Not on file  Transportation Needs: Not on file  Physical Activity: Not on file  Stress: Not on file  Social Connections: Not on file  Intimate Partner Violence: Not on file    FAMILY HISTORY: Family History  Problem Relation Age of Onset   Breast cancer Daughter    Breast cancer Maternal Grandfather     ALLERGIES:  is allergic to aleve [naproxen].  MEDICATIONS:  Current Outpatient Medications  Medication Sig Dispense Refill   amLODipine  (NORVASC ) 5 MG tablet Take 5 mg by mouth daily. (Patient taking differently: Take 5 mg by mouth 2 (two) times daily.)     acetaminophen  (TYLENOL ) 650 MG CR tablet Take 1,300 mg by mouth every 8 (eight) hours as needed for pain.     anagrelide  (AGRYLIN) 0.5 MG capsule Take 1 capsule (0.5 mg total) by mouth 2 (two) times daily. 60 capsule 3   Carboxymethylcellulose Sodium (REFRESH LIQUIGEL) 1 % GEL Place 1 drop into both eyes at bedtime.     Cholecalciferol (VITAMIN D) 2000 units CAPS Take 2,000 Units by mouth daily.      clobetasol cream (TEMOVATE) 0.05 % Apply 1 application topically 2 (two) times daily as needed (irritation).     diclofenac Sodium (VOLTAREN) 1 % GEL Apply 1 application topically 2 (two) times daily as needed (arthritis pain).     estradiol (ESTRACE) 0.1 MG/GM vaginal cream Place 1 Applicatorful vaginally daily as needed (for vaginal discomfort/dryness).      folic acid  (FOLVITE ) 800 MCG tablet Take 800 mcg by mouth daily.      Glucosamine-Chondroitin (MOVE FREE PO) Take 1 capsule by mouth 2 (two) times daily.     hydroxychloroquine  (PLAQUENIL ) 200 MG tablet TAKE 1 TABLET BY  MOUTH TWICE  DAILY 180 tablet 3   levothyroxine  (SYNTHROID , LEVOTHROID) 25 MCG tablet Take 25 mcg by mouth daily before breakfast.      methotrexate  (RHEUMATREX) 2.5 MG tablet Take 12.5 mg by mouth every Friday.      Multiple Minerals-Vitamins (CALCIUM-MAGNESIUM-ZINC-D3 PO) Take 1 tablet by mouth 2 (two) times daily.      Multiple Vitamin (MULTIVITAMIN WITH MINERALS) TABS tablet Take 1 tablet by mouth daily.     olmesartan (BENICAR) 20 MG tablet Take 20 mg by mouth daily.      omeprazole (PRILOSEC) 40 MG capsule Take 40 mg by mouth daily.     PARoxetine  (PAXIL ) 20  MG tablet Take 20 mg by mouth daily.     polyethylene glycol (MIRALAX / GLYCOLAX) packet Take 17 g by mouth daily.      Polyvinyl Alcohol -Povidone PF (REFRESH) 1.4-0.6 % SOLN Place 1 drop into both eyes daily.     Probiotic Product (PROBIOTIC DAILY PO) Take 1 capsule by mouth daily.     simvastatin  (ZOCOR ) 40 MG tablet Take 40 mg by mouth at bedtime.      TURMERIC PO Take 2 capsules by mouth daily.      No current facility-administered medications for this visit.    REVIEW OF SYSTEMS:   Constitutional: ( - ) fevers, ( - )  chills , ( - ) night sweats Eyes: ( - ) blurriness of vision, ( - ) double vision, ( - ) watery eyes Ears, nose, mouth, throat, and face: ( - ) mucositis, ( - ) sore throat Respiratory: ( - ) cough, ( - ) dyspnea, ( - ) wheezes Cardiovascular: ( - ) palpitation, ( - ) chest discomfort, ( - ) lower extremity swelling Gastrointestinal:  ( - ) nausea, ( - ) heartburn, ( - ) change in bowel habits Skin: ( - ) abnormal skin rashes Lymphatics: ( - ) new lymphadenopathy, ( - ) easy bruising Neurological: ( - ) numbness, ( - ) tingling, ( - ) new weaknesses Behavioral/Psych: ( - ) mood change, ( - ) new changes  All other systems were reviewed with the patient and are negative.  PHYSICAL EXAMINATION:  Vitals:   08/25/24 1434  BP: (!) 146/73  Pulse: 86  Resp: 15  Temp: (!) 97.5 F (36.4 C)  SpO2: 96%    Filed Weights   08/25/24 1434  Weight: 180 lb 3.2 oz (81.7 kg)    GENERAL: Well-appearing elderly Caucasian female, alert, no distress and comfortable SKIN: skin color, texture, turgor are normal, no rashes or significant lesions EYES: conjunctiva are pink and non-injected, sclera clear LUNGS: clear to auscultation and percussion with normal breathing effort HEART: regular rate & rhythm and no murmurs and no lower extremity edema Musculoskeletal: no cyanosis of digits and no clubbing  PSYCH: alert & oriented x 3, fluent speech NEURO: no focal motor/sensory deficits  LABORATORY DATA:  I have reviewed the data as listed    Latest Ref Rng & Units 08/25/2024    2:04 PM 08/11/2024    2:33 PM 07/28/2024   10:56 AM  CBC  WBC 4.0 - 10.5 K/uL 6.2  7.6  10.1   Hemoglobin 12.0 - 15.0 g/dL 89.3  89.3  89.5   Hematocrit 36.0 - 46.0 % 31.8  31.3  31.0   Platelets 150 - 400 K/uL 250  247  461        Latest Ref Rng & Units 08/25/2024    2:04 PM 08/11/2024    2:33 PM 07/28/2024   10:56 AM  CMP  Glucose 70 - 99 mg/dL 99  892  891   BUN 8 - 23 mg/dL 20  15  22    Creatinine 0.44 - 1.00 mg/dL 8.96  8.99  8.90   Sodium 135 - 145 mmol/L 133  132  135   Potassium 3.5 - 5.1 mmol/L 4.5  4.6  4.7   Chloride 98 - 111 mmol/L 100  98  103   CO2 22 - 32 mmol/L 28  27  27    Calcium 8.9 - 10.3 mg/dL 9.1  9.1  8.9   Total Protein 6.5 - 8.1 g/dL 6.9  6.6  6.7   Total Bilirubin 0.0 - 1.2 mg/dL 0.4  0.4  0.3   Alkaline Phos 38 - 126 U/L 80  78  66   AST 15 - 41 U/L 22  22  22    ALT 0 - 44 U/L 14  16  15       RADIOGRAPHIC STUDIES: No results found.  ASSESSMENT & PLAN Rhonda [name] 83 y.o. female with medical history significant for newly diagnosed essential thrombocytosis who presents for a follow up visit.  # Essential thrombocytosis, CALR Positive --Diagnosis confirmed by the presence of a CALR mutation as well as bone marrow biopsy showing abnormal megakaryocytes. --continue anagrelide  0.5 mg  twice daily  -- Will assure CBC, CMP at each visit. -- Target platelets between 150 and 400.  Will attempt to minimalize drop in white blood cell or hemoglobin.  Unfortunately this has been unsuccessful with hydroxyurea  and I therefore transitioning to anagrelide  -- labs today show white blood cell 6.2, Hgb 10.6, MCV 96.7, Plt 250  -- Return to clinic in 4 weeks for labs and clinic visit.  No orders of the defined types were placed in this encounter.   All questions were answered. The patient knows to call the clinic with any problems, questions or concerns.  A total of more than 30 minutes were spent on this encounter with face-to-face time and non-face-to-face time, including preparing to see the patient, ordering tests and/or medications, counseling the patient and coordination of care as outlined above.   Norleen IVAR Kidney, MD Department of Hematology/Oncology [facility] Phone: [phone] Pager: [phone] Email: [email or web address]  08/25/2024 5:11 PM

## 2024-08-26 ENCOUNTER — Other Ambulatory Visit (HOSPITAL_COMMUNITY): Payer: Self-pay

## 2024-09-25 NOTE — Progress Notes (Unsigned)
 [facility] [phone]   [phone]  PROGRESS NOTE  Patient Care Team: Elliot Charm, MD as PCP - General (Internal Medicine)  Hematological/Oncological History # Essential thrombocytosis, CALR positive # Thrombocytosis 11/16/2023: WBC 7.2, Hgb 11.2, MCV 96.4, Plt 448 12/08/2023: establish care with Dr. Federico.  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 05/31/2024: Transition to anagrelide  1 mg twice daily as hydroxyurea  is dropping her blood counts without controlling the platelets  Interval History:  Rhonda [name] 83 y.o. female with medical history significant for newly diagnosed essential thrombocytosis who presents for a follow up visit. The patient's last visit was on 08/25/2024. In the interim since the last visit she has continued on anagrelide  therapy  On exam today Rhonda [name] reports***    MEDICAL HISTORY:  Past Medical History:  Diagnosis Date   Anxiety    pt. may have panicy feeling upon awaking from anesth. - states she feels anxious at times  & has used Paxil  & xanax in the [past but its been a very long time ago    Arthritis    rheumatoid & OA-hands, shoulders    Dyspnea    GERD (gastroesophageal reflux disease)    Heart murmur    Hypertension     SURGICAL HISTORY: Past Surgical History:  Procedure Laterality Date   APPENDECTOMY     laproscopic   BREAST BIOPSY Left 09/10/2023   MM LT BREAST BX W LOC DEV 1ST LESION IMAGE BX SPEC STEREO GUIDE 09/10/2023 [facility]   BREAST BIOPSY Left 09/10/2023   MM LT BREAST BX W LOC DEV EA AD LESION IMG BX SPEC STEREO GUIDE 09/10/2023 [facility]   COLONOSCOPY     HEMORROIDECTOMY     INSERTION OF MESH N/A 09/12/2018   Procedure: INSERTION OF MESH;  Surgeon: Curvin Deward MOULD, MD;  Location: [facility] OR;  Service: General;  Laterality: N/A;   LAPAROSCOPIC APPENDECTOMY  N/A 01/06/2017   Procedure: LAPAROSCOPIC APPENDECTOMY;  Surgeon: Deward Curvin III, MD;  Location: [facility] OR;  Service: General;  Laterality: N/A;   TONSILLECTOMY     as a child   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   Procedure: HIP ARTHROPLASTY ANTERIOR APPROACH;  Surgeon: Addie Cordella Hamilton, MD;  Location: [facility];  Service: Orthopedics;  Laterality: Left;   VAGINAL DELIVERY     x2   VENTRAL HERNIA REPAIR N/A 09/12/2018   Procedure: LAPAROSCOPIC VENTRAL HERNIA REPAIR WITH MESH;  Surgeon: Curvin Deward MOULD, MD;  Location: [facility] OR;  Service: General;  Laterality: N/A;   VIDEO BRONCHOSCOPY Bilateral 11/07/2019   Procedure: VIDEO BRONCHOSCOPY WITH FLUORO;  Surgeon: Shellia Oh, MD;  Location: [facility];  Service: Endoscopy;  Laterality: Bilateral;    SOCIAL HISTORY: Social History   Socioeconomic History   Marital status: Married    Spouse name: Not on file   Number of children: Not on file   Years of education: Not on file   Highest education level: Not on file  Occupational History   Not on file  Tobacco Use   Smoking status: Never   Smokeless tobacco: Never  Substance and Sexual Activity   Alcohol  use: Yes    Comment: daily- wine & bourbon & ginger    Drug use: No   Sexual activity: Not on file  Other Topics Concern   Not on file  Social History Narrative   Not on file   Social Drivers of  Health   Financial Resource Strain: Not on file  Food Insecurity: Not on file  Transportation Needs: Not on file  Physical Activity: Not on file  Stress: Not on file  Social Connections: Not on file  Intimate Partner Violence: Not on file    FAMILY HISTORY: Family History  Problem Relation Age of Onset   Breast cancer Daughter    Breast cancer Maternal Grandfather     ALLERGIES:  is allergic to aleve [naproxen].  MEDICATIONS:  Current Outpatient Medications  Medication Sig Dispense Refill   acetaminophen  (TYLENOL ) 650 MG CR tablet Take 1,300 mg by mouth every 8 (eight) hours as  needed for pain.     amLODipine  (NORVASC ) 5 MG tablet Take 5 mg by mouth daily. (Patient taking differently: Take 5 mg by mouth 2 (two) times daily.)     anagrelide  (AGRYLIN) 0.5 MG capsule Take 1 capsule (0.5 mg total) by mouth 2 (two) times daily. 60 capsule 3   Carboxymethylcellulose Sodium (REFRESH LIQUIGEL) 1 % GEL Place 1 drop into both eyes at bedtime.     Cholecalciferol (VITAMIN D) 2000 units CAPS Take 2,000 Units by mouth daily.      clobetasol cream (TEMOVATE) 0.05 % Apply 1 application topically 2 (two) times daily as needed (irritation).     diclofenac Sodium (VOLTAREN) 1 % GEL Apply 1 application topically 2 (two) times daily as needed (arthritis pain).     estradiol (ESTRACE) 0.1 MG/GM vaginal cream Place 1 Applicatorful vaginally daily as needed (for vaginal discomfort/dryness).      folic acid  (FOLVITE ) 800 MCG tablet Take 800 mcg by mouth daily.      Glucosamine-Chondroitin (MOVE FREE PO) Take 1 capsule by mouth 2 (two) times daily.     hydroxychloroquine  (PLAQUENIL ) 200 MG tablet TAKE 1 TABLET BY MOUTH TWICE  DAILY 180 tablet 3   levothyroxine  (SYNTHROID , LEVOTHROID) 25 MCG tablet Take 25 mcg by mouth daily before breakfast.      methotrexate  (RHEUMATREX) 2.5 MG tablet Take 12.5 mg by mouth every Friday.      Multiple Minerals-Vitamins (CALCIUM-MAGNESIUM-ZINC-D3 PO) Take 1 tablet by mouth 2 (two) times daily.      Multiple Vitamin (MULTIVITAMIN WITH MINERALS) TABS tablet Take 1 tablet by mouth daily.     olmesartan (BENICAR) 20 MG tablet Take 20 mg by mouth daily.      omeprazole (PRILOSEC) 40 MG capsule Take 40 mg by mouth daily.     PARoxetine  (PAXIL ) 20 MG tablet Take 20 mg by mouth daily.     polyethylene glycol (MIRALAX / GLYCOLAX) packet Take 17 g by mouth daily.      Polyvinyl Alcohol -Povidone PF (REFRESH) 1.4-0.6 % SOLN Place 1 drop into both eyes daily.     Probiotic Product (PROBIOTIC DAILY PO) Take 1 capsule by mouth daily.     simvastatin  (ZOCOR ) 40 MG tablet Take  40 mg by mouth at bedtime.      TURMERIC PO Take 2 capsules by mouth daily.      No current facility-administered medications for this visit.    REVIEW OF SYSTEMS:   Constitutional: ( - ) fevers, ( - )  chills , ( - ) night sweats Eyes: ( - ) blurriness of vision, ( - ) double vision, ( - ) watery eyes Ears, nose, mouth, throat, and face: ( - ) mucositis, ( - ) sore throat Respiratory: ( - ) cough, ( - ) dyspnea, ( - ) wheezes Cardiovascular: ( - ) palpitation, ( - )  chest discomfort, ( - ) lower extremity swelling Gastrointestinal:  ( - ) nausea, ( - ) heartburn, ( - ) change in bowel habits Skin: ( - ) abnormal skin rashes Lymphatics: ( - ) new lymphadenopathy, ( - ) easy bruising Neurological: ( - ) numbness, ( - ) tingling, ( - ) new weaknesses Behavioral/Psych: ( - ) mood change, ( - ) new changes  All other systems were reviewed with the patient and are negative.  PHYSICAL EXAMINATION:  There were no vitals filed for this visit.  There were no vitals filed for this visit.   GENERAL: Well-appearing elderly Caucasian female, alert, no distress and comfortable SKIN: skin color, texture, turgor are normal, no rashes or significant lesions EYES: conjunctiva are pink and non-injected, sclera clear LUNGS: clear to auscultation and percussion with normal breathing effort HEART: regular rate & rhythm and no murmurs and no lower extremity edema Musculoskeletal: no cyanosis of digits and no clubbing  PSYCH: alert & oriented x 3, fluent speech NEURO: no focal motor/sensory deficits  LABORATORY DATA:  I have reviewed the data as listed    Latest Ref Rng & Units 08/25/2024    2:04 PM 08/11/2024    2:33 PM 07/28/2024   10:56 AM  CBC  WBC 4.0 - 10.5 K/uL 6.2  7.6  10.1   Hemoglobin 12.0 - 15.0 g/dL 89.3  89.3  89.5   Hematocrit 36.0 - 46.0 % 31.8  31.3  31.0   Platelets 150 - 400 K/uL 250  247  461        Latest Ref Rng & Units 08/25/2024    2:04 PM 08/11/2024    2:33 PM 07/28/2024    10:56 AM  CMP  Glucose 70 - 99 mg/dL 99  892  891   BUN 8 - 23 mg/dL 20  15  22    Creatinine 0.44 - 1.00 mg/dL 8.96  8.99  8.90   Sodium 135 - 145 mmol/L 133  132  135   Potassium 3.5 - 5.1 mmol/L 4.5  4.6  4.7   Chloride 98 - 111 mmol/L 100  98  103   CO2 22 - 32 mmol/L 28  27  27    Calcium 8.9 - 10.3 mg/dL 9.1  9.1  8.9   Total Protein 6.5 - 8.1 g/dL 6.9  6.6  6.7   Total Bilirubin 0.0 - 1.2 mg/dL 0.4  0.4  0.3   Alkaline Phos 38 - 126 U/L 80  78  66   AST 15 - 41 U/L 22  22  22    ALT 0 - 44 U/L 14  16  15       RADIOGRAPHIC STUDIES: No results found.  ASSESSMENT & PLAN Rhonda [name] is a 83 y.o. female with medical history significant for newly diagnosed essential thrombocytosis who presents for a follow up visit.  # Essential thrombocytosis, CALR Positive --Diagnosis confirmed by the presence of a CALR mutation as well as bone marrow biopsy showing abnormal megakaryocytes. --continue anagrelide  0.5 mg twice daily . -- Target platelets between 150 and 400.  --Labs today show *** -- Return to clinic in 4 weeks for labs and clinic visit.  No orders of the defined types were placed in this encounter.   All questions were answered. The patient knows to call the clinic with any problems, questions or concerns.   I have spent a total of 25 minutes minutes of face-to-face and non-face-to-face time, preparing to see the patient,  performing  a medically appropriate examination, counseling and educating the patient, documenting clinical information in the electronic health record, independently interpreting results and communicating results to the patient, and care coordination.   Johnston Police PA-C Dept of Hematology and Oncology [facility] Phone: [phone]   09/25/2024 11:52 PM

## 2024-09-26 ENCOUNTER — Other Ambulatory Visit (HOSPITAL_COMMUNITY): Payer: Self-pay

## 2024-09-26 ENCOUNTER — Inpatient Hospital Stay: Attending: Hematology and Oncology

## 2024-09-26 ENCOUNTER — Inpatient Hospital Stay: Admitting: Physician Assistant

## 2024-09-26 VITALS — BP 138/69 | HR 47 | Temp 98.2°F | Resp 15 | Wt 181.0 lb

## 2024-09-26 DIAGNOSIS — D473 Essential (hemorrhagic) thrombocythemia: Secondary | ICD-10-CM | POA: Insufficient documentation

## 2024-09-26 LAB — CMP (CANCER CENTER ONLY)
ALT: 14 U/L (ref 0–44)
AST: 21 U/L (ref 15–41)
Albumin: 4.3 g/dL (ref 3.5–5.0)
Alkaline Phosphatase: 70 U/L (ref 38–126)
Anion gap: 3 — ABNORMAL LOW (ref 5–15)
BUN: 20 mg/dL (ref 8–23)
CO2: 30 mmol/L (ref 22–32)
Calcium: 8.8 mg/dL — ABNORMAL LOW (ref 8.9–10.3)
Chloride: 100 mmol/L (ref 98–111)
Creatinine: 0.95 mg/dL (ref 0.44–1.00)
GFR, Estimated: 59 mL/min — ABNORMAL LOW (ref >60)
Glucose, Bld: 111 mg/dL — ABNORMAL HIGH (ref 70–99)
Potassium: 4.7 mmol/L (ref 3.5–5.1)
Sodium: 133 mmol/L — ABNORMAL LOW (ref 135–145)
Total Bilirubin: 0.4 mg/dL (ref 0.0–1.2)
Total Protein: 6.6 g/dL (ref 6.5–8.1)

## 2024-09-26 LAB — CBC WITH DIFFERENTIAL (CANCER CENTER ONLY)
Abs Immature Granulocytes: 0.13 K/uL — ABNORMAL HIGH (ref 0.00–0.07)
Basophils Absolute: 0.2 K/uL — ABNORMAL HIGH (ref 0.0–0.1)
Basophils Relative: 2 %
Eosinophils Absolute: 0 K/uL (ref 0.0–0.5)
Eosinophils Relative: 1 %
HCT: 29.5 % — ABNORMAL LOW (ref 36.0–46.0)
Hemoglobin: 9.7 g/dL — ABNORMAL LOW (ref 12.0–15.0)
Immature Granulocytes: 2 %
Lymphocytes Relative: 15 %
Lymphs Abs: 1.1 K/uL (ref 0.7–4.0)
MCH: 31.5 pg (ref 26.0–34.0)
MCHC: 32.9 g/dL (ref 30.0–36.0)
MCV: 95.8 fL (ref 80.0–100.0)
Monocytes Absolute: 1 K/uL (ref 0.1–1.0)
Monocytes Relative: 13 %
Neutro Abs: 5.3 K/uL (ref 1.7–7.7)
Neutrophils Relative %: 67 %
Platelet Count: 297 K/uL (ref 150–400)
RBC: 3.08 MIL/uL — ABNORMAL LOW (ref 3.87–5.11)
RDW: 15.9 % — ABNORMAL HIGH (ref 11.5–15.5)
WBC Count: 7.8 K/uL (ref 4.0–10.5)
nRBC: 0 % (ref 0.0–0.2)

## 2024-09-26 MED ORDER — ANAGRELIDE HCL 0.5 MG PO CAPS
0.5000 mg | ORAL_CAPSULE | Freq: Two times a day (BID) | ORAL | 3 refills | Status: AC
  Filled 2024-09-26 – 2024-10-20 (×2): qty 60, 30d supply, fill #0
  Filled 2024-11-17: qty 60, 30d supply, fill #1
  Filled 2024-12-12: qty 60, 30d supply, fill #2
  Filled 2025-01-11: qty 60, 30d supply, fill #3

## 2024-09-27 DIAGNOSIS — M810 Age-related osteoporosis without current pathological fracture: Secondary | ICD-10-CM | POA: Diagnosis not present

## 2024-10-02 ENCOUNTER — Other Ambulatory Visit: Payer: Self-pay | Admitting: Internal Medicine

## 2024-10-02 DIAGNOSIS — Z1231 Encounter for screening mammogram for malignant neoplasm of breast: Secondary | ICD-10-CM

## 2024-10-10 ENCOUNTER — Ambulatory Visit
Admission: RE | Admit: 2024-10-10 | Discharge: 2024-10-10 | Disposition: A | Discharge status: Home / Self Care | Source: Ambulatory Visit | Attending: Internal Medicine | Admitting: Internal Medicine

## 2024-10-10 DIAGNOSIS — Z1231 Encounter for screening mammogram for malignant neoplasm of breast: Secondary | ICD-10-CM

## 2024-10-11 ENCOUNTER — Inpatient Hospital Stay (HOSPITAL_COMMUNITY)

## 2024-10-11 ENCOUNTER — Other Ambulatory Visit: Payer: Self-pay

## 2024-10-11 ENCOUNTER — Emergency Department (HOSPITAL_COMMUNITY)

## 2024-10-11 ENCOUNTER — Encounter (HOSPITAL_COMMUNITY): Payer: Self-pay | Admitting: Internal Medicine

## 2024-10-11 ENCOUNTER — Inpatient Hospital Stay (HOSPITAL_COMMUNITY)
Admission: EM | Admit: 2024-10-11 | Discharge: 2024-10-14 | DRG: 286 | Disposition: A | Discharge status: Home / Self Care | Attending: Internal Medicine | Admitting: Internal Medicine

## 2024-10-11 DIAGNOSIS — I350 Nonrheumatic aortic (valve) stenosis: Secondary | ICD-10-CM

## 2024-10-11 DIAGNOSIS — R0602 Shortness of breath: Secondary | ICD-10-CM | POA: Diagnosis not present

## 2024-10-11 DIAGNOSIS — Z7983 Long term (current) use of bisphosphonates: Secondary | ICD-10-CM

## 2024-10-11 DIAGNOSIS — I493 Ventricular premature depolarization: Secondary | ICD-10-CM | POA: Diagnosis present

## 2024-10-11 DIAGNOSIS — M069 Rheumatoid arthritis, unspecified: Secondary | ICD-10-CM | POA: Diagnosis not present

## 2024-10-11 DIAGNOSIS — R6889 Other general symptoms and signs: Secondary | ICD-10-CM | POA: Diagnosis not present

## 2024-10-11 DIAGNOSIS — M19041 Primary osteoarthritis, right hand: Secondary | ICD-10-CM | POA: Diagnosis present

## 2024-10-11 DIAGNOSIS — R739 Hyperglycemia, unspecified: Secondary | ICD-10-CM | POA: Diagnosis present

## 2024-10-11 DIAGNOSIS — I5033 Acute on chronic diastolic (congestive) heart failure: Secondary | ICD-10-CM | POA: Diagnosis present

## 2024-10-11 DIAGNOSIS — Z79899 Other long term (current) drug therapy: Secondary | ICD-10-CM

## 2024-10-11 DIAGNOSIS — E785 Hyperlipidemia, unspecified: Secondary | ICD-10-CM | POA: Diagnosis present

## 2024-10-11 DIAGNOSIS — I509 Heart failure, unspecified: Secondary | ICD-10-CM

## 2024-10-11 DIAGNOSIS — M19012 Primary osteoarthritis, left shoulder: Secondary | ICD-10-CM | POA: Diagnosis present

## 2024-10-11 DIAGNOSIS — E66811 Obesity, class 1: Secondary | ICD-10-CM | POA: Diagnosis present

## 2024-10-11 DIAGNOSIS — Z886 Allergy status to analgesic agent status: Secondary | ICD-10-CM

## 2024-10-11 DIAGNOSIS — J9601 Acute respiratory failure with hypoxia: Principal | ICD-10-CM | POA: Diagnosis present

## 2024-10-11 DIAGNOSIS — F3281 Premenstrual dysphoric disorder: Secondary | ICD-10-CM | POA: Diagnosis not present

## 2024-10-11 DIAGNOSIS — I440 Atrioventricular block, first degree: Secondary | ICD-10-CM | POA: Diagnosis present

## 2024-10-11 DIAGNOSIS — M19042 Primary osteoarthritis, left hand: Secondary | ICD-10-CM | POA: Diagnosis present

## 2024-10-11 DIAGNOSIS — Z888 Allergy status to other drugs, medicaments and biological substances status: Secondary | ICD-10-CM

## 2024-10-11 DIAGNOSIS — E871 Hypo-osmolality and hyponatremia: Secondary | ICD-10-CM | POA: Diagnosis present

## 2024-10-11 DIAGNOSIS — N1831 Chronic kidney disease, stage 3a: Secondary | ICD-10-CM | POA: Diagnosis present

## 2024-10-11 DIAGNOSIS — I130 Hypertensive heart and chronic kidney disease with heart failure and stage 1 through stage 4 chronic kidney disease, or unspecified chronic kidney disease: Principal | ICD-10-CM | POA: Diagnosis present

## 2024-10-11 DIAGNOSIS — M549 Dorsalgia, unspecified: Secondary | ICD-10-CM | POA: Diagnosis not present

## 2024-10-11 DIAGNOSIS — I10 Essential (primary) hypertension: Secondary | ICD-10-CM | POA: Diagnosis not present

## 2024-10-11 DIAGNOSIS — K760 Fatty (change of) liver, not elsewhere classified: Secondary | ICD-10-CM | POA: Diagnosis present

## 2024-10-11 DIAGNOSIS — F32A Depression, unspecified: Secondary | ICD-10-CM | POA: Diagnosis present

## 2024-10-11 DIAGNOSIS — Z79631 Long term (current) use of antimetabolite agent: Secondary | ICD-10-CM

## 2024-10-11 DIAGNOSIS — I499 Cardiac arrhythmia, unspecified: Secondary | ICD-10-CM | POA: Diagnosis not present

## 2024-10-11 DIAGNOSIS — R918 Other nonspecific abnormal finding of lung field: Secondary | ICD-10-CM | POA: Diagnosis not present

## 2024-10-11 DIAGNOSIS — M19011 Primary osteoarthritis, right shoulder: Secondary | ICD-10-CM | POA: Diagnosis present

## 2024-10-11 DIAGNOSIS — D72829 Elevated white blood cell count, unspecified: Secondary | ICD-10-CM | POA: Diagnosis present

## 2024-10-11 DIAGNOSIS — I2510 Atherosclerotic heart disease of native coronary artery without angina pectoris: Secondary | ICD-10-CM | POA: Diagnosis not present

## 2024-10-11 DIAGNOSIS — D473 Essential (hemorrhagic) thrombocythemia: Secondary | ICD-10-CM | POA: Diagnosis present

## 2024-10-11 DIAGNOSIS — E039 Hypothyroidism, unspecified: Secondary | ICD-10-CM | POA: Diagnosis present

## 2024-10-11 DIAGNOSIS — Z96642 Presence of left artificial hip joint: Secondary | ICD-10-CM | POA: Diagnosis present

## 2024-10-11 DIAGNOSIS — J811 Chronic pulmonary edema: Secondary | ICD-10-CM | POA: Diagnosis not present

## 2024-10-11 DIAGNOSIS — Z6831 Body mass index (BMI) 31.0-31.9, adult: Secondary | ICD-10-CM

## 2024-10-11 DIAGNOSIS — I700 Atherosclerosis of aorta: Secondary | ICD-10-CM | POA: Diagnosis not present

## 2024-10-11 DIAGNOSIS — K219 Gastro-esophageal reflux disease without esophagitis: Secondary | ICD-10-CM | POA: Diagnosis not present

## 2024-10-11 DIAGNOSIS — Z803 Family history of malignant neoplasm of breast: Secondary | ICD-10-CM

## 2024-10-11 DIAGNOSIS — Z79890 Hormone replacement therapy (postmenopausal): Secondary | ICD-10-CM

## 2024-10-11 DIAGNOSIS — R7989 Other specified abnormal findings of blood chemistry: Secondary | ICD-10-CM | POA: Insufficient documentation

## 2024-10-11 LAB — ECHOCARDIOGRAM COMPLETE
AR max vel: 0.51 cm2
AV Area VTI: 0.44 cm2
AV Area mean vel: 0.42 cm2
AV Mean grad: 48 mmHg
AV Peak grad: 81 mmHg
Ao pk vel: 4.5 m/s
Area-P 1/2: 5.42 cm2
Calc EF: 53.8 %
Height: 63 in
MV M vel: 6.05 m/s
MV Peak grad: 146.4 mmHg
MV VTI: 2.48 cm2
P 1/2 time: 243 ms
S' Lateral: 3.9 cm
Single Plane A2C EF: 53.6 %
Single Plane A4C EF: 54.9 %
Weight: 2927.71 [oz_av]

## 2024-10-11 LAB — GLUCOSE, CAPILLARY
Glucose-Capillary: 97 mg/dL (ref 70–99)
Glucose-Capillary: 96 mg/dL (ref 70–99)

## 2024-10-11 LAB — COMPREHENSIVE METABOLIC PANEL WITH GFR
ALT: 30 U/L (ref 0–44)
AST: 41 U/L (ref 15–41)
Albumin: 3.4 g/dL — ABNORMAL LOW (ref 3.5–5.0)
Alkaline Phosphatase: 72 U/L (ref 38–126)
Anion gap: 12 (ref 5–15)
BUN: 19 mg/dL (ref 8–23)
CO2: 20 mmol/L — ABNORMAL LOW (ref 22–32)
Calcium: 8.3 mg/dL — ABNORMAL LOW (ref 8.9–10.3)
Chloride: 99 mmol/L (ref 98–111)
Creatinine, Ser: 1.08 mg/dL — ABNORMAL HIGH (ref 0.44–1.00)
GFR, Estimated: 51 mL/min — ABNORMAL LOW (ref >60)
Glucose, Bld: 206 mg/dL — ABNORMAL HIGH (ref 70–99)
Potassium: 4.6 mmol/L (ref 3.5–5.1)
Sodium: 131 mmol/L — ABNORMAL LOW (ref 135–145)
Total Bilirubin: 0.7 mg/dL (ref 0.0–1.2)
Total Protein: 6.4 g/dL — ABNORMAL LOW (ref 6.5–8.1)

## 2024-10-11 LAB — TROPONIN I (HIGH SENSITIVITY)
Troponin I (High Sensitivity): 9 ng/L (ref <18)
Troponin I (High Sensitivity): 26 ng/L — ABNORMAL HIGH (ref <18)
Troponin I (High Sensitivity): 38 ng/L — ABNORMAL HIGH (ref <18)
Troponin I (High Sensitivity): 53 ng/L — ABNORMAL HIGH (ref <18)

## 2024-10-11 LAB — D-DIMER, QUANTITATIVE: D-Dimer, Quant: 1.32 ug{FEU}/mL — ABNORMAL HIGH (ref 0.00–0.50)

## 2024-10-11 LAB — CBC
HCT: 30.8 % — ABNORMAL LOW (ref 36.0–46.0)
Hemoglobin: 9.9 g/dL — ABNORMAL LOW (ref 12.0–15.0)
MCH: 31.7 pg (ref 26.0–34.0)
MCHC: 32.1 g/dL (ref 30.0–36.0)
MCV: 98.7 fL (ref 80.0–100.0)
Platelets: 389 K/uL (ref 150–400)
RBC: 3.12 MIL/uL — ABNORMAL LOW (ref 3.87–5.11)
RDW: 17.2 % — ABNORMAL HIGH (ref 11.5–15.5)
WBC: 19.1 K/uL — ABNORMAL HIGH (ref 4.0–10.5)
nRBC: 0 % (ref 0.0–0.2)

## 2024-10-11 LAB — CBG MONITORING, ED: Glucose-Capillary: 97 mg/dL (ref 70–99)

## 2024-10-11 LAB — HEMOGLOBIN A1C
Hgb A1c MFr Bld: 5.6 % (ref 4.8–5.6)
Mean Plasma Glucose: 114.02 mg/dL

## 2024-10-11 LAB — BRAIN NATRIURETIC PEPTIDE: B Natriuretic Peptide: 201.5 pg/mL — ABNORMAL HIGH (ref 0.0–100.0)

## 2024-10-11 MED ORDER — INSULIN ASPART 100 UNIT/ML IJ SOLN: 0.0000 [IU] | Freq: Three times a day (TID) | INTRAMUSCULAR | Status: DC

## 2024-10-11 MED ORDER — FREE WATER
250.0000 mL | Freq: Once | Status: AC
  Administered 2024-10-12: 250 mL via ORAL

## 2024-10-11 MED ORDER — ENOXAPARIN SODIUM 40 MG/0.4ML IJ SOSY
40.0000 mg | PREFILLED_SYRINGE | INTRAMUSCULAR | Status: DC
  Administered 2024-10-11 – 2024-10-13 (×3): 40 mg via SUBCUTANEOUS
  Filled 2024-10-11 (×3): qty 0.4

## 2024-10-11 MED ORDER — FUROSEMIDE 10 MG/ML IJ SOLN
20.0000 mg | Freq: Once | INTRAMUSCULAR | Status: AC
  Administered 2024-10-11: 20 mg via INTRAVENOUS
  Filled 2024-10-11: qty 2

## 2024-10-11 MED ORDER — ONDANSETRON HCL 4 MG PO TABS: 4.0000 mg | ORAL_TABLET | Freq: Four times a day (QID) | ORAL | Status: DC | PRN

## 2024-10-11 MED ORDER — PERFLUTREN LIPID MICROSPHERE
1.0000 mL | INTRAVENOUS | Status: AC | PRN
  Administered 2024-10-11: 4 mL via INTRAVENOUS

## 2024-10-11 MED ORDER — ONDANSETRON HCL 4 MG/2ML IJ SOLN: 4.0000 mg | Freq: Four times a day (QID) | INTRAMUSCULAR | Status: DC | PRN

## 2024-10-11 MED ORDER — ASPIRIN 81 MG PO CHEW
81.0000 mg | CHEWABLE_TABLET | ORAL | Status: AC
  Administered 2024-10-12: 81 mg via ORAL
  Filled 2024-10-11: qty 1

## 2024-10-11 NOTE — Assessment & Plan Note (Deleted)
 Trop 30s-50s on presentation  No active chest pain  Suspect minimal to mild demand ischemia in setting of CHF  Trend  Baby ASA Prn NTG  Follow up cardiology recommendations

## 2024-10-11 NOTE — ED Provider Notes (Signed)
  Provider Note MRN:  [id number]  Arrival date & time: 10/11/24    ED Course and Medical Decision Making  Assumed care from Dr Theadore at shift change.  See note from prior team for complete details, in brief:  Clinical Course as of 10/11/24 0758  Wed Oct 11, 2024  0744 Handoff MB 83 yo/f Here w/ hypoxia, 70%'s, CPAP>BIPAP New HF Awaiting callback for admit [SG]    Clinical Course User Index [SG] Elnor Jayson LABOR, DO   Given lasix New HF?  Admit Dr Cherlyn    .Critical Care  Performed by: Elnor Jayson LABOR, DO Authorized by: Elnor Jayson LABOR, DO   Critical care provider statement:    Critical care time (minutes):  30   Critical care time was exclusive of:  Separately billable procedures and treating other patients   Critical care was necessary to treat or prevent imminent or life-threatening deterioration of the following conditions:  Respiratory failure   Critical care was time spent personally by me on the following activities:  Development of treatment plan with patient or surrogate, discussions with consultants, evaluation of patient's response to treatment, examination of patient, ordering and review of laboratory studies, ordering and review of radiographic studies, ordering and performing treatments and interventions, pulse oximetry, re-evaluation of patient's condition and review of old charts   Care discussed with: admitting provider     Final Clinical Impressions(s) / ED Diagnoses     ICD-10-CM   1. Acute respiratory failure with hypoxia [facility])  J96.01       ED Discharge Orders     None       Discharge Instructions   None        Elnor Jayson LABOR, DO 10/11/24 [phone]

## 2024-10-11 NOTE — Assessment & Plan Note (Signed)
 Continue levothyroxine 

## 2024-10-11 NOTE — ED Triage Notes (Signed)
 Pt BIB [facility] from home. Pt woke up out of sleep w/ SOB and back pain. On EMS arrival pt O2 was 70% RA, diaphoretic and pale; placed on CPAP and O2 at 95%.  2 SL nitroglycerin  160/90

## 2024-10-11 NOTE — Assessment & Plan Note (Addendum)
 Cell count has been stable Follow up with hematology as outpatient as scheduled Continue with anagrelide  and folic acid 

## 2024-10-11 NOTE — Assessment & Plan Note (Signed)
 Noted decompensated resp status now requiring BIPAP w/ volume overload on imaging  BNP 200 without known baseline  + cardiomegaly on imaging  S/p IV lasix in the ER- will continue  Will check 2D ECHO  Strict Is and Os and daily weights  Weight today 83kg  Consult cardiology  Follow up recommendations

## 2024-10-11 NOTE — Assessment & Plan Note (Addendum)
 Appears stable  On plaquenil  and methotrexate 

## 2024-10-11 NOTE — ED Notes (Signed)
 Pt remains 100% on 3L [place].  Hospitalist made aware.

## 2024-10-11 NOTE — Assessment & Plan Note (Addendum)
 Patient ruled out for diabetes mellitus  Hgb A1c 5.6

## 2024-10-11 NOTE — H&P (Addendum)
 History and Physical    Patient: Rhonda [name] FMW:[id number] DOB: 29-Nov-1941 DOA: 10/11/2024 DOS: the patient was seen and examined on 10/11/2024 PCP: Elliot Charm, MD  Patient coming from: Home  Chief Complaint:  Chief Complaint  Patient presents with   Shortness of Breath   HPI: Rhonda [name] is a 83 y.o. female with medical history significant of rheumatoid arthritis, essential thrombocytosis, GERD, hypertension presenting with acute respiratory failure with hypoxia, volume overload.  Patient reports progressive shortness of breath over the past 2 to 3 days.  Had acute worsening of shortness of breath overnight waking up with shortness of breath and increased work of breathing.  Patient denies any known history of symptoms like this in the past.  Has had orthopnea as well as lower extremity swelling.  No chest pain.  No focal hemiparesis or confusion.  No reported high salt or NSAID intake.  Noted baseline essential thrombocytosis.  Was recently started on anagrelide  for treatment within the past 6 months.  No belly pain or diarrhea.  Shortness of breath fairly constant both at rest as well as with exertion.  Does not check weight on a regular basis. Presented to the ER afebrile, heart rate 90s to 100s, respirations in the 20s.  Initially placed on BiPAP to help with respiratory demand.  White count 19.1, hemoglobin 9.9, platelets 389.  Troponin in the 20s.  Creatinine 1.08.  BNP 202.  Chest x-ray with diffuse interstitial edema and trace right pleural effusion.  Heart size upper limits of normal. Review of Systems: As mentioned in the history of present illness. All other systems reviewed and are negative. Past Medical History:  Diagnosis Date   Anxiety    pt. may have panicy feeling upon awaking from anesth. - states she feels anxious at times  & has used Paxil  & xanax in the [past but its been a very long time ago    Arthritis    rheumatoid & OA-hands, shoulders    Dyspnea     GERD (gastroesophageal reflux disease)    Heart murmur    Hypertension    Past Surgical History:  Procedure Laterality Date   APPENDECTOMY     laproscopic   BREAST BIOPSY Left 09/10/2023   MM LT BREAST BX W LOC DEV 1ST LESION IMAGE BX SPEC STEREO GUIDE 09/10/2023 [facility]   BREAST BIOPSY Left 09/10/2023   MM LT BREAST BX W LOC DEV EA AD LESION IMG BX SPEC STEREO GUIDE 09/10/2023 [facility]   BREAST EXCISIONAL BIOPSY Left    COLONOSCOPY     HEMORROIDECTOMY     INSERTION OF MESH N/A 09/12/2018   Procedure: INSERTION OF MESH;  Surgeon: Curvin Deward MOULD, MD;  Location: [facility] OR;  Service: General;  Laterality: N/A;   LAPAROSCOPIC APPENDECTOMY N/A 01/06/2017   Procedure: LAPAROSCOPIC APPENDECTOMY;  Surgeon: Deward Curvin III, MD;  Location: [facility] OR;  Service: General;  Laterality: N/A;   TONSILLECTOMY     as a child   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   Procedure: HIP ARTHROPLASTY ANTERIOR APPROACH;  Surgeon: Addie Cordella Hamilton, MD;  Location: [facility] OR;  Service: Orthopedics;  Laterality: Left;   VAGINAL DELIVERY     x2   VENTRAL HERNIA REPAIR N/A 09/12/2018   Procedure: LAPAROSCOPIC VENTRAL HERNIA REPAIR WITH MESH;  Surgeon: Curvin Deward MOULD, MD;  Location: [facility];  Service: General;  Laterality: N/A;   VIDEO BRONCHOSCOPY Bilateral 11/07/2019   Procedure: VIDEO BRONCHOSCOPY WITH FLUORO;  Surgeon: Shellia Oh, MD;  Location: [facility];  Service: Endoscopy;  Laterality: Bilateral;   Social History:  reports that she has never smoked. She has never used smokeless tobacco. She reports current alcohol  use. She reports that she does not use drugs.  Allergies  Allergen Reactions   Amlodipine  Other (See Comments)   Atorvastatin Other (See Comments)   Lisinopril Other (See Comments)   Meloxicam Other (See Comments)    Other Reaction(s): stomach upset, hives   Aleve [Naproxen] Other (See Comments)    GI upset    Family History  Problem Relation Age of Onset   Breast cancer  Daughter    Breast cancer Maternal Grandfather     Prior to Admission medications   Medication Sig Start Date End Date Taking? Authorizing Provider  acetaminophen  (TYLENOL ) 650 MG CR tablet Take 1,300 mg by mouth every 8 (eight) hours as needed for pain.   Yes [provider]  alendronate (FOSAMAX) 70 MG tablet Take 70 mg by mouth once a week. 10/06/24  Yes [provider]  amLODipine  (NORVASC ) 5 MG tablet Take 5 mg by mouth daily. Patient taking differently: Take 5 mg by mouth 2 (two) times daily. 10/20/19  Yes [provider]  anagrelide  (AGRYLIN) 0.5 MG capsule Take 1 capsule (0.5 mg total) by mouth 2 (two) times daily. 09/26/24  Yes Thayil, Irene T, PA-C  Carboxymethylcellulose Sodium (REFRESH LIQUIGEL) 1 % GEL Place 1 drop into both eyes at bedtime.   Yes [provider]  folic acid  (FOLVITE ) 800 MCG tablet Take 800 mcg by mouth daily.    Yes [provider]  hydroxychloroquine  (PLAQUENIL ) 200 MG tablet TAKE 1 TABLET BY MOUTH TWICE  DAILY 05/23/24  Yes Federico Norleen ONEIDA MADISON, MD  levothyroxine  (SYNTHROID , LEVOTHROID) 25 MCG tablet Take 25 mcg by mouth daily before breakfast.  09/16/16  Yes [provider]  methotrexate  (RHEUMATREX) 2.5 MG tablet Take 12.5 mg by mouth every Friday.  10/04/16  Yes [provider]  Multiple Minerals-Vitamins (CALCIUM-MAGNESIUM-ZINC-D3 PO) Take 1 tablet by mouth 2 (two) times daily.    Yes [provider]  Multiple Vitamin (MULTIVITAMIN WITH MINERALS) TABS tablet Take 1 tablet by mouth daily.   Yes [provider]  olmesartan (BENICAR) 20 MG tablet Take 20 mg by mouth daily.    Yes [provider]  omeprazole (PRILOSEC) 40 MG capsule Take 40 mg by mouth daily. 09/16/16  Yes [provider]  PARoxetine  (PAXIL ) 20 MG tablet Take 20 mg by mouth daily. 08/30/20  Yes [provider]  polyethylene glycol (MIRALAX / GLYCOLAX) packet Take 17 g by mouth daily.    Yes  [provider]  Polyvinyl Alcohol -Povidone PF (REFRESH) 1.4-0.6 % SOLN Place 1 drop into both eyes daily.   Yes [provider]  Probiotic Product (PROBIOTIC DAILY PO) Take 1 capsule by mouth daily.   Yes [provider]  simvastatin  (ZOCOR ) 40 MG tablet Take 40 mg by mouth at bedtime.  09/16/16  Yes [provider]    Physical Exam: Vitals:   10/11/24 0700 10/11/24 0732 10/11/24 0800 10/11/24 0900  BP: 118/67  (!) 117/39 103/77  Pulse: 95  95 94  Resp: (!) 24  (!) 25 (!) 23  Temp:      SpO2: 100% 100% 100% 100%  Weight:      Height:       Physical Exam Constitutional:      Appearance: She is normal weight.  HENT:     Head: Normocephalic and  atraumatic.     Nose: Nose normal.     Mouth/Throat:     Mouth: Mucous membranes are moist.  Eyes:     Pupils: Pupils are equal, round, and reactive to light.  Cardiovascular:     Rate and Rhythm: Normal rate and regular rhythm.  Pulmonary:     Effort: Pulmonary effort is normal.  Abdominal:     General: Bowel sounds are normal.  Musculoskeletal:        General: Normal range of motion.  Skin:    General: Skin is warm.  Neurological:     General: No focal deficit present.  Psychiatric:        Mood and Affect: Mood normal.     Data Reviewed:  There are no new results to review at this time.  DG Chest Port 1 View EXAM: 1 VIEW(S) XRAY OF THE CHEST 10/11/2024 06:36:14 AM  COMPARISON: PA and lateral radiographs of the chest dated 03/10/2023.  CLINICAL HISTORY: sob. Sob; rover.  FINDINGS:  LUNGS AND PLEURA: Diffuse interstitial prominence. Possible trace right pleural effusion. No focal pulmonary opacity. No pulmonary edema. No pneumothorax.  HEART AND MEDIASTINUM: Heart size is at the upper limits of normal. Aortic atherosclerosis. No other acute abnormality of the cardiac and mediastinal silhouettes.  BONES AND SOFT TISSUES: No acute osseous abnormality.  IMPRESSION: 1.  Diffuse interstitial prominence/edema and possible trace right pleural effusion. 2. Heart size at upper limits of normal. Aortic atherosclerosis.  Electronically signed by: Evalene Coho MD 10/11/2024 07:00 AM EDT RP Workstation: [id number]  Lab Results  Component Value Date   WBC 19.1 (H) 10/11/2024   HGB 9.9 (L) 10/11/2024   HCT 30.8 (L) 10/11/2024   MCV 98.7 10/11/2024   PLT 389 10/11/2024   Last metabolic panel Lab Results  Component Value Date   GLUCOSE 206 (H) 10/11/2024   NA 131 (L) 10/11/2024   K 4.6 10/11/2024   CL 99 10/11/2024   CO2 20 (L) 10/11/2024   BUN 19 10/11/2024   CREATININE 1.08 (H) 10/11/2024   GFRNONAA 51 (L) 10/11/2024   CALCIUM 8.3 (L) 10/11/2024   PROT 6.4 (L) 10/11/2024   ALBUMIN  3.4 (L) 10/11/2024   BILITOT 0.7 10/11/2024   ALKPHOS 72 10/11/2024   AST 41 10/11/2024   ALT 30 10/11/2024   ANIONGAP 12 10/11/2024    Assessment and Plan: * CHF (congestive heart failure) (HCC) Noted decompensated resp status now requiring BIPAP w/ volume overload on imaging  BNP 200 without known baseline  + cardiomegaly on imaging  S/p IV lasix in the ER- will continue  Will check 2D ECHO  Strict Is and Os and daily weights  Weight today 83kg  Consult cardiology  Follow up recommendations    Acute respiratory failure with hypoxia (HCC) Decompensated resp status now requiring NIPPV in setting of volume overload and concern for new onset CHF  Currently on BIPAP w/ 40% FIO2  CXR w/ interstitial edema and trace R pleural effusion  Symptomatically improving with IV lasix- continue  Will check 2D ECHO  No overt signs of infection noted  Will continue to monitor resp status w/ diuresis  Follow closely   Elevated troponin Trop 30s-50s on presentation  No active chest pain  Suspect minimal to mild demand ischemia in setting of CHF  Trend  Baby ASA Prn NTG  Follow up cardiology recommendations   Hyperglycemia Blood sugar in 200s  SSI  A1C   Monitor    Essential thrombocytosis (HCC) Noted baseline essential  thrombocytosis CALR positive  Followed by outpatient hem-onc w/ Dr. Federico  On anagrelide   Plt 389 today  Consider holding anagrelide  given major adverse reaction of heart failure    Hypothyroidism Check TSH in setting of decompensated resp status Cont synthroid   Monitor   Rheumatoid arthritis (HCC) Appears stable  On plaquenil  and methotrexate  Titrate use with concern for CHF    HTN (hypertension) BP stable  Titrate regimen with diuresis        Advance Care Planning:   Code Status: Prior   Consults: Cardiology   Family Communication: No family at the bedside   Severity of Illness: The appropriate patient status for this patient is OBSERVATION. Observation status is judged to be reasonable and necessary in order to provide the required intensity of service to ensure the patient's safety. The patient's presenting symptoms, physical exam findings, and initial radiographic and laboratory data in the context of their medical condition is felt to place them at decreased risk for further clinical deterioration. Furthermore, it is anticipated that the patient will be medically stable for discharge from the hospital within 2 midnights of admission.   Author: Elspeth JINNY Masters, MD 10/11/2024 9:28 AM  For on call review [email or web address].

## 2024-10-11 NOTE — ED Notes (Signed)
 Pt's O2 decreased to 2L NC.

## 2024-10-11 NOTE — Consult Note (Addendum)
 Cardiology Consultation   Patient ID: IDALY VERRET MRN: [id number]; DOB: 1941/05/16  Admit date: 10/11/2024 Date of Consult: 10/11/2024  PCP:  Elliot Charm, MD   [facility] Providers Cardiologist:  Lonni LITTIE Nanas, MD        Patient Profile: Rhonda [name] is a 83 y.o. female with a hx of essential thrombocytopenia, rheumatoid arthritis, GERD, hypothyroidism, hypertension, hyperlipidemia, and anxiety who is being seen 10/11/2024 for the evaluation of heart failure at the request of Garnette Masters, MD.  History of Present Illness: Ms. Rhonda [name] is a 83 year old female who per chart review has not been seen by cardiology in the past.  The patient has multivessel coronary calcifications that was seen on CT scan on 03/2020.  That CT scan also found evidence of hepatic steatosis with suspicion of cirrhosis, and aortic valve calcification  The patient is followed by hematology for essential thrombocytopenia.  This diagnosis was confirmed by bone marrow biopsy on 11/2023 and patient was on anagrelide  1 mg twice daily.  Patient presented to the emergency department for shortness of breath and back pain.  Patient was found to be in heart failure and was given dose of IV Lasix.  On interview patient reported that she has not been able to be as active as she was previously over the past 6 months because of worsening dyspnea on exertion.  Can only ride stationary bike for 5 minutes before she becomes too short of breath.  She is able to go shopping at the grocery store but gets significantly short of breath during that.  Over the past 3 to 4 days has had worsening shortness of breath at night and has had multiple episodes of PND.  Has also had some dizziness and felt like she was going to pass out.  Has had worsening lower extremity edema over the past week.  Stated that she has not had an echocardiogram in a long time.  Has some flank pain and back pain. Denies any  orthopnea, chest pain, nausea, vomiting, fever, and chills.  Denies tobacco use, cannabis use, illicit substance use.  Drinks about 2 glasses of wine a night.  Labs showed elevated high-sensitivity troponins 9> 26 > 38 > 53, elevated BNP of 201.5, hyponatremia of 131, decreased bicarb of 20, creatinine of 1.08, albumin  of 3.4, leukocytosis of 19.1, anemia with a hemoglobin of 9.9, and platelets of 389.  Chest x-ray showed Diffuse interstitial prominence/edema and possible trace right pleural effusion. Heart size at upper limits of normal. Aortic atherosclerosis  EKG showed sinus tachycardia with a rate of 102, 2 PVCs, and a first-degree AV block.  Past Medical History:  Diagnosis Date   Anxiety    pt. may have panicy feeling upon awaking from anesth. - states she feels anxious at times  & has used Paxil  & xanax in the [past but its been a very long time ago    Arthritis    rheumatoid & OA-hands, shoulders    Dyspnea    GERD (gastroesophageal reflux disease)    Heart murmur    Hypertension     Past Surgical History:  Procedure Laterality Date   APPENDECTOMY     laproscopic   BREAST BIOPSY Left 09/10/2023   MM LT BREAST BX W LOC DEV 1ST LESION IMAGE BX SPEC STEREO GUIDE 09/10/2023 [facility]   BREAST BIOPSY Left 09/10/2023   MM LT BREAST BX W LOC DEV EA AD LESION IMG BX SPEC STEREO GUIDE 09/10/2023 [facility]  BREAST EXCISIONAL BIOPSY Left    COLONOSCOPY     HEMORROIDECTOMY     INSERTION OF MESH N/A 09/12/2018   Procedure: INSERTION OF MESH;  Surgeon: Curvin Deward MOULD, MD;  Location: [facility] OR;  Service: General;  Laterality: N/A;   LAPAROSCOPIC APPENDECTOMY N/A 01/06/2017   Procedure: LAPAROSCOPIC APPENDECTOMY;  Surgeon: Deward Curvin III, MD;  Location: [facility] OR;  Service: General;  Laterality: N/A;   TONSILLECTOMY     as a child   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   Procedure: HIP ARTHROPLASTY ANTERIOR APPROACH;  Surgeon: Addie Cordella Hamilton, MD;  Location: [facility] OR;   Service: Orthopedics;  Laterality: Left;   VAGINAL DELIVERY     x2   VENTRAL HERNIA REPAIR N/A 09/12/2018   Procedure: LAPAROSCOPIC VENTRAL HERNIA REPAIR WITH MESH;  Surgeon: Curvin Deward MOULD, MD;  Location: [facility] OR;  Service: General;  Laterality: N/A;   VIDEO BRONCHOSCOPY Bilateral 11/07/2019   Procedure: VIDEO BRONCHOSCOPY WITH FLUORO;  Surgeon: Shellia Oh, MD;  Location: [facility] ENDOSCOPY;  Service: Endoscopy;  Laterality: Bilateral;     Home Medications:  Prior to Admission medications   Medication Sig Start Date End Date Taking? Authorizing Provider  acetaminophen  (TYLENOL ) 650 MG CR tablet Take 1,300 mg by mouth every 8 (eight) hours as needed for pain.   Yes [provider]  alendronate (FOSAMAX) 70 MG tablet Take 70 mg by mouth once a week. 10/06/24  Yes [provider]  amLODipine  (NORVASC ) 5 MG tablet Take 5 mg by mouth daily. Patient taking differently: Take 5 mg by mouth 2 (two) times daily. 10/20/19  Yes [provider]  anagrelide  (AGRYLIN) 0.5 MG capsule Take 1 capsule (0.5 mg total) by mouth 2 (two) times daily. 09/26/24  Yes Thayil, Irene T, PA-C  Carboxymethylcellulose Sodium (REFRESH LIQUIGEL) 1 % GEL Place 1 drop into both eyes at bedtime.   Yes [provider]  folic acid  (FOLVITE ) 800 MCG tablet Take 800 mcg by mouth daily.    Yes [provider]  hydroxychloroquine  (PLAQUENIL ) 200 MG tablet TAKE 1 TABLET BY MOUTH TWICE  DAILY 05/23/24  Yes Federico Norleen ONEIDA MADISON, MD  levothyroxine  (SYNTHROID , LEVOTHROID) 25 MCG tablet Take 25 mcg by mouth daily before breakfast.  09/16/16  Yes [provider]  methotrexate  (RHEUMATREX) 2.5 MG tablet Take 12.5 mg by mouth every Friday.  10/04/16  Yes [provider]  Multiple Minerals-Vitamins (CALCIUM-MAGNESIUM-ZINC-D3 PO) Take 1 tablet by mouth 2 (two) times daily.    Yes [provider]  Multiple Vitamin (MULTIVITAMIN WITH MINERALS) TABS tablet Take 1 tablet by mouth daily.   Yes  [provider]  olmesartan (BENICAR) 20 MG tablet Take 20 mg by mouth daily.    Yes [provider]  omeprazole (PRILOSEC) 40 MG capsule Take 40 mg by mouth daily. 09/16/16  Yes [provider]  PARoxetine  (PAXIL ) 20 MG tablet Take 20 mg by mouth daily. 08/30/20  Yes [provider]  polyethylene glycol (MIRALAX / GLYCOLAX) packet Take 17 g by mouth daily.    Yes [provider]  Polyvinyl Alcohol -Povidone PF (REFRESH) 1.4-0.6 % SOLN Place 1 drop into both eyes daily.   Yes [provider]  Probiotic Product (PROBIOTIC DAILY PO) Take 1 capsule by mouth daily.   Yes [provider]  simvastatin  (ZOCOR ) 40 MG tablet Take 40 mg by mouth at bedtime.  09/16/16  Yes [provider]    Scheduled Meds:  enoxaparin (LOVENOX) injection  40 mg Subcutaneous Q24H  insulin aspart  0-9 Units Subcutaneous TID WC   Continuous Infusions:  PRN Meds: ondansetron  **OR** ondansetron  (ZOFRAN ) IV  Allergies:    Allergies  Allergen Reactions   Amlodipine  Other (See Comments)   Atorvastatin Other (See Comments)   Lisinopril Other (See Comments)   Meloxicam Other (See Comments)    Other Reaction(s): stomach upset, hives   Aleve [Naproxen] Other (See Comments)    GI upset    Social History:   Social History   Socioeconomic History   Marital status: Married    Spouse name: Not on file   Number of children: Not on file   Years of education: Not on file   Highest education level: Not on file  Occupational History   Not on file  Tobacco Use   Smoking status: Never   Smokeless tobacco: Never  Substance and Sexual Activity   Alcohol  use: Yes    Comment: daily- wine & bourbon & ginger    Drug use: No   Sexual activity: Not on file  Other Topics Concern   Not on file  Social History Narrative   Not on file   Social Drivers of Health   Financial Resource Strain: Not on file  Food Insecurity: No Food Insecurity (10/11/2024)    Hunger Vital Sign    Worried About Running Out of Food in the Last Year: Never true    Ran Out of Food in the Last Year: Never true  Transportation Needs: No Transportation Needs (10/11/2024)   PRAPARE - Administrator, Civil Service (Medical): No    Lack of Transportation (Non-Medical): No  Physical Activity: Not on file  Stress: Not on file  Social Connections: Moderately Integrated (10/11/2024)   Social Connection and Isolation Panel    Frequency of Communication with Friends and Family: More than three times a week    Frequency of Social Gatherings with Friends and Family: More than three times a week    Attends Religious Services: 1 to 4 times per year    Active Member of [employer] or Organizations: No    Attends Banker Meetings: Never    Marital Status: Married  Catering manager Violence: Not At Risk (10/11/2024)   Humiliation, Afraid, Rape, and Kick questionnaire    Fear of Current or Ex-Partner: No    Emotionally Abused: No    Physically Abused: No    Sexually Abused: No    Family History:    Family History  Problem Relation Age of Onset   Breast cancer Daughter    Breast cancer Maternal Grandfather      ROS:  Please see the history of present illness.   All other ROS reviewed and negative.     Physical Exam/Data: Vitals:   10/11/24 1200 10/11/24 1230 10/11/24 1300 10/11/24 1400  BP: (!) 101/90 (!) 115/49 (!) 124/100 (!) 110/54  Pulse: 86 85 100 (!) 43  Resp: (!) 23 16    Temp:      TempSrc:      SpO2: 100% 100% 100% 100%  Weight:      Height:        Intake/Output Summary (Last 24 hours) at 10/11/2024 1517 Last data filed at 10/11/2024 1453 Gross per 24 hour  Intake --  Output 425 ml  Net -425 ml      10/11/2024    6:17 AM 09/26/2024   11:31 AM 08/25/2024    2:34 PM  Last 3 Weights  Weight (lbs) 182 lb 15.7  oz 181 lb 180 lb 3.2 oz  Weight (kg) 83 kg 82.101 kg 81.738 kg     Body mass index is 32.41 kg/m.  General:  Well  nourished, well developed, in no acute distress.  On 2 L nasal cannula HEENT: normal Neck: no JVD Vascular: No carotid bruits; Distal pulses 2+ bilaterally Cardiac: 2 out of 6 systolic murmur that radiates to the carotids. Lungs: Minimal wheezing heard throughout the lungs. Abd: soft, nontender, no hepatomegaly  Ext: no edema Musculoskeletal:  No deformities. Skin: warm and dry  Neuro:   no focal abnormalities noted Psych:  Normal affect   EKG:  The EKG was personally reviewed and demonstrates:  Sinus tachycardia with a rate of 102, 2 PVCs, and a first-degree AV block. Telemetry:  Telemetry was personally reviewed and demonstrates: Sinus tachycardia with heart rates in the 90s to 100 and a first-degree AV block  Relevant CV Studies: Echo pending  Laboratory Data: High Sensitivity Troponin:   Recent Labs  Lab 10/11/24 0615 10/11/24 0833 10/11/24 0952 10/11/24 1204  TROPONINIHS 9 26* 38* 53*     Chemistry Recent Labs  Lab 10/11/24 0615  NA 131*  K 4.6  CL 99  CO2 20*  GLUCOSE 206*  BUN 19  CREATININE 1.08*  CALCIUM 8.3*  GFRNONAA 51*  ANIONGAP 12    Recent Labs  Lab 10/11/24 0615  PROT 6.4*  ALBUMIN  3.4*  AST 41  ALT 30  ALKPHOS 72  BILITOT 0.7   Lipids No results for input(s): CHOL, TRIG, HDL, LABVLDL, LDLCALC, CHOLHDL in the last 168 hours.  Hematology Recent Labs  Lab 10/11/24 0615  WBC 19.1*  RBC 3.12*  HGB 9.9*  HCT 30.8*  MCV 98.7  MCH 31.7  MCHC 32.1  RDW 17.2*  PLT 389   Thyroid  No results for input(s): TSH, FREET4 in the last 168 hours.  BNP Recent Labs  Lab 10/11/24 0615  BNP 201.5*    DDimer  Recent Labs  Lab 10/11/24 9047  DDIMER 1.32*    Radiology/Studies:  DG Chest Port 1 View Result Date: 10/11/2024 EXAM: 1 VIEW(S) XRAY OF THE CHEST 10/11/2024 06:36:14 AM COMPARISON: PA and lateral radiographs of the chest dated 03/10/2023. CLINICAL HISTORY: sob. Sob; rover. FINDINGS: LUNGS AND PLEURA: Diffuse  interstitial prominence. Possible trace right pleural effusion. No focal pulmonary opacity. No pulmonary edema. No pneumothorax. HEART AND MEDIASTINUM: Heart size is at the upper limits of normal. Aortic atherosclerosis. No other acute abnormality of the cardiac and mediastinal silhouettes. BONES AND SOFT TISSUES: No acute osseous abnormality. IMPRESSION: 1. Diffuse interstitial prominence/edema and possible trace right pleural effusion. 2. Heart size at upper limits of normal. Aortic atherosclerosis. Electronically signed by: Evalene Coho MD 10/11/2024 07:00 AM EDT RP Workstation: [id number]     Assessment and Plan:  ADRIELLE POLAKOWSKI is a 83 y.o. female with a hx of essential thrombocytopenia, rheumatoid arthritis, GERD, hypothyroidism, hypertension, hyperlipidemia, and anxiety who is being seen 10/11/2024 for the evaluation of heart failure at the request of Garnette Masters, MD.  Heart failure Shortness of breath Dizziness Suspected aortic stenosis Has had progressively worsening dyspnea on exertion over the past 6 months. Over the past 3 to 4 days has had worsening shortness of breath at night and has had multiple episodes of PND.  Has also had some dizziness and felt like she was going to pass out.  Prior CT scan in 2021 showed multivessel coronary calcifications and aortic valve calcification. Telemetry showed sinus tachycardia with heart rates  in the 90s to 100s. - elevated high-sensitivity troponins 9> 26 > 38 > 53 - elevated BNP of 201.5. - Chest x-ray showed Diffuse interstitial prominence/edema and possible trace right pleural effusion Suspect these symptoms may be secondary to aortic stenosis. Continue IV Lasix 20 mg twice daily Echo pending   Coronary calcifications Hyperlipidemia Denies any chest pain. Seen on CT in 2021.  Prior to admission was on simvastatin  20 mg daily.  Has documented allergy to atorvastatin Order lipid panel.   Hypertension Prior to admission was on  amlodipine  5 mg twice daily, olmesartan 20 mg daily. Continue olmesartan 20mg  daily     Risk Assessment/Risk Scores:       New York  Heart Association (NYHA) Functional Class NYHA Class III       For questions or updates, please contact [facility] Please consult [email or web address] for contact info under     Signed, Morse Clause, PA-C  10/11/2024 3:17 PM   Patient seen and examined.  Agree with below documentation.  Rhonda [name] is an 83 year old female with history of rheumatoid arthritis, essential thrombocytosis, hypertension who we are consulted for evaluation of suspected heart failure by Dr. Nguyen.  She reports worsening shortness of breath over the last several days.  On presentation to ED, vitals notable for BP 122/48, pulse 105, SpO2 91%.  Labs notable for creatinine 1.08, sodium 131, BNP 202, WBC 19.1, hemoglobin 9.9, platelets 389, D-dimer 1.3, troponin 9 > 26 > 38 > 53.  EKG shows sinus tachycardia with PVCs, rate 102.  Chest x-ray with interstitial edema.  Echocardiogram shows EF 50 to 55%, normal RV function, severe left atrial enlargement, severe aortic stenosis.  On exam, patient is alert and oriented, regular rate and rhythm, 3/6 systolic murmur, bibasilar crackles, no LE edema.  On review of echo, AS appears critical (Vmax 5.5 m/s, MG , AVA 0.4 cm^2, DI 0.12).  Will plan LHC/RHC tomorrow and consult structural heart team for TAVR evaluation.  Risks and benefits of cardiac catheterization have been discussed with the patient.  These include bleeding, infection, kidney damage, stroke, heart attack, death.  The patient understands these risks and is willing to proceed.  Lonni LITTIE Nanas, MD

## 2024-10-11 NOTE — Assessment & Plan Note (Signed)
 SABRA

## 2024-10-11 NOTE — Plan of Care (Signed)

## 2024-10-11 NOTE — Assessment & Plan Note (Addendum)
 Will resume blood pressure control with low dose losartan Close follow up as outpatient.

## 2024-10-11 NOTE — ED Notes (Signed)
 Hospitalist at bedside

## 2024-10-11 NOTE — ED Notes (Signed)
 CCMD called.

## 2024-10-11 NOTE — ED Provider Notes (Signed)
 [facility] Emergency Department Provider Note MRN:  [id number]  Arrival date & time: 10/11/24     Chief Complaint   Shortness of Breath   History of Present Illness   Rhonda [name] is a 83 y.o. year-old female with a history of hypertension presenting to the ED with chief complaint of shortness of breath.  Acute shortness of breath this evening.  Woke up feeling very flushed and hot in the face.  Husband got her some cold cloth but was not helping.  Became more and more short of breath.  EMS found her to be hypoxic in the 70s on room air.  CPAP on the way here.  Review of Systems  A thorough review of systems was obtained and all systems are negative except as noted in the HPI and PMH.   Patient's Health History    Past Medical History:  Diagnosis Date   Anxiety    pt. may have panicy feeling upon awaking from anesth. - states she feels anxious at times  & has used Paxil  & xanax in the [past but its been a very long time ago    Arthritis    rheumatoid & OA-hands, shoulders    Dyspnea    GERD (gastroesophageal reflux disease)    Heart murmur    Hypertension     Past Surgical History:  Procedure Laterality Date   APPENDECTOMY     laproscopic   BREAST BIOPSY Left 09/10/2023   MM LT BREAST BX W LOC DEV 1ST LESION IMAGE BX SPEC STEREO GUIDE 09/10/2023 [facility]   BREAST BIOPSY Left 09/10/2023   MM LT BREAST BX W LOC DEV EA AD LESION IMG BX SPEC STEREO GUIDE 09/10/2023 [facility]   BREAST EXCISIONAL BIOPSY Left    COLONOSCOPY     HEMORROIDECTOMY     INSERTION OF MESH N/A 09/12/2018   Procedure: INSERTION OF MESH;  Surgeon: Curvin Deward MOULD, MD;  Location: [facility] OR;  Service: General;  Laterality: N/A;   LAPAROSCOPIC APPENDECTOMY N/A 01/06/2017   Procedure: LAPAROSCOPIC APPENDECTOMY;  Surgeon: Deward Curvin III, MD;  Location: [facility] OR;  Service: General;  Laterality: N/A;   TONSILLECTOMY     as a child   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    Procedure: HIP ARTHROPLASTY ANTERIOR APPROACH;  Surgeon: Addie Cordella Hamilton, MD;  Location: [facility] OR;  Service: Orthopedics;  Laterality: Left;   VAGINAL DELIVERY     x2   VENTRAL HERNIA REPAIR N/A 09/12/2018   Procedure: LAPAROSCOPIC VENTRAL HERNIA REPAIR WITH MESH;  Surgeon: Curvin Deward MOULD, MD;  Location: [facility] OR;  Service: General;  Laterality: N/A;   VIDEO BRONCHOSCOPY Bilateral 11/07/2019   Procedure: VIDEO BRONCHOSCOPY WITH FLUORO;  Surgeon: Shellia Oh, MD;  Location: [facility];  Service: Endoscopy;  Laterality: Bilateral;    Family History  Problem Relation Age of Onset   Breast cancer Daughter    Breast cancer Maternal Grandfather     Social History   Socioeconomic History   Marital status: Married    Spouse name: Not on file   Number of children: Not on file   Years of education: Not on file   Highest education level: Not on file  Occupational History   Not on file  Tobacco Use   Smoking status: Never   Smokeless tobacco: Never  Substance and Sexual Activity   Alcohol  use: Yes    Comment: daily- wine & bourbon & ginger    Drug use: No   Sexual  activity: Not on file  Other Topics Concern   Not on file  Social History Narrative   Not on file   Social Drivers of Health   Financial Resource Strain: Not on file  Food Insecurity: Not on file  Transportation Needs: Not on file  Physical Activity: Not on file  Stress: Not on file  Social Connections: Not on file  Intimate Partner Violence: Not on file     Physical Exam   Vitals:   10/11/24 0621 10/11/24 0700  BP: 103/67 118/67  Pulse: 100 95  Resp: (!) 21 (!) 24  Temp:    SpO2: 100% 100%    CONSTITUTIONAL: Well-appearing, moderate respiratory distress NEURO/PSYCH:  Alert and oriented x 3, no focal deficits EYES:  eyes equal and reactive ENT/NECK:  no LAD, no JVD CARDIO: Regular rate, well-perfused, normal S1 and S2 PULM: Scattered rhonchi GI/GU:  non-distended, non-tender MSK/SPINE:  No gross  deformities, no edema SKIN:  no rash, atraumatic   *Additional and/or pertinent findings included in MDM below  Diagnostic and Interventional Summary    EKG Interpretation Date/Time:  Wednesday October 11 2024 06:35:05 EDT Ventricular Rate:  102 PR Interval:  148 QRS Duration:  112 QT Interval:  363 QTC Calculation: 473 R Axis:   0  Text Interpretation: Sinus tachycardia Paired ventricular premature complexes Borderline intraventricular conduction delay Confirmed by Theadore Sharper [phone]) on 10/11/2024 7:21:09 AM       Labs Reviewed  CBC - Abnormal; Notable for the following components:      Result Value   WBC 19.1 (*)    RBC 3.12 (*)    Hemoglobin 9.9 (*)    HCT 30.8 (*)    RDW 17.2 (*)    All other components within normal limits  COMPREHENSIVE METABOLIC PANEL WITH GFR - Abnormal; Notable for the following components:   Sodium 131 (*)    CO2 20 (*)    Glucose, Bld 206 (*)    Creatinine, Ser 1.08 (*)    Calcium 8.3 (*)    Total Protein 6.4 (*)    Albumin  3.4 (*)    GFR, Estimated 51 (*)    All other components within normal limits  BRAIN NATRIURETIC PEPTIDE - Abnormal; Notable for the following components:   B Natriuretic Peptide 201.5 (*)    All other components within normal limits  TROPONIN I (HIGH SENSITIVITY)    DG Chest Port 1 View  Final Result      Medications - No data to display   Procedures  /  Critical Care .Critical Care  Performed by: Theadore Sharper HERO, MD Authorized by: Theadore Sharper HERO, MD   Critical care provider statement:    Critical care time (minutes):  45   Critical care was necessary to treat or prevent imminent or life-threatening deterioration of the following conditions:  Respiratory failure   Critical care was time spent personally by me on the following activities:  Development of treatment plan with patient or surrogate, discussions with consultants, evaluation of patient's response to treatment, examination of patient, ordering  and review of laboratory studies, ordering and review of radiographic studies, ordering and performing treatments and interventions, pulse oximetry, re-evaluation of patient's condition and review of old charts   ED Course and Medical Decision Making  Initial Impression and Ddx Acute hypoxic respiratory failure, does not have a history of asthma or COPD or CHF.  Seems to be much improved on CPAP with EMS.  Transition to BiPAP here.  Not having  any chest pain currently, was complaining of some back pain with EMS but this has resolved.  Was hypertensive with EMS but blood pressure has since normalized.  Past medical/surgical history that increases complexity of ED encounter: Hypertension  Interpretation of Diagnostics I personally reviewed the EKG and my interpretation is as follows: Sinus rhythm  No significant blood count or electrolyte disturbance.  Elevated BNP, leukocytosis, chest x-ray showing interstitial prominence  Patient Reassessment and Ultimate Disposition/Management     Requesting hospitalist admission, favoring new onset heart failure.  Overall doubt PE.  Patient management required discussion with the following services or consulting groups:  Hospitalist Service  Complexity of Problems Addressed Acute illness or injury that poses threat of life of bodily function  Additional Data Reviewed and Analyzed Further history obtained from: Further history from spouse/family member  Additional Factors Impacting ED Encounter Risk Consideration of hospitalization  Ozell HERO. Theadore, MD [facility] [email or web address]  Final Clinical Impressions(s) / ED Diagnoses     ICD-10-CM   1. Acute respiratory failure with hypoxia (HCC)  J96.01       ED Discharge Orders     None        Discharge Instructions Discussed with and Provided to Patient:   Discharge Instructions   None      Theadore Ozell HERO, MD 10/11/24 [phone]

## 2024-10-11 NOTE — ED Notes (Signed)
 Pt requesting to be taken off BiPap and trial nasal cannula.  Pt placed on 3L [place] and oxygen is remaining at 100%.  Pt noted to get SOB/labored w/ exertion.

## 2024-10-12 ENCOUNTER — Encounter (HOSPITAL_COMMUNITY)
Admission: EM | Disposition: A | Discharge status: Home / Self Care | Payer: Self-pay | Source: Home / Self Care | Attending: Internal Medicine

## 2024-10-12 ENCOUNTER — Encounter (HOSPITAL_COMMUNITY): Payer: Self-pay | Admitting: Cardiovascular Disease

## 2024-10-12 DIAGNOSIS — M069 Rheumatoid arthritis, unspecified: Secondary | ICD-10-CM | POA: Diagnosis not present

## 2024-10-12 DIAGNOSIS — I10 Essential (primary) hypertension: Secondary | ICD-10-CM | POA: Diagnosis not present

## 2024-10-12 DIAGNOSIS — E039 Hypothyroidism, unspecified: Secondary | ICD-10-CM | POA: Diagnosis not present

## 2024-10-12 DIAGNOSIS — I5033 Acute on chronic diastolic (congestive) heart failure: Secondary | ICD-10-CM | POA: Diagnosis not present

## 2024-10-12 DIAGNOSIS — I2510 Atherosclerotic heart disease of native coronary artery without angina pectoris: Secondary | ICD-10-CM | POA: Diagnosis not present

## 2024-10-12 DIAGNOSIS — E66811 Obesity, class 1: Secondary | ICD-10-CM

## 2024-10-12 DIAGNOSIS — R739 Hyperglycemia, unspecified: Secondary | ICD-10-CM

## 2024-10-12 DIAGNOSIS — I350 Nonrheumatic aortic (valve) stenosis: Secondary | ICD-10-CM | POA: Diagnosis not present

## 2024-10-12 HISTORY — PX: RIGHT HEART CATH AND CORONARY ANGIOGRAPHY: CATH118264

## 2024-10-12 LAB — POCT I-STAT EG7
Acid-Base Excess: 0 mmol/L (ref 0.0–2.0)
Acid-base deficit: 5 mmol/L — ABNORMAL HIGH (ref 0.0–2.0)
Bicarbonate: 21 mmol/L (ref 20.0–28.0)
Bicarbonate: 25.5 mmol/L (ref 20.0–28.0)
Calcium, Ion: 1.01 mmol/L — ABNORMAL LOW (ref 1.15–1.40)
Calcium, Ion: 1.17 mmol/L (ref 1.15–1.40)
HCT: 27 % — ABNORMAL LOW (ref 36.0–46.0)
HCT: 27 % — ABNORMAL LOW (ref 36.0–46.0)
Hemoglobin: 9.2 g/dL — ABNORMAL LOW (ref 12.0–15.0)
Hemoglobin: 9.2 g/dL — ABNORMAL LOW (ref 12.0–15.0)
O2 Saturation: 53 %
O2 Saturation: 60 %
Potassium: 3.6 mmol/L (ref 3.5–5.1)
Potassium: 4.2 mmol/L (ref 3.5–5.1)
Sodium: 127 mmol/L — ABNORMAL LOW (ref 135–145)
Sodium: 131 mmol/L — ABNORMAL LOW (ref 135–145)
TCO2: 22 mmol/L (ref 22–32)
TCO2: 27 mmol/L (ref 22–32)
pCO2, Ven: 40.7 mmHg — ABNORMAL LOW (ref 44–60)
pCO2, Ven: 43.2 mmHg — ABNORMAL LOW (ref 44–60)
pH, Ven: 7.32 (ref 7.25–7.43)
pH, Ven: 7.378 (ref 7.25–7.43)
pO2, Ven: 30 mmHg — CL (ref 32–45)
pO2, Ven: 32 mmHg (ref 32–45)

## 2024-10-12 LAB — COMPREHENSIVE METABOLIC PANEL WITH GFR
ALT: 23 U/L (ref 0–44)
AST: 25 U/L (ref 15–41)
Albumin: 3.1 g/dL — ABNORMAL LOW (ref 3.5–5.0)
Alkaline Phosphatase: 58 U/L (ref 38–126)
Anion gap: 10 (ref 5–15)
BUN: 18 mg/dL (ref 8–23)
CO2: 25 mmol/L (ref 22–32)
Calcium: 8.4 mg/dL — ABNORMAL LOW (ref 8.9–10.3)
Chloride: 98 mmol/L (ref 98–111)
Creatinine, Ser: 1.03 mg/dL — ABNORMAL HIGH (ref 0.44–1.00)
GFR, Estimated: 54 mL/min — ABNORMAL LOW (ref >60)
Glucose, Bld: 105 mg/dL — ABNORMAL HIGH (ref 70–99)
Potassium: 4.3 mmol/L (ref 3.5–5.1)
Sodium: 133 mmol/L — ABNORMAL LOW (ref 135–145)
Total Bilirubin: 0.5 mg/dL (ref 0.0–1.2)
Total Protein: 5.9 g/dL — ABNORMAL LOW (ref 6.5–8.1)

## 2024-10-12 LAB — GLUCOSE, CAPILLARY
Glucose-Capillary: 112 mg/dL — ABNORMAL HIGH (ref 70–99)
Glucose-Capillary: 107 mg/dL — ABNORMAL HIGH (ref 70–99)
Glucose-Capillary: 94 mg/dL (ref 70–99)
Glucose-Capillary: 107 mg/dL — ABNORMAL HIGH (ref 70–99)

## 2024-10-12 LAB — LIPID PANEL
Cholesterol: 113 mg/dL (ref 0–200)
HDL: 59 mg/dL (ref >40)
LDL Cholesterol: 45 mg/dL (ref 0–99)
Total CHOL/HDL Ratio: 1.9 ratio
Triglycerides: 45 mg/dL (ref <150)
VLDL: 9 mg/dL (ref 0–40)

## 2024-10-12 LAB — CBC
HCT: 27.6 % — ABNORMAL LOW (ref 36.0–46.0)
Hemoglobin: 9.1 g/dL — ABNORMAL LOW (ref 12.0–15.0)
MCH: 31.6 pg (ref 26.0–34.0)
MCHC: 33 g/dL (ref 30.0–36.0)
MCV: 95.8 fL (ref 80.0–100.0)
Platelets: 275 K/uL (ref 150–400)
RBC: 2.88 MIL/uL — ABNORMAL LOW (ref 3.87–5.11)
RDW: 17.2 % — ABNORMAL HIGH (ref 11.5–15.5)
WBC: 13.9 K/uL — ABNORMAL HIGH (ref 4.0–10.5)
nRBC: 0 % (ref 0.0–0.2)

## 2024-10-12 LAB — MAGNESIUM: Magnesium: 1.9 mg/dL (ref 1.7–2.4)

## 2024-10-12 SURGERY — RIGHT HEART CATH AND CORONARY ANGIOGRAPHY
Anesthesia: LOCAL

## 2024-10-12 MED ORDER — FUROSEMIDE 10 MG/ML IJ SOLN
40.0000 mg | Freq: Every day | INTRAMUSCULAR | Status: DC
  Administered 2024-10-12: 40 mg via INTRAVENOUS
  Filled 2024-10-12: qty 4

## 2024-10-12 MED ORDER — SODIUM CHLORIDE 0.9 % IV SOLN: 250.0000 mL | INTRAVENOUS | Status: AC | PRN

## 2024-10-12 MED ORDER — FREE WATER
500.0000 mL | Freq: Once | Status: AC
  Administered 2024-10-12: 500 mL via ORAL

## 2024-10-12 MED ORDER — VERAPAMIL HCL 2.5 MG/ML IV SOLN
INTRAVENOUS | Status: AC
  Filled 2024-10-12: qty 2

## 2024-10-12 MED ORDER — LIDOCAINE HCL (PF) 1 % IJ SOLN
INTRAMUSCULAR | Status: AC
  Filled 2024-10-12: qty 30

## 2024-10-12 MED ORDER — FENTANYL CITRATE (PF) 100 MCG/2ML IJ SOLN
INTRAMUSCULAR | Status: AC
  Filled 2024-10-12: qty 2

## 2024-10-12 MED ORDER — VERAPAMIL HCL 2.5 MG/ML IV SOLN
INTRAVENOUS | Status: DC | PRN
  Administered 2024-10-12: 10 mL via INTRA_ARTERIAL

## 2024-10-12 MED ORDER — MIDAZOLAM HCL (PF) 2 MG/2ML IJ SOLN
INTRAMUSCULAR | Status: DC | PRN
  Administered 2024-10-12: 1 mg via INTRAVENOUS

## 2024-10-12 MED ORDER — SODIUM CHLORIDE 0.9% FLUSH
3.0000 mL | Freq: Two times a day (BID) | INTRAVENOUS | Status: DC
  Administered 2024-10-12 – 2024-10-14 (×5): 3 mL via INTRAVENOUS

## 2024-10-12 MED ORDER — IOHEXOL 350 MG/ML SOLN
INTRAVENOUS | Status: DC | PRN
  Administered 2024-10-12: 30 mL

## 2024-10-12 MED ORDER — SODIUM CHLORIDE 0.9% FLUSH: 3.0000 mL | INTRAVENOUS | Status: DC | PRN

## 2024-10-12 MED ORDER — HYDRALAZINE HCL 20 MG/ML IJ SOLN: 10.0000 mg | INTRAMUSCULAR | Status: AC | PRN

## 2024-10-12 MED ORDER — FENTANYL CITRATE (PF) 100 MCG/2ML IJ SOLN
INTRAMUSCULAR | Status: DC | PRN
  Administered 2024-10-12: 25 ug via INTRAVENOUS

## 2024-10-12 MED ORDER — HEPARIN (PORCINE) IN NACL 1000-0.9 UT/500ML-% IV SOLN
INTRAVENOUS | Status: DC | PRN
  Administered 2024-10-12 (×2): 500 mL

## 2024-10-12 MED ORDER — LIDOCAINE HCL (PF) 1 % IJ SOLN
INTRAMUSCULAR | Status: DC | PRN
Start: 2024-10-12 — End: 2024-10-12
  Administered 2024-10-12: 5 mL via INTRADERMAL
  Administered 2024-10-12: 2 mL via INTRADERMAL

## 2024-10-12 MED ORDER — MIDAZOLAM HCL 2 MG/2ML IJ SOLN
INTRAMUSCULAR | Status: AC
  Filled 2024-10-12: qty 2

## 2024-10-12 SURGICAL SUPPLY — 14 items
CATH 5FR JL3.5 JR4 ANG PIG MP (CATHETERS) IMPLANT
CATH BALLN WEDGE 5F 110CM (CATHETERS) IMPLANT
CLOSURE MYNX CONTROL 5F (Vascular Products) IMPLANT
DEVICE RAD COMP TR BAND LRG (VASCULAR PRODUCTS) IMPLANT
GLIDESHEATH SLEND SS 6F .021 (SHEATH) IMPLANT
GUIDEWIRE .025 260CM (WIRE) IMPLANT
GUIDEWIRE INQWIRE 1.5J.035X260 (WIRE) IMPLANT
GUIDEWIRE TIGER .035X300 (WIRE) IMPLANT
PACK CARDIAC CATHETERIZATION (CUSTOM PROCEDURE TRAY) ×1 IMPLANT
SET ATX-X65L (MISCELLANEOUS) IMPLANT
SHEATH GLIDE SLENDER 4/5FR (SHEATH) IMPLANT
SHEATH PINNACLE 5F 10CM (SHEATH) IMPLANT
SHEATH PROBE COVER 6X72 (BAG) IMPLANT
WIRE MICRO SET SILHO 5FR 7 (SHEATH) IMPLANT

## 2024-10-12 NOTE — Progress Notes (Signed)
  Progress Note   Patient: Rhonda [name] FMW:[id number] DOB: [birth year] DOA: 10/11/2024     1 DOS: the patient was seen and examined on 10/12/2024   Brief hospital course: Rhonda [name] was admitted to the hospital with the working diagnosis of heart failure exacerbation.   83 yo female with the past medical history of rheumatoid arthritis, hypertension, thrombocytosis, who presented with dyspnea. Reported worsening symptoms for the last 2 to 3 days prior to admission, associated with orthopnea and lower extremity edema. The night prior to admission had a severe episode of PND, EMS was called and she was found in respiratory distress with a 02 saturation of 70% on room air. She was placed on Cpap and transported to the ED.  On her initial physical examination her blood pressure was 118/67, HR 95, RR 2 and 02 saturation 100% on supplemental 02 per [place]  Lungs with no wheezing or rhonchi, heart with S1 and S2 present and regular with no gallops or murmurs, abdomen with no distention and positive lower extremity edema.   Assessment and Plan: * Acute on chronic diastolic CHF (congestive heart failure) (HCC) Echocardiogram with preserved LV systolic function with EF 50 to 55%, mild dilatated LV cavity, mild LVH, mild left LVH, basal septal segment. RV systolic function preserved, mild mitral valve regurgitation, severe aortic valve stenosis.  Hypokinesis of the distal septum and apex.   Urine output is 825 ml  Systolic blood pressure 110 mmHg   Continue diuresis with furosemide IV Possible addition of MRA Follow up on cardiac catheterization   Acute respiratory failure with hypoxia (HCC)    HTN (hypertension) Continue blood pressure monitoring    Hypothyroidism Continue levothyroxine    Rheumatoid arthritis (HCC) Appears stable  On plaquenil  and methotrexate  Titrate use with concern for CHF    Essential thrombocytosis (HCC) Follow up cell count    Hyperglycemia Hgb A1c 5.6     Obesity, class 1 Calculated BMI is 31.7      Subjective: Patient with no chest pain, dyspnea is improving, no nausea or vomiting   Physical Exam: Vitals:   10/11/24 2000 10/11/24 2037 10/12/24 0011 10/12/24 0352  BP:  114/65 (!) 110/52 105/60  Pulse: 99 90 93 84  Resp:  20 18 18   Temp:  98.6 F (37 C) 98.1 F (36.7 C) 98.5 F (36.9 C)  TempSrc:  Oral Oral Oral  SpO2: 98% 98% 96% 98%  Weight:   78.7 kg   Height:       Neurology awake and alert ENT with mild pallor Cardiovascular with S1 and S2 present and regular, positive systolic murmur at the base, crescendo decrescendo  Respiratory with mild rales at bases with no wheezing or rhonchi  Abdomen with no distention  Lower extremity edema +  Data Reviewed:    Family Communication: no family at the bedside.   Disposition: Status is: Inpatient Remains inpatient appropriate because: diuresis   Planned Discharge Destination: Home     Author: Elidia Toribio Furnace, MD 10/12/2024 11:32 AM  For on call review [email or web address].

## 2024-10-12 NOTE — TOC CM/SW Note (Addendum)
 Transition of Care [facility]) - Inpatient Brief Assessment   Patient Details  Name: Rhonda [name] MRN: [id number] Date of Birth: Dec 24, 1941  [facility] CM/SW Contact:    Waddell Barnie Rama, RN Phone Number: 10/12/2024, 9:42 AM   Clinical Narrative: From home with spouse, has PCP and insurance on file, states has no HH services in place at this time or DME at home.  States family member (daughters)  will transport them home at [employer] and family is support system, states gets medications from [facility] in [place].  Pta self ambulatory.   There are no ICM needs identified  at this time.  Please place consult for any ICM  needs.  Patient scheduled for heart cath today.  Patient states does not need HH services.    Transition of Care Asessment: Insurance and Status: Insurance coverage has been reviewed Patient has primary care physician: Yes Home environment has been reviewed: home with spouse Prior level of function:: indep Prior/Current Home Services: No current home services Social Drivers of Health Review: SDOH reviewed no interventions necessary Readmission risk has been reviewed: Yes Transition of care needs: no transition of care needs at this time

## 2024-10-12 NOTE — Progress Notes (Signed)
 Rounding Note   Patient Name: Rhonda [name] Date of Encounter: 10/12/2024  [facility] Cardiologist: Rhonda LITTIE Nanas, MD   Subjective BP 105/60.  Creatinine stable at 1.03.  Net -575 cc yesterday but also with unmeasured UOP.  Reports dyspnea improved.  She denies any chest pain.  Scheduled Meds:  aspirin   81 mg Oral Pre-Cath   enoxaparin (LOVENOX) injection  40 mg Subcutaneous Q24H   insulin aspart  0-9 Units Subcutaneous TID WC   Continuous Infusions:  PRN Meds: ondansetron  **OR** ondansetron  (ZOFRAN ) IV   Vital Signs  Vitals:   10/11/24 2000 10/11/24 2037 10/12/24 0011 10/12/24 0352  BP:  114/65 (!) 110/52 105/60  Pulse: 99 90 93 84  Resp:  20 18 18   Temp:  98.6 F (37 C) 98.1 F (36.7 C) 98.5 F (36.9 C)  TempSrc:  Oral Oral Oral  SpO2: 98% 98% 96% 98%  Weight:   78.7 kg   Height:        Intake/Output Summary (Last 24 hours) at 10/12/2024 0903 Last data filed at 10/12/2024 0442 Gross per 24 hour  Intake 250 ml  Output 825 ml  Net -575 ml      10/12/2024   12:11 AM 10/11/2024    5:07 PM 10/11/2024    6:17 AM  Last 3 Weights  Weight (lbs) 173 lb 9.6 oz 174 lb 4.8 oz 182 lb 15.7 oz  Weight (kg) 78.744 kg 79.062 kg 83 kg      Telemetry NSR WITH PACS, NSVT x 9 beats - Personally Reviewed  ECG  No new ECG - Personally Reviewed  Physical Exam  GEN: No acute distress.   Neck: No JVD Cardiac: Irregular, 3 out of 6 systolic murmur Respiratory: Clear to auscultation bilaterally. GI: Soft, nontender, non-distended  MS: No edema Neuro:  Nonfocal  Psych: Normal affect   Labs High Sensitivity Troponin:   Recent Labs  Lab 10/11/24 0615 10/11/24 0833 10/11/24 0952 10/11/24 1204  TROPONINIHS 9 26* 38* 53*     Chemistry Recent Labs  Lab 10/11/24 0615 10/12/24 0245  NA 131* 133*  K 4.6 4.3  CL 99 98  CO2 20* 25  GLUCOSE 206* 105*  BUN 19 18  CREATININE 1.08* 1.03*  CALCIUM 8.3* 8.4*  MG  --  1.9  PROT 6.4* 5.9*   ALBUMIN  3.4* 3.1*  AST 41 25  ALT 30 23  ALKPHOS 72 58  BILITOT 0.7 0.5  GFRNONAA 51* 54*  ANIONGAP 12 10    Lipids  Recent Labs  Lab 10/12/24 0245  CHOL 113  TRIG 45  HDL 59  LDLCALC 45  CHOLHDL 1.9    Hematology Recent Labs  Lab 10/11/24 0615 10/12/24 0245  WBC 19.1* 13.9*  RBC 3.12* 2.88*  HGB 9.9* 9.1*  HCT 30.8* 27.6*  MCV 98.7 95.8  MCH 31.7 31.6  MCHC 32.1 33.0  RDW 17.2* 17.2*  PLT 389 275   Thyroid  No results for input(s): TSH, FREET4 in the last 168 hours.  BNP Recent Labs  Lab 10/11/24 0615  BNP 201.5*    DDimer  Recent Labs  Lab 10/11/24 0952  DDIMER 1.32*     Radiology  ECHOCARDIOGRAM COMPLETE Result Date: 10/11/2024    ECHOCARDIOGRAM REPORT   Patient Name:   [name] Date of Exam: 10/11/2024 Medical Rec #:  [id number]      Height:       63.0 in Accession #:    [id number]     Weight:  183.0 lb Date of Birth:  1941/06/16      BSA:          1.862 m Patient Age:    83 years       BP:           101/90 mmHg Patient Gender: F              HR:           99 bpm. Exam Location:  Inpatient Procedure: 2D Echo, Cardiac Doppler, Color Doppler and Intracardiac            Opacification Agent (Both Spectral and Color Flow Doppler were            utilized during procedure). Indications:    CHF I50.9  History:        Patient has no prior history of Echocardiogram examinations.                 CHF, Signs/Symptoms:Murmur and Dyspnea; Risk                 Factors:Hypertension.  Sonographer:    BERNARDA ROCKS Referring Phys: [place] STEVEN J NEWTON IMPRESSIONS  1. Hypokinesis of the distal septum and apex with overall low normal LV function.  2. Left ventricular ejection fraction, by estimation, is 50 to 55%. The left ventricle has low normal function. The left ventricle demonstrates regional wall motion abnormalities (see scoring diagram/findings for description). The left ventricular internal cavity size was mildly dilated. There is mild left ventricular  hypertrophy of the basal-septal segment. Left ventricular diastolic parameters are indeterminate.  3. Right ventricular systolic function is normal. The right ventricular size is normal. There is mildly elevated pulmonary artery systolic pressure.  4. Left atrial size was severely dilated.  5. The mitral valve is normal in structure. Mild mitral valve regurgitation. No evidence of mitral stenosis. Moderate mitral annular calcification.  6. The aortic valve is calcified. Aortic valve regurgitation is trivial. Severe aortic valve stenosis.  7. The inferior vena cava is normal in size with <50% respiratory variability, suggesting right atrial pressure of 8 mmHg. Comparison(s): No prior Echocardiogram. FINDINGS  Left Ventricle: Left ventricular ejection fraction, by estimation, is 50 to 55%. The left ventricle has low normal function. The left ventricle demonstrates regional wall motion abnormalities. Definity contrast agent was given IV to delineate the left ventricular endocardial borders. The left ventricular internal cavity size was mildly dilated. There is mild left ventricular hypertrophy of the basal-septal segment. Left ventricular diastolic parameters are indeterminate. Right Ventricle: The right ventricular size is normal. Right ventricular systolic function is normal. There is mildly elevated pulmonary artery systolic pressure. The tricuspid regurgitant velocity is 3.05 m/s, and with an assumed right atrial pressure of 3 mmHg, the estimated right ventricular systolic pressure is 40.2 mmHg. Left Atrium: Left atrial size was severely dilated. Right Atrium: Right atrial size was normal in size. Pericardium: There is no evidence of pericardial effusion. Mitral Valve: The mitral valve is normal in structure. Moderate mitral annular calcification. Mild mitral valve regurgitation. No evidence of mitral valve stenosis. MV peak gradient, 11.4 mmHg. The mean mitral valve gradient is 4.0 mmHg. Tricuspid Valve: The  tricuspid valve is normal in structure. Tricuspid valve regurgitation is trivial. No evidence of tricuspid stenosis. Aortic Valve: The aortic valve is calcified. Aortic valve regurgitation is trivial. Aortic regurgitation PHT measures 243 msec. Severe aortic stenosis is present. Aortic valve mean gradient measures 48.0 mmHg. Aortic valve peak gradient measures 81.0 mmHg.  Aortic valve area, by VTI measures 0.44 cm. Pulmonic Valve: The pulmonic valve was normal in structure. Pulmonic valve regurgitation is not visualized. No evidence of pulmonic stenosis. Aorta: The aortic root is normal in size and structure. Venous: The inferior vena cava is normal in size with less than 50% respiratory variability, suggesting right atrial pressure of 8 mmHg. IAS/Shunts: No atrial level shunt detected by color flow Doppler. Additional Comments: Hypokinesis of the distal septum and apex with overall low normal LV function.  LEFT VENTRICLE PLAX 2D LVIDd:         5.90 cm      Diastology LVIDs:         3.90 cm      LV e' medial:    2.50 cm/s LV PW:         0.80 cm      LV E/e' medial:  46.0 LV IVS:        0.90 cm      LV e' lateral:   6.85 cm/s LVOT diam:     2.00 cm      LV E/e' lateral: 16.8 LV SV:         50 LV SV Index:   27 LVOT Area:     3.14 cm  LV Volumes (MOD) LV vol d, MOD A2C: 179.0 ml LV vol d, MOD A4C: 180.0 ml LV vol s, MOD A2C: 83.1 ml LV vol s, MOD A4C: 81.2 ml LV SV MOD A2C:     95.9 ml LV SV MOD A4C:     180.0 ml LV SV MOD BP:      97.5 ml RIGHT VENTRICLE             IVC RV Basal diam:  3.20 cm     IVC diam: 1.70 cm RV S prime:     17.90 cm/s TAPSE (M-mode): 2.0 cm RVSP:           45.2 mmHg LEFT ATRIUM             Index        RIGHT ATRIUM           Index LA diam:        4.40 cm 2.36 cm/m   RA Pressure: 8.00 mmHg LA Vol (A2C):   98.5 ml 52.90 ml/m  RA Area:     10.40 cm LA Vol (A4C):   85.8 ml 46.08 ml/m  RA Volume:   22.80 ml  12.25 ml/m LA Biplane Vol: 99.2 ml 53.28 ml/m  AORTIC VALVE                      PULMONIC VALVE AV Area (Vmax):    0.51 cm      PV Vmax:          1.02 m/s AV Area (Vmean):   0.42 cm      PV Peak grad:     4.2 mmHg AV Area (VTI):     0.44 cm      PR End Diast Vel: 1.08 msec AV Vmax:           450.00 cm/s AV Vmean:          331.000 cm/s AV VTI:            1.140 m AV Peak Grad:      81.0 mmHg AV Mean Grad:      48.0 mmHg LVOT Vmax:         72.70  cm/s LVOT Vmean:        44.400 cm/s LVOT VTI:          0.158 m LVOT/AV VTI ratio: 0.14 AI PHT:            243 msec  AORTA Ao Root diam: 2.80 cm Ao Asc diam:  3.10 cm MITRAL VALVE                TRICUSPID VALVE MV Area (PHT): 5.42 cm     TR Peak grad:   37.2 mmHg MV Area VTI:   2.48 cm     TR Vmax:        305.00 cm/s MV Peak grad:  11.4 mmHg    Estimated RAP:  8.00 mmHg MV Mean grad:  4.0 mmHg     RVSP:           45.2 mmHg MV Vmax:       1.69 m/s MV Vmean:      90.3 cm/s    SHUNTS MV Decel Time: 140 msec     Systemic VTI:  0.16 m MR Peak grad: 146.4 mmHg    Systemic Diam: 2.00 cm MR Mean grad: 89.0 mmHg MR Vmax:      605.00 cm/s MR Vmean:     438.0 cm/s MV E velocity: 115.00 cm/s MV A velocity: 103.00 cm/s MV E/A ratio:  1.12 Redell Shallow MD Electronically signed by Redell Shallow MD Signature Date/Time: 10/11/2024/4:06:58 PM    Final    DG Chest Port 1 View Result Date: 10/11/2024 EXAM: 1 VIEW(S) XRAY OF THE CHEST 10/11/2024 06:36:14 AM COMPARISON: PA and lateral radiographs of the chest dated 03/10/2023. CLINICAL HISTORY: sob. Sob; rover. FINDINGS: LUNGS AND PLEURA: Diffuse interstitial prominence. Possible trace right pleural effusion. No focal pulmonary opacity. No pulmonary edema. No pneumothorax. HEART AND MEDIASTINUM: Heart size is at the upper limits of normal. Aortic atherosclerosis. No other acute abnormality of the cardiac and mediastinal silhouettes. BONES AND SOFT TISSUES: No acute osseous abnormality. IMPRESSION: 1. Diffuse interstitial prominence/edema and possible trace right pleural effusion. 2. Heart size at upper limits of  normal. Aortic atherosclerosis. Electronically signed by: Evalene Coho MD 10/11/2024 07:00 AM EDT RP Workstation: [id number]    Cardiac Studies Echo:  1. Hypokinesis of the distal septum and apex with overall low normal LV  function.   2. Left ventricular ejection fraction, by estimation, is 50 to 55%. The  left ventricle has low normal function. The left ventricle demonstrates  regional wall motion abnormalities (see scoring diagram/findings for  description). The left ventricular  internal cavity size was mildly dilated. There is mild left ventricular  hypertrophy of the basal-septal segment. Left ventricular diastolic  parameters are indeterminate.   3. Right ventricular systolic function is normal. The right ventricular  size is normal. There is mildly elevated pulmonary artery systolic  pressure.   4. Left atrial size was severely dilated.   5. The mitral valve is normal in structure. Mild mitral valve  regurgitation. No evidence of mitral stenosis. Moderate mitral annular  calcification.   6. The aortic valve is calcified. Aortic valve regurgitation is trivial.  Severe aortic valve stenosis.   7. The inferior vena cava is normal in size with <50% respiratory  variability, suggesting right atrial pressure of 8 mmHg.   Patient Profile   83 y.o. female with history of rheumatoid arthritis, essential thrombocytosis, hypertension who we are consulted for evaluation of suspected heart failure   Assessment & Plan  Aortic stenosis: Presented  with shortness of breath, echo showed severe aortic stenosis.  On review of echo, AS appears critical (Vmax 5.5 m/s, MG , AVA 0.4 cm^2, DI 0.12). Will plan LHC/RHC today and consult structural heart team for TAVR evaluation.  Risks and benefits of cardiac catheterization have been discussed with the patient. These include bleeding, infection, kidney damage, stroke, heart attack, death. The patient understands these risks and is willing to  proceed.   Acute on chronic diastolic heart failure: Presented with volume overload.  Echo shows EF 50 to 55%, normal RV function, severe left atrial enlargement, severe aortic stenosis, RAP 8.  Diuresed with IV Lasix yesterday, currently appears euvolemic.  Will hold on further Lasix for now and follow-up results of RHC.  Hypertension: On olmesartan 20 mg daily and amlodipine  5 mg daily prior to admission.  BP soft, currently holding antihypertensives  For questions or updates, please contact [facility] Please consult [email or web address] for contact info under      Signed, Rhonda LITTIE Nanas, MD  10/12/2024, 9:03 AM

## 2024-10-12 NOTE — H&P (View-Only) (Signed)
 Rounding Note   Patient Name: Rhonda [name] Date of Encounter: 10/12/2024  [facility] Cardiologist: Lonni LITTIE Nanas, MD   Subjective BP 105/60.  Creatinine stable at 1.03.  Net -575 cc yesterday but also with unmeasured UOP.  Reports dyspnea improved.  She denies any chest pain.  Scheduled Meds:  aspirin   81 mg Oral Pre-Cath   enoxaparin (LOVENOX) injection  40 mg Subcutaneous Q24H   insulin aspart  0-9 Units Subcutaneous TID WC   Continuous Infusions:  PRN Meds: ondansetron  **OR** ondansetron  (ZOFRAN ) IV   Vital Signs  Vitals:   10/11/24 2000 10/11/24 2037 10/12/24 0011 10/12/24 0352  BP:  114/65 (!) 110/52 105/60  Pulse: 99 90 93 84  Resp:  20 18 18   Temp:  98.6 F (37 C) 98.1 F (36.7 C) 98.5 F (36.9 C)  TempSrc:  Oral Oral Oral  SpO2: 98% 98% 96% 98%  Weight:   78.7 kg   Height:        Intake/Output Summary (Last 24 hours) at 10/12/2024 0903 Last data filed at 10/12/2024 0442 Gross per 24 hour  Intake 250 ml  Output 825 ml  Net -575 ml      10/12/2024   12:11 AM 10/11/2024    5:07 PM 10/11/2024    6:17 AM  Last 3 Weights  Weight (lbs) 173 lb 9.6 oz 174 lb 4.8 oz 182 lb 15.7 oz  Weight (kg) 78.744 kg 79.062 kg 83 kg      Telemetry NSR WITH PACS, NSVT x 9 beats - Personally Reviewed  ECG  No new ECG - Personally Reviewed  Physical Exam  GEN: No acute distress.   Neck: No JVD Cardiac: Irregular, 3 out of 6 systolic murmur Respiratory: Clear to auscultation bilaterally. GI: Soft, nontender, non-distended  MS: No edema Neuro:  Nonfocal  Psych: Normal affect   Labs High Sensitivity Troponin:   Recent Labs  Lab 10/11/24 0615 10/11/24 0833 10/11/24 0952 10/11/24 1204  TROPONINIHS 9 26* 38* 53*     Chemistry Recent Labs  Lab 10/11/24 0615 10/12/24 0245  NA 131* 133*  K 4.6 4.3  CL 99 98  CO2 20* 25  GLUCOSE 206* 105*  BUN 19 18  CREATININE 1.08* 1.03*  CALCIUM 8.3* 8.4*  MG  --  1.9  PROT 6.4* 5.9*   ALBUMIN  3.4* 3.1*  AST 41 25  ALT 30 23  ALKPHOS 72 58  BILITOT 0.7 0.5  GFRNONAA 51* 54*  ANIONGAP 12 10    Lipids  Recent Labs  Lab 10/12/24 0245  CHOL 113  TRIG 45  HDL 59  LDLCALC 45  CHOLHDL 1.9    Hematology Recent Labs  Lab 10/11/24 0615 10/12/24 0245  WBC 19.1* 13.9*  RBC 3.12* 2.88*  HGB 9.9* 9.1*  HCT 30.8* 27.6*  MCV 98.7 95.8  MCH 31.7 31.6  MCHC 32.1 33.0  RDW 17.2* 17.2*  PLT 389 275   Thyroid  No results for input(s): TSH, FREET4 in the last 168 hours.  BNP Recent Labs  Lab 10/11/24 0615  BNP 201.5*    DDimer  Recent Labs  Lab 10/11/24 0952  DDIMER 1.32*     Radiology  ECHOCARDIOGRAM COMPLETE Result Date: 10/11/2024    ECHOCARDIOGRAM REPORT   Patient Name:   [name] Date of Exam: 10/11/2024 Medical Rec #:  [id number]      Height:       63.0 in Accession #:    [id number]     Weight:  183.0 lb Date of Birth:  1941/06/16      BSA:          1.862 m Patient Age:    83 years       BP:           101/90 mmHg Patient Gender: F              HR:           99 bpm. Exam Location:  Inpatient Procedure: 2D Echo, Cardiac Doppler, Color Doppler and Intracardiac            Opacification Agent (Both Spectral and Color Flow Doppler were            utilized during procedure). Indications:    CHF I50.9  History:        Patient has no prior history of Echocardiogram examinations.                 CHF, Signs/Symptoms:Murmur and Dyspnea; Risk                 Factors:Hypertension.  Sonographer:    BERNARDA ROCKS Referring Phys: [place]ign[place] STEVEN J NEWTON IMPRESSIONS  1. Hypokinesis of the distal septum and apex with overall low normal LV function.  2. Left ventricular ejection fraction, by estimation, is 50 to 55%. The left ventricle has low normal function. The left ventricle demonstrates regional wall motion abnormalities (see scoring diagram/findings for description). The left ventricular internal cavity size was mildly dilated. There is mild left ventricular  hypertrophy of the basal-septal segment. Left ventricular diastolic parameters are indeterminate.  3. Right ventricular systolic function is normal. The right ventricular size is normal. There is mildly elevated pulmonary artery systolic pressure.  4. Left atrial size was severely dilated.  5. The mitral valve is normal in structure. Mild mitral valve regurgitation. No evidence of mitral stenosis. Moderate mitral annular calcification.  6. The aortic valve is calcified. Aortic valve regurgitation is trivial. Severe aortic valve stenosis.  7. The inferior vena cava is normal in size with <50% respiratory variability, suggesting right atrial pressure of 8 mmHg. Comparison(s): No prior Echocardiogram. FINDINGS  Left Ventricle: Left ventricular ejection fraction, by estimation, is 50 to 55%. The left ventricle has low normal function. The left ventricle demonstrates regional wall motion abnormalities. Definity contrast agent was given IV to delineate the left ventricular endocardial borders. The left ventricular internal cavity size was mildly dilated. There is mild left ventricular hypertrophy of the basal-septal segment. Left ventricular diastolic parameters are indeterminate. Right Ventricle: The right ventricular size is normal. Right ventricular systolic function is normal. There is mildly elevated pulmonary artery systolic pressure. The tricuspid regurgitant velocity is 3.05 m/s, and with an assumed right atrial pressure of 3 mmHg, the estimated right ventricular systolic pressure is 40.2 mmHg. Left Atrium: Left atrial size was severely dilated. Right Atrium: Right atrial size was normal in size. Pericardium: There is no evidence of pericardial effusion. Mitral Valve: The mitral valve is normal in structure. Moderate mitral annular calcification. Mild mitral valve regurgitation. No evidence of mitral valve stenosis. MV peak gradient, 11.4 mmHg. The mean mitral valve gradient is 4.0 mmHg. Tricuspid Valve: The  tricuspid valve is normal in structure. Tricuspid valve regurgitation is trivial. No evidence of tricuspid stenosis. Aortic Valve: The aortic valve is calcified. Aortic valve regurgitation is trivial. Aortic regurgitation PHT measures 243 msec. Severe aortic stenosis is present. Aortic valve mean gradient measures 48.0 mmHg. Aortic valve peak gradient measures 81.0 mmHg.  Aortic valve area, by VTI measures 0.44 cm. Pulmonic Valve: The pulmonic valve was normal in structure. Pulmonic valve regurgitation is not visualized. No evidence of pulmonic stenosis. Aorta: The aortic root is normal in size and structure. Venous: The inferior vena cava is normal in size with less than 50% respiratory variability, suggesting right atrial pressure of 8 mmHg. IAS/Shunts: No atrial level shunt detected by color flow Doppler. Additional Comments: Hypokinesis of the distal septum and apex with overall low normal LV function.  LEFT VENTRICLE PLAX 2D LVIDd:         5.90 cm      Diastology LVIDs:         3.90 cm      LV e' medial:    2.50 cm/s LV PW:         0.80 cm      LV E/e' medial:  46.0 LV IVS:        0.90 cm      LV e' lateral:   6.85 cm/s LVOT diam:     2.00 cm      LV E/e' lateral: 16.8 LV SV:         50 LV SV Index:   27 LVOT Area:     3.14 cm  LV Volumes (MOD) LV vol d, MOD A2C: 179.0 ml LV vol d, MOD A4C: 180.0 ml LV vol s, MOD A2C: 83.1 ml LV vol s, MOD A4C: 81.2 ml LV SV MOD A2C:     95.9 ml LV SV MOD A4C:     180.0 ml LV SV MOD BP:      97.5 ml RIGHT VENTRICLE             IVC RV Basal diam:  3.20 cm     IVC diam: 1.70 cm RV S prime:     17.90 cm/s TAPSE (M-mode): 2.0 cm RVSP:           45.2 mmHg LEFT ATRIUM             Index        RIGHT ATRIUM           Index LA diam:        4.40 cm 2.36 cm/m   RA Pressure: 8.00 mmHg LA Vol (A2C):   98.5 ml 52.90 ml/m  RA Area:     10.40 cm LA Vol (A4C):   85.8 ml 46.08 ml/m  RA Volume:   22.80 ml  12.25 ml/m LA Biplane Vol: 99.2 ml 53.28 ml/m  AORTIC VALVE                      PULMONIC VALVE AV Area (Vmax):    0.51 cm      PV Vmax:          1.02 m/s AV Area (Vmean):   0.42 cm      PV Peak grad:     4.2 mmHg AV Area (VTI):     0.44 cm      PR End Diast Vel: 1.08 msec AV Vmax:           450.00 cm/s AV Vmean:          331.000 cm/s AV VTI:            1.140 m AV Peak Grad:      81.0 mmHg AV Mean Grad:      48.0 mmHg LVOT Vmax:         72.70  cm/s LVOT Vmean:        44.400 cm/s LVOT VTI:          0.158 m LVOT/AV VTI ratio: 0.14 AI PHT:            243 msec  AORTA Ao Root diam: 2.80 cm Ao Asc diam:  3.10 cm MITRAL VALVE                TRICUSPID VALVE MV Area (PHT): 5.42 cm     TR Peak grad:   37.2 mmHg MV Area VTI:   2.48 cm     TR Vmax:        305.00 cm/s MV Peak grad:  11.4 mmHg    Estimated RAP:  8.00 mmHg MV Mean grad:  4.0 mmHg     RVSP:           45.2 mmHg MV Vmax:       1.69 m/s MV Vmean:      90.3 cm/s    SHUNTS MV Decel Time: 140 msec     Systemic VTI:  0.16 m MR Peak grad: 146.4 mmHg    Systemic Diam: 2.00 cm MR Mean grad: 89.0 mmHg MR Vmax:      605.00 cm/s MR Vmean:     438.0 cm/s MV E velocity: 115.00 cm/s MV A velocity: 103.00 cm/s MV E/A ratio:  1.12 Redell Shallow MD Electronically signed by Redell Shallow MD Signature Date/Time: 10/11/2024/4:06:58 PM    Final    DG Chest Port 1 View Result Date: 10/11/2024 EXAM: 1 VIEW(S) XRAY OF THE CHEST 10/11/2024 06:36:14 AM COMPARISON: PA and lateral radiographs of the chest dated 03/10/2023. CLINICAL HISTORY: sob. Sob; rover. FINDINGS: LUNGS AND PLEURA: Diffuse interstitial prominence. Possible trace right pleural effusion. No focal pulmonary opacity. No pulmonary edema. No pneumothorax. HEART AND MEDIASTINUM: Heart size is at the upper limits of normal. Aortic atherosclerosis. No other acute abnormality of the cardiac and mediastinal silhouettes. BONES AND SOFT TISSUES: No acute osseous abnormality. IMPRESSION: 1. Diffuse interstitial prominence/edema and possible trace right pleural effusion. 2. Heart size at upper limits of  normal. Aortic atherosclerosis. Electronically signed by: Evalene Coho MD 10/11/2024 07:00 AM EDT RP Workstation: [id number]    Cardiac Studies Echo:  1. Hypokinesis of the distal septum and apex with overall low normal LV  function.   2. Left ventricular ejection fraction, by estimation, is 50 to 55%. The  left ventricle has low normal function. The left ventricle demonstrates  regional wall motion abnormalities (see scoring diagram/findings for  description). The left ventricular  internal cavity size was mildly dilated. There is mild left ventricular  hypertrophy of the basal-septal segment. Left ventricular diastolic  parameters are indeterminate.   3. Right ventricular systolic function is normal. The right ventricular  size is normal. There is mildly elevated pulmonary artery systolic  pressure.   4. Left atrial size was severely dilated.   5. The mitral valve is normal in structure. Mild mitral valve  regurgitation. No evidence of mitral stenosis. Moderate mitral annular  calcification.   6. The aortic valve is calcified. Aortic valve regurgitation is trivial.  Severe aortic valve stenosis.   7. The inferior vena cava is normal in size with <50% respiratory  variability, suggesting right atrial pressure of 8 mmHg.   Patient Profile   83 y.o. female with history of rheumatoid arthritis, essential thrombocytosis, hypertension who we are consulted for evaluation of suspected heart failure   Assessment & Plan  Aortic stenosis: Presented  with shortness of breath, echo showed severe aortic stenosis.  On review of echo, AS appears critical (Vmax 5.5 m/s, MG , AVA 0.4 cm^2, DI 0.12). Will plan LHC/RHC today and consult structural heart team for TAVR evaluation.  Risks and benefits of cardiac catheterization have been discussed with the patient. These include bleeding, infection, kidney damage, stroke, heart attack, death. The patient understands these risks and is willing to  proceed.   Acute on chronic diastolic heart failure: Presented with volume overload.  Echo shows EF 50 to 55%, normal RV function, severe left atrial enlargement, severe aortic stenosis, RAP 8.  Diuresed with IV Lasix yesterday, currently appears euvolemic.  Will hold on further Lasix for now and follow-up results of RHC.  Hypertension: On olmesartan 20 mg daily and amlodipine  5 mg daily prior to admission.  BP soft, currently holding antihypertensives  For questions or updates, please contact [facility] Please consult [email or web address] for contact info under      Signed, Lonni LITTIE Nanas, MD  10/12/2024, 9:03 AM

## 2024-10-12 NOTE — Assessment & Plan Note (Addendum)
 Echocardiogram with preserved LV systolic function with EF 50 to 55%, mild dilatated LV cavity, mild LVH, mild left LVH, basal septal segment. RV systolic function preserved, mild mitral valve regurgitation, severe aortic valve stenosis.  Hypokinesis of the distal septum and apex.   10/16 cardiac catheterization  Mild non obstructive coronary artery disease RA 12/9 RV 37/8 PA mean 28  PCWP 20  Cardiac output 4.9 and index 2.7 (Fick)   Urine output is 1600 ml  Systolic blood pressure 120 mmHg   Possible resumption ABR at the time of discharge.  Continue diuresis with furosemide 20 mg po daily.   Acute hypoxemic respiratory failure due to acute cardiogenic pulmonary edema Clinically resolved

## 2024-10-12 NOTE — Assessment & Plan Note (Signed)
Calculated BMI is 31.7

## 2024-10-12 NOTE — Interval H&P Note (Signed)
 History and Physical Interval Note:  10/12/2024 2:50 PM  Rhonda Rhonda  has presented today for surgery, with the diagnosis of chf/as.  The various methods of treatment have been discussed with the patient and family. After consideration of risks, benefits and other options for treatment, the patient has consented to  Procedure(s): RIGHT/LEFT HEART CATH AND CORONARY ANGIOGRAPHY (N/A) as a surgical intervention.  The patient's history has been reviewed, patient examined, no change in status, stable for surgery.  I have reviewed the patient's chart and labs.  Questions were answered to the patient's satisfaction.    Cath Lab Visit (complete for each Cath Lab visit)  Clinical Evaluation Leading to the Procedure:   ACS: No.  Non-ACS:    Anginal Classification: CCS II  Anti-ischemic medical therapy: Minimal Therapy (1 class of medications)  Non-Invasive Test Results: No non-invasive testing performed  Prior CABG: No previous CABG        Rhonda [name]

## 2024-10-12 NOTE — Hospital Course (Addendum)
 Rhonda [name] was admitted to the hospital with the working diagnosis of heart failure exacerbation.   83 yo female with the past medical history of rheumatoid arthritis, hypertension, thrombocytosis, who presented with dyspnea. Reported worsening symptoms for the last 2 to 3 days prior to admission, associated with orthopnea and lower extremity edema. The night prior to admission had a severe episode of PND, EMS was called and she was found in respiratory distress with a 02 saturation of 70% on room air. She was placed on Cpap and transported to the ED.  On her initial physical examination her blood pressure was 118/67, HR 95, RR 2 and 02 saturation 100% on supplemental 02 per [place]  Lungs with no wheezing or rhonchi, heart with S1 and S2 present and regular with no gallops or murmurs, abdomen with no distention and positive lower extremity edema.   Na 131, K 4.6 Cl 99 bicarbonate 20, glucose 206 bun 19 cr 1,0  AST 41 ALT 30  BNP 201 High sensitive troponin 9 and 26  Wbc 19,1 hgb 9.9 plt 389   Chest radiograph with cardiomegaly, bilateral hilar vascular congestion, bilateral central interstitial infiltrates.   EKG 102 bpm, normal axis, normal intervals, qtc 473, sinus rhythm with 1st degree AV block, no significant ST segment or  T wave changes, positive PVC and PAC   Patient was placed on furosemide for diuresis Echocardiogram with preserved LV systolic function and severe aortic stenosis Cardiac catheterization with no significant CAD, positive elevated filling pressures  10/18 patient clinically stable, will follow up as outpatient for possible TAVR.

## 2024-10-12 NOTE — Plan of Care (Signed)

## 2024-10-12 NOTE — Plan of Care (Signed)
 Problem: Education: Goal: Ability to describe self-care measures that may prevent or decrease complications (Diabetes Survival Skills Education) will improve 10/12/2024 0701 by Jennine Benton HERO, RN Outcome: Progressing 10/12/2024 0700 by Jennine Benton HERO, RN Outcome: Progressing Goal: Individualized Educational Video(s) 10/12/2024 0701 by Jennine Benton HERO, RN Outcome: Progressing 10/12/2024 0700 by Jennine Benton HERO, RN Outcome: Progressing   Problem: Coping: Goal: Ability to adjust to condition or change in health will improve 10/12/2024 0701 by Jennine Benton HERO, RN Outcome: Progressing 10/12/2024 0700 by Jennine Benton HERO, RN Outcome: Progressing   Problem: Fluid Volume: Goal: Ability to maintain a balanced intake and output will improve 10/12/2024 0701 by Jennine Benton HERO, RN Outcome: Progressing 10/12/2024 0700 by Jennine Benton HERO, RN Outcome: Progressing   Problem: Health Behavior/Discharge Planning: Goal: Ability to identify and utilize available resources and services will improve 10/12/2024 0701 by Jennine Benton HERO, RN Outcome: Progressing 10/12/2024 0700 by Jennine Benton HERO, RN Outcome: Progressing Goal: Ability to manage health-related needs will improve 10/12/2024 0701 by Jennine Benton HERO, RN Outcome: Progressing 10/12/2024 0700 by Jennine Benton HERO, RN Outcome: Progressing   Problem: Metabolic: Goal: Ability to maintain appropriate glucose levels will improve 10/12/2024 0701 by Jennine Benton HERO, RN Outcome: Progressing 10/12/2024 0700 by Jennine Benton HERO, RN Outcome: Progressing   Problem: Nutritional: Goal: Maintenance of adequate nutrition will improve 10/12/2024 0701 by Jennine Benton HERO, RN Outcome: Progressing 10/12/2024 0700 by Jennine Benton HERO, RN Outcome: Progressing Goal: Progress toward achieving an optimal weight will improve 10/12/2024 0701 by Jennine Benton HERO, RN Outcome: Progressing 10/12/2024 0700 by  Jennine Benton HERO, RN Outcome: Progressing   Problem: Skin Integrity: Goal: Risk for impaired skin integrity will decrease 10/12/2024 0701 by Jennine Benton HERO, RN Outcome: Progressing 10/12/2024 0700 by Jennine Benton HERO, RN Outcome: Progressing   Problem: Tissue Perfusion: Goal: Adequacy of tissue perfusion will improve 10/12/2024 0701 by Jennine Benton HERO, RN Outcome: Progressing 10/12/2024 0700 by Jennine Benton HERO, RN Outcome: Progressing   Problem: Education: Goal: Knowledge of General Education information will improve Description: Including pain rating scale, medication(s)/side effects and non-pharmacologic comfort measures 10/12/2024 0701 by Jennine Benton HERO, RN Outcome: Progressing 10/12/2024 0700 by Jennine Benton HERO, RN Outcome: Progressing   Problem: Health Behavior/Discharge Planning: Goal: Ability to manage health-related needs will improve 10/12/2024 0701 by Jennine Benton HERO, RN Outcome: Progressing 10/12/2024 0700 by Jennine Benton HERO, RN Outcome: Progressing   Problem: Clinical Measurements: Goal: Ability to maintain clinical measurements within normal limits will improve 10/12/2024 0701 by Jennine Benton HERO, RN Outcome: Progressing 10/12/2024 0700 by Jennine Benton HERO, RN Outcome: Progressing Goal: Will remain free from infection 10/12/2024 0701 by Jennine Benton HERO, RN Outcome: Progressing 10/12/2024 0700 by Jennine Benton HERO, RN Outcome: Progressing Goal: Diagnostic test results will improve 10/12/2024 0701 by Jennine Benton HERO, RN Outcome: Progressing 10/12/2024 0700 by Jennine Benton HERO, RN Outcome: Progressing Goal: Respiratory complications will improve 10/12/2024 0701 by Jennine Benton HERO, RN Outcome: Progressing 10/12/2024 0700 by Jennine Benton HERO, RN Outcome: Progressing Goal: Cardiovascular complication will be avoided 10/12/2024 0701 by Jennine Benton HERO, RN Outcome: Progressing 10/12/2024 0700 by Jennine Benton HERO, RN Outcome: Progressing   Problem: Activity: Goal: Risk for activity intolerance will decrease 10/12/2024 0701 by Jennine Benton HERO, RN Outcome: Progressing 10/12/2024 0700 by Jennine Benton HERO, RN Outcome: Progressing   Problem: Nutrition: Goal: Adequate nutrition will be maintained 10/12/2024 0701 by Jennine Benton HERO, RN Outcome: Progressing 10/12/2024 0700 by Jennine Benton HERO, RN Outcome: Progressing  Problem: Coping: Goal: Level of anxiety will decrease 10/12/2024 0701 by Jennine Benton HERO, RN Outcome: Progressing 10/12/2024 0700 by Jennine Benton HERO, RN Outcome: Progressing   Problem: Elimination: Goal: Will not experience complications related to bowel motility 10/12/2024 0701 by Jennine Benton HERO, RN Outcome: Progressing 10/12/2024 0700 by Jennine Benton HERO, RN Outcome: Progressing Goal: Will not experience complications related to urinary retention 10/12/2024 0701 by Jennine Benton HERO, RN Outcome: Progressing 10/12/2024 0700 by Jennine Benton HERO, RN Outcome: Progressing   Problem: Pain Managment: Goal: General experience of comfort will improve and/or be controlled 10/12/2024 0701 by Jennine Benton HERO, RN Outcome: Progressing 10/12/2024 0700 by Jennine Benton HERO, RN Outcome: Progressing   Problem: Safety: Goal: Ability to remain free from injury will improve 10/12/2024 0701 by Jennine Benton HERO, RN Outcome: Progressing 10/12/2024 0700 by Jennine Benton HERO, RN Outcome: Progressing   Problem: Skin Integrity: Goal: Risk for impaired skin integrity will decrease 10/12/2024 0701 by Jennine Benton HERO, RN Outcome: Progressing 10/12/2024 0700 by Jennine Benton HERO, RN Outcome: Progressing   Problem: Education: Goal: Ability to demonstrate management of disease process will improve 10/12/2024 0701 by Jennine Benton HERO, RN Outcome: Progressing 10/12/2024 0700 by Jennine Benton HERO, RN Outcome: Progressing Goal: Ability to  verbalize understanding of medication therapies will improve 10/12/2024 0701 by Jennine Benton HERO, RN Outcome: Progressing 10/12/2024 0700 by Jennine Benton HERO, RN Outcome: Progressing Goal: Individualized Educational Video(s) 10/12/2024 0701 by Jennine Benton HERO, RN Outcome: Progressing 10/12/2024 0700 by Jennine Benton HERO, RN Outcome: Progressing   Problem: Activity: Goal: Capacity to carry out activities will improve 10/12/2024 0701 by Jennine Benton HERO, RN Outcome: Progressing 10/12/2024 0700 by Jennine Benton HERO, RN Outcome: Progressing   Problem: Cardiac: Goal: Ability to achieve and maintain adequate cardiopulmonary perfusion will improve 10/12/2024 0701 by Jennine Benton HERO, RN Outcome: Progressing 10/12/2024 0700 by Jennine Benton HERO, RN Outcome: Progressing   Problem: Education: Goal: Understanding of CV disease, CV risk reduction, and recovery process will improve 10/12/2024 0701 by Jennine Benton HERO, RN Outcome: Progressing 10/12/2024 0700 by Jennine Benton HERO, RN Outcome: Progressing Goal: Individualized Educational Video(s) 10/12/2024 0701 by Jennine Benton HERO, RN Outcome: Progressing 10/12/2024 0700 by Jennine Benton HERO, RN Outcome: Progressing   Problem: Activity: Goal: Ability to return to baseline activity level will improve 10/12/2024 0701 by Jennine Benton HERO, RN Outcome: Progressing 10/12/2024 0700 by Jennine Benton HERO, RN Outcome: Progressing   Problem: Cardiovascular: Goal: Ability to achieve and maintain adequate cardiovascular perfusion will improve 10/12/2024 0701 by Jennine Benton HERO, RN Outcome: Progressing 10/12/2024 0700 by Jennine Benton HERO, RN Outcome: Progressing Goal: Vascular access site(s) Level 0-1 will be maintained 10/12/2024 0701 by Jennine Benton HERO, RN Outcome: Progressing 10/12/2024 0700 by Jennine Benton HERO, RN Outcome: Progressing   Problem: Health Behavior/Discharge Planning: Goal: Ability to  safely manage health-related needs after discharge will improve 10/12/2024 0701 by Jennine Benton HERO, RN Outcome: Progressing 10/12/2024 0700 by Jennine Benton HERO, RN Outcome: Progressing

## 2024-10-12 NOTE — Consult Note (Addendum)
 [facility]   MULTIDISCIPLINARY HEART VALVE TEAM  Cardiology Consultation:   Patient ID: Rhonda [name] MRN: [id number]; DOB: Sep 12, 1941  Admit date: 10/11/2024 Date of Consult: 10/12/2024  Primary Care Provider: Elliot Charm, MD [facility] Cardiologist: Lonni LITTIE Nanas, MD  [facility] Electrophysiologist:  None    Patient Profile:   Rhonda [name] is a 83 y.o. female with a hx of rheumatoid arthritis on methotrexate  and Plaquenil , GERD, anemia, CKD stage IIIa, hyponatremia, HTN, HLD, obesity, and essential thrombocytosis who is being seen today for the evaluation of critical AS and acute CHF at the request of Dr. Nanas.  History of Present Illness:   Rhonda [name] lives in [place] with her husband who is an Art gallery manager from [place]. She is originally from [place].  She and her husband moved here many years ago and live near [place] in [place], [place].  She has 2 daughters and several grandchildren.  She is very active cooking and cleaning around the house.  She has able to do all of her own ADLs and takes care of her 54 year old husband.  Of note, she has regular dental checkups with no issues.  She was seen by Dr. Shellia with pulmonology for a recurrent cough in the setting of rheumatoid arthritis. CT chest showed changes of centrilobular ground glass nodules in upper lobes bilaterally. Bronchoscopy in 2020 was negative and changes felt to be related to her rheumatoid arthritis. Follow up CT chest w/o contrast in April 2021 showed multivessel coronary calcifications, hepatic steatosis with suspicion of cirrhosis, and aortic valve calcification. Echocardiography to evaluate for valvular dysfunction was recommended, but never completed.  She reports knowing she had a heart murmur and having a very remote echo but never any follow-up after that.  She has never seen cardiology.  She was admitted in 08/2020 after mechanical fall and was requiring hip  arthroplasty.  She also sustained an injury to her left calf.  Since that time she has had more difficult mobility, but still remains very active.   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  She was recently started on an anagrelide  within the past 6 months.  She was in her usual state of health until 10/11/2024 when she presented to the [facility] ER with acute shortness of breath x2-3 days. She was found to be hypoxic with O2 sats in the 70s on room air requiring BIPAP.  Workup in the ER showed leukocytosis with white count 19.1, hemoglobin 9.9, platelets 389., troponin in the 20s, creatinine 1.08, BNP 202, Chest x-ray with diffuse interstitial edema and trace right pleural effusion.  She was started on IV Lasix and cardiology consulted. Anagrelide  discontinued given potential for precipitating heart failure.  Echo 10/11/24 showed EF 50-55%, with hypokinesis of the distal septum and apex, mild LV dilation, mildly elevated pulm pressure, moderate MAC with mild MR and severe aortic stenosis with a mean gradient of 44.4 mm hg, Vmax 4.5 m/s, AVA 0.42, DVI 0.14, SVI 27.   Structural heart is consulted for consideration of TAVR.  She is seen sitting up in the chair alone.  She reports significant improvement in her breathing after diuresis.  She reports being very tired over the last year or so.  She notices it most when she is trying to shop at [employer].  She has had mild lower extremity edema as well as abdominal distention.  More recently she has had significant orthopnea and PND.  She denies chest pain she  does have some mild exertional dizziness but no syncope. She feels much better after IV lasix.   Past Medical History:  Diagnosis Date   Anxiety    pt. may have panicy feeling upon awaking from anesth. - states she feels anxious at times  & has used Paxil  & xanax in the [past but its been a very long time ago    Arthritis    rheumatoid & OA-hands, shoulders     Dyspnea    GERD (gastroesophageal reflux disease)    Heart murmur    Hypertension     Past Surgical History:  Procedure Laterality Date   APPENDECTOMY     laproscopic   BREAST BIOPSY Left 09/10/2023   MM LT BREAST BX W LOC DEV 1ST LESION IMAGE BX SPEC STEREO GUIDE 09/10/2023 [facility]   BREAST BIOPSY Left 09/10/2023   MM LT BREAST BX W LOC DEV EA AD LESION IMG BX SPEC STEREO GUIDE 09/10/2023 [facility]   BREAST EXCISIONAL BIOPSY Left    COLONOSCOPY     HEMORROIDECTOMY     INSERTION OF MESH N/A 09/12/2018   Procedure: INSERTION OF MESH;  Surgeon: Curvin Deward MOULD, MD;  Location: [facility] OR;  Service: General;  Laterality: N/A;   LAPAROSCOPIC APPENDECTOMY N/A 01/06/2017   Procedure: LAPAROSCOPIC APPENDECTOMY;  Surgeon: Deward Curvin III, MD;  Location: [facility] OR;  Service: General;  Laterality: N/A;   TONSILLECTOMY     as a child   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   Procedure: HIP ARTHROPLASTY ANTERIOR APPROACH;  Surgeon: Addie Cordella Hamilton, MD;  Location: [facility] OR;  Service: Orthopedics;  Laterality: Left;   VAGINAL DELIVERY     x2   VENTRAL HERNIA REPAIR N/A 09/12/2018   Procedure: LAPAROSCOPIC VENTRAL HERNIA REPAIR WITH MESH;  Surgeon: Curvin Deward MOULD, MD;  Location[facility] OR;  Service: General;  Laterality: N/A;   VIDEO BRONCHOSCOPY Bilateral 11/07/2019   Procedure: VIDEO BRONCHOSCOPY WITH FLUORO;  Surgeon: Shellia Oh, MD;  Location: [facility] ENDOSCOPY;  Service: Endoscopy;  Laterality: Bilateral;     Home Medications:  Prior to Admission medications   Medication Sig Start Date End Date Taking? Authorizing Provider  acetaminophen  (TYLENOL ) 650 MG CR tablet Take 1,300 mg by mouth every 8 (eight) hours as needed for pain.   Yes [provider]  alendronate (FOSAMAX) 70 MG tablet Take 70 mg by mouth once a week. 10/06/24  Yes [provider]  amLODipine  (NORVASC ) 5 MG tablet Take 5 mg by mouth daily. Patient taking differently: Take 5 mg by mouth 2 (two) times  daily. 10/20/19  Yes [provider]  anagrelide  (AGRYLIN) 0.5 MG capsule Take 1 capsule (0.5 mg total) by mouth 2 (two) times daily. 09/26/24  Yes Thayil, Irene T, PA-C  Carboxymethylcellulose Sodium (REFRESH LIQUIGEL) 1 % GEL Place 1 drop into both eyes at bedtime.   Yes [provider]  folic acid  (FOLVITE ) 800 MCG tablet Take 800 mcg by mouth daily.    Yes [provider]  hydroxychloroquine  (PLAQUENIL ) 200 MG tablet TAKE 1 TABLET BY MOUTH TWICE  DAILY 05/23/24  Yes Federico Norleen ONEIDA MADISON, MD  levothyroxine  (SYNTHROID , LEVOTHROID) 25 MCG tablet Take 25 mcg by mouth daily before breakfast.  09/16/16  Yes [provider]  methotrexate  (RHEUMATREX) 2.5 MG tablet Take 12.5 mg by mouth every Friday.  10/04/16  Yes [provider]  Multiple Minerals-Vitamins (CALCIUM-MAGNESIUM-ZINC-D3 PO) Take 1 tablet by mouth 2 (two) times daily.    Yes [provider]  Multiple Vitamin (MULTIVITAMIN WITH MINERALS) TABS tablet Take 1 tablet by mouth daily.   Yes [provider]  olmesartan (BENICAR) 20 MG tablet Take 20 mg by mouth daily.    Yes [provider]  omeprazole (PRILOSEC) 40 MG capsule Take 40 mg by mouth daily. 09/16/16  Yes [provider]  PARoxetine  (PAXIL ) 20 MG tablet Take 20 mg by mouth daily. 08/30/20  Yes [provider]  polyethylene glycol (MIRALAX / GLYCOLAX) packet Take 17 g by mouth daily.    Yes [provider]  Polyvinyl Alcohol -Povidone PF (REFRESH) 1.4-0.6 % SOLN Place 1 drop into both eyes daily.   Yes [provider]  Probiotic Product (PROBIOTIC DAILY PO) Take 1 capsule by mouth daily.   Yes [provider]  simvastatin  (ZOCOR ) 40 MG tablet Take 40 mg by mouth at bedtime.  09/16/16  Yes [provider]    Inpatient Medications: Scheduled Meds:  aspirin   81 mg Oral Pre-Cath   enoxaparin (LOVENOX) injection  40 mg Subcutaneous Q24H   insulin aspart  0-9 Units  Subcutaneous TID WC   Continuous Infusions:  PRN Meds: ondansetron  **OR** ondansetron  (ZOFRAN ) IV  Allergies:    Allergies  Allergen Reactions   Amlodipine  Other (See Comments)   Atorvastatin Other (See Comments)   Lisinopril Other (See Comments)   Meloxicam Other (See Comments)    Other Reaction(s): stomach upset, hives   Aleve [Naproxen] Other (See Comments)    GI upset    Social History:   Social History   Socioeconomic History   Marital status: Married    Spouse name: Not on file   Number of children: Not on file   Years of education: Not on file   Highest education level: Not on file  Occupational History   Not on file  Tobacco Use   Smoking status: Never   Smokeless tobacco: Never  Substance and Sexual Activity   Alcohol  use: Yes    Comment: daily- wine & bourbon & ginger    Drug use: No   Sexual activity: Not on file  Other Topics Concern   Not on file  Social History Narrative   Not on file   Social Drivers of Health   Financial Resource Strain: Not on file  Food Insecurity: No Food Insecurity (10/11/2024)   Hunger Vital Sign    Worried About Running Out of Food in the Last Year: Never true    Ran Out of Food in the Last Year: Never true  Transportation Needs: No Transportation Needs (10/11/2024)   PRAPARE - Administrator, Civil Service (Medical): No    Lack of Transportation (Non-Medical): No  Physical Activity: Not on file  Stress: Not on file  Social Connections: Moderately Integrated (10/11/2024)   Social Connection and Isolation Panel    Frequency of Communication with Friends and Family: More than three times a week    Frequency of Social Gatherings with Friends and Family: More than three times a week    Attends Religious Services: 1 to 4 times per year    Active Member of [employer] or Organizations: No    Attends Banker Meetings: Never    Marital Status: Married  Catering manager Violence: Not At Risk (10/11/2024)    Humiliation, Afraid, Rape, and Kick questionnaire    Fear of Current or Ex-Partner: No    Emotionally Abused: No    Physically Abused: No    Sexually Abused: No    Family History:  Family History  Problem Relation Age of Onset   Breast cancer Daughter    Breast cancer Maternal Grandfather      ROS:  Please see the history of present illness.   All other ROS reviewed and negative.     Physical Exam/Data:   Vitals:   10/11/24 2000 10/11/24 2037 10/12/24 0011 10/12/24 0352  BP:  114/65 (!) 110/52 105/60  Pulse: 99 90 93 84  Resp:  20 18 18   Temp:  98.6 F (37 C) 98.1 F (36.7 C) 98.5 F (36.9 C)  TempSrc:  Oral Oral Oral  SpO2: 98% 98% 96% 98%  Weight:   78.7 kg   Height:        Intake/Output Summary (Last 24 hours) at 10/12/2024 1051 Last data filed at 10/12/2024 0442 Gross per 24 hour  Intake 250 ml  Output 825 ml  Net -575 ml      10/12/2024   12:11 AM 10/11/2024    5:07 PM 10/11/2024    6:17 AM  Last 3 Weights  Weight (lbs) 173 lb 9.6 oz 174 lb 4.8 oz 182 lb 15.7 oz  Weight (kg) 78.744 kg 79.062 kg 83 kg     Body mass index is 31.75 kg/m.  General:  Well nourished, well developed, in no acute distress HEENT: normal Neck: no JVD Cardiac:  normal S1, S2; RR, tachy, 4/6 harsh systolic ejection murmur heard best at the right upper sternal border. Lungs:  clear to auscultation bilaterally, no wheezing, rhonchi or rales  Abd: soft, nontender, no hepatomegaly  Ext: no edema Musculoskeletal:  No deformities, BUE and BLE strength normal and equal Skin: warm and dry  Neuro:  CNs 2-12 intact, no focal abnormalities noted Psych:  Normal affect   EKG:  The EKG was personally reviewed and demonstrates: Sinus tachycardia.  PVCs, heart rate 102 Telemetry:  Telemetry was personally reviewed and demonstrates: Sinus tachycardia with heart rates in the low 100s.  10 beat  and 4 bear run of NSVT.  Cardiac Studies & Procedures    ______________________________________________________________________________________________     ECHOCARDIOGRAM  ECHOCARDIOGRAM COMPLETE 10/11/2024  Narrative ECHOCARDIOGRAM REPORT    Patient Name:   Marquerite JONELLE [name] Date of Exam: 10/11/2024 Medical Rec #:  [id number]      Height:       63.0 in Accession #:    [id number]     Weight:       183.0 lb Date of Birth:  03-11-1941      BSA:          1.862 m Patient Age:    83 years       BP:           101/90 mmHg Patient Gender: F              HR:           99 bpm. Exam Location:  Inpatient  Procedure: 2D Echo, Cardiac Doppler, Color Doppler and Intracardiac Opacification Agent (Both Spectral and Color Flow Doppler were utilized during procedure).  Indications:    CHF I50.9  History:        Patient has no prior history of Echocardiogram examinations. CHF, Signs/Symptoms:Murmur and Dyspnea; Risk Factors:Hypertension.  Sonographer:    BERNARDA ROCKS Referring Phys: [place].Bigness STEVEN J NEWTON  IMPRESSIONS   1. Hypokinesis of the distal septum and apex with overall low normal LV function. 2. Left ventricular ejection fraction, by estimation, is 50 to 55%. The left ventricle has low normal function.  The left ventricle demonstrates regional wall motion abnormalities (see scoring diagram/findings for description). The left ventricular internal cavity size was mildly dilated. There is mild left ventricular hypertrophy of the basal-septal segment. Left ventricular diastolic parameters are indeterminate. 3. Right ventricular systolic function is normal. The right ventricular size is normal. There is mildly elevated pulmonary artery systolic pressure. 4. Left atrial size was severely dilated. 5. The mitral valve is normal in structure. Mild mitral valve regurgitation. No evidence of mitral stenosis. Moderate mitral annular calcification. 6. The aortic valve is calcified. Aortic valve regurgitation is trivial. Severe aortic valve stenosis. 7. The  inferior vena cava is normal in size with <50% respiratory variability, suggesting right atrial pressure of 8 mmHg.  Comparison(s): No prior Echocardiogram.  FINDINGS Left Ventricle: Left ventricular ejection fraction, by estimation, is 50 to 55%. The left ventricle has low normal function. The left ventricle demonstrates regional wall motion abnormalities. Definity contrast agent was given IV to delineate the left ventricular endocardial borders. The left ventricular internal cavity size was mildly dilated. There is mild left ventricular hypertrophy of the basal-septal segment. Left ventricular diastolic parameters are indeterminate.  Right Ventricle: The right ventricular size is normal. Right ventricular systolic function is normal. There is mildly elevated pulmonary artery systolic pressure. The tricuspid regurgitant velocity is 3.05 m/s, and with an assumed right atrial pressure of 3 mmHg, the estimated right ventricular systolic pressure is 40.2 mmHg.  Left Atrium: Left atrial size was severely dilated.  Right Atrium: Right atrial size was normal in size.  Pericardium: There is no evidence of pericardial effusion.  Mitral Valve: The mitral valve is normal in structure. Moderate mitral annular calcification. Mild mitral valve regurgitation. No evidence of mitral valve stenosis. MV peak gradient, 11.4 mmHg. The mean mitral valve gradient is 4.0 mmHg.  Tricuspid Valve: The tricuspid valve is normal in structure. Tricuspid valve regurgitation is trivial. No evidence of tricuspid stenosis.  Aortic Valve: The aortic valve is calcified. Aortic valve regurgitation is trivial. Aortic regurgitation PHT measures 243 msec. Severe aortic stenosis is present. Aortic valve mean gradient measures 48.0 mmHg. Aortic valve peak gradient measures 81.0 mmHg. Aortic valve area, by VTI measures 0.44 cm.  Pulmonic Valve: The pulmonic valve was normal in structure. Pulmonic valve regurgitation is not  visualized. No evidence of pulmonic stenosis.  Aorta: The aortic root is normal in size and structure.  Venous: The inferior vena cava is normal in size with less than 50% respiratory variability, suggesting right atrial pressure of 8 mmHg.  IAS/Shunts: No atrial level shunt detected by color flow Doppler.  Additional Comments: Hypokinesis of the distal septum and apex with overall low normal LV function.   LEFT VENTRICLE PLAX 2D LVIDd:         5.90 cm      Diastology LVIDs:         3.90 cm      LV e' medial:    2.50 cm/s LV PW:         0.80 cm      LV E/e' medial:  46.0 LV IVS:        0.90 cm      LV e' lateral:   6.85 cm/s LVOT diam:     2.00 cm      LV E/e' lateral: 16.8 LV SV:         50 LV SV Index:   27 LVOT Area:     3.14 cm  LV Volumes (MOD) LV vol d, MOD  A2C: 179.0 ml LV vol d, MOD A4C: 180.0 ml LV vol s, MOD A2C: 83.1 ml LV vol s, MOD A4C: 81.2 ml LV SV MOD A2C:     95.9 ml LV SV MOD A4C:     180.0 ml LV SV MOD BP:      97.5 ml  RIGHT VENTRICLE             IVC RV Basal diam:  3.20 cm     IVC diam: 1.70 cm RV S prime:     17.90 cm/s TAPSE (M-mode): 2.0 cm RVSP:           45.2 mmHg  LEFT ATRIUM             Index        RIGHT ATRIUM           Index LA diam:        4.40 cm 2.36 cm/m   RA Pressure: 8.00 mmHg LA Vol (A2C):   98.5 ml 52.90 ml/m  RA Area:     10.40 cm LA Vol (A4C):   85.8 ml 46.08 ml/m  RA Volume:   22.80 ml  12.25 ml/m LA Biplane Vol: 99.2 ml 53.28 ml/m AORTIC VALVE                     PULMONIC VALVE AV Area (Vmax):    0.51 cm      PV Vmax:          1.02 m/s AV Area (Vmean):   0.42 cm      PV Peak grad:     4.2 mmHg AV Area (VTI):     0.44 cm      PR End Diast Vel: 1.08 msec AV Vmax:           450.00 cm/s AV Vmean:          331.000 cm/s AV VTI:            1.140 m AV Peak Grad:      81.0 mmHg AV Mean Grad:      48.0 mmHg LVOT Vmax:         72.70 cm/s LVOT Vmean:        44.400 cm/s LVOT VTI:          0.158 m LVOT/AV VTI ratio: 0.14 AI  PHT:            243 msec  AORTA Ao Root diam: 2.80 cm Ao Asc diam:  3.10 cm  MITRAL VALVE                TRICUSPID VALVE MV Area (PHT): 5.42 cm     TR Peak grad:   37.2 mmHg MV Area VTI:   2.48 cm     TR Vmax:        305.00 cm/s MV Peak grad:  11.4 mmHg    Estimated RAP:  8.00 mmHg MV Mean grad:  4.0 mmHg     RVSP:           45.2 mmHg MV Vmax:       1.69 m/s MV Vmean:      90.3 cm/s    SHUNTS MV Decel Time: 140 msec     Systemic VTI:  0.16 m MR Peak grad: 146.4 mmHg    Systemic Diam: 2.00 cm MR Mean grad: 89.0 mmHg MR Vmax:      605.00 cm/s MR Vmean:     438.0 cm/s MV E  velocity: 115.00 cm/s MV A velocity: 103.00 cm/s MV E/A ratio:  1.12  Redell Shallow MD Electronically signed by Redell Shallow MD Signature Date/Time: 10/11/2024/4:06:58 PM    Final          ______________________________________________________________________________________________      Laboratory Data:  High Sensitivity Troponin:   Recent Labs  Lab 10/11/24 0615 10/11/24 0833 10/11/24 0952 10/11/24 1204  TROPONINIHS 9 26* 38* 53*     Chemistry Recent Labs  Lab 10/11/24 0615 10/12/24 0245  NA 131* 133*  K 4.6 4.3  CL 99 98  CO2 20* 25  GLUCOSE 206* 105*  BUN 19 18  CREATININE 1.08* 1.03*  CALCIUM 8.3* 8.4*  GFRNONAA 51* 54*  ANIONGAP 12 10    Recent Labs  Lab 10/11/24 0615 10/12/24 0245  PROT 6.4* 5.9*  ALBUMIN  3.4* 3.1*  AST 41 25  ALT 30 23  ALKPHOS 72 58  BILITOT 0.7 0.5   Hematology Recent Labs  Lab 10/11/24 0615 10/12/24 0245  WBC 19.1* 13.9*  RBC 3.12* 2.88*  HGB 9.9* 9.1*  HCT 30.8* 27.6*  MCV 98.7 95.8  MCH 31.7 31.6  MCHC 32.1 33.0  RDW 17.2* 17.2*  PLT 389 275   BNP Recent Labs  Lab 10/11/24 0615  BNP 201.5*    DDimer  Recent Labs  Lab 10/11/24 0952  DDIMER 1.32*     Radiology/Studies:  ECHOCARDIOGRAM COMPLETE Result Date: 10/11/2024    ECHOCARDIOGRAM REPORT   Patient Name:   Rhonda [name] Date of Exam: 10/11/2024 Medical Rec  #:  [id number]      Height:       63.0 in Accession #:    [id number]     Weight:       183.0 lb Date of Birth:  October 12, 1941      BSA:          1.862 m Patient Age:    83 years       BP:           101/90 mmHg Patient Gender: F              HR:           99 bpm. Exam Location:  Inpatient Procedure: 2D Echo, Cardiac Doppler, Color Doppler and Intracardiac            Opacification Agent (Both Spectral and Color Flow Doppler were            utilized during procedure). Indications:    CHF I50.9  History:        Patient has no prior history of Echocardiogram examinations.                 CHF, Signs/Symptoms:Murmur and Dyspnea; Risk                 Factors:Hypertension.  Sonographer:    BERNARDA ROCKS Referring Phys: [place].Bigness STEVEN J NEWTON IMPRESSIONS  1. Hypokinesis of the distal septum and apex with overall low normal LV function.  2. Left ventricular ejection fraction, by estimation, is 50 to 55%. The left ventricle has low normal function. The left ventricle demonstrates regional wall motion abnormalities (see scoring diagram/findings for description). The left ventricular internal cavity size was mildly dilated. There is mild left ventricular hypertrophy of the basal-septal segment. Left ventricular diastolic parameters are indeterminate.  3. Right ventricular systolic function is normal. The right ventricular size is normal. There is mildly elevated pulmonary artery systolic pressure.  4. Left atrial size was  severely dilated.  5. The mitral valve is normal in structure. Mild mitral valve regurgitation. No evidence of mitral stenosis. Moderate mitral annular calcification.  6. The aortic valve is calcified. Aortic valve regurgitation is trivial. Severe aortic valve stenosis.  7. The inferior vena cava is normal in size with <50% respiratory variability, suggesting right atrial pressure of 8 mmHg. Comparison(s): No prior Echocardiogram. FINDINGS  Left Ventricle: Left ventricular ejection fraction, by estimation, is 50 to  55%. The left ventricle has low normal function. The left ventricle demonstrates regional wall motion abnormalities. Definity contrast agent was given IV to delineate the left ventricular endocardial borders. The left ventricular internal cavity size was mildly dilated. There is mild left ventricular hypertrophy of the basal-septal segment. Left ventricular diastolic parameters are indeterminate. Right Ventricle: The right ventricular size is normal. Right ventricular systolic function is normal. There is mildly elevated pulmonary artery systolic pressure. The tricuspid regurgitant velocity is 3.05 m/s, and with an assumed right atrial pressure of 3 mmHg, the estimated right ventricular systolic pressure is 40.2 mmHg. Left Atrium: Left atrial size was severely dilated. Right Atrium: Right atrial size was normal in size. Pericardium: There is no evidence of pericardial effusion. Mitral Valve: The mitral valve is normal in structure. Moderate mitral annular calcification. Mild mitral valve regurgitation. No evidence of mitral valve stenosis. MV peak gradient, 11.4 mmHg. The mean mitral valve gradient is 4.0 mmHg. Tricuspid Valve: The tricuspid valve is normal in structure. Tricuspid valve regurgitation is trivial. No evidence of tricuspid stenosis. Aortic Valve: The aortic valve is calcified. Aortic valve regurgitation is trivial. Aortic regurgitation PHT measures 243 msec. Severe aortic stenosis is present. Aortic valve mean gradient measures 48.0 mmHg. Aortic valve peak gradient measures 81.0 mmHg. Aortic valve area, by VTI measures 0.44 cm. Pulmonic Valve: The pulmonic valve was normal in structure. Pulmonic valve regurgitation is not visualized. No evidence of pulmonic stenosis. Aorta: The aortic root is normal in size and structure. Venous: The inferior vena cava is normal in size with less than 50% respiratory variability, suggesting right atrial pressure of 8 mmHg. IAS/Shunts: No atrial level shunt detected by  color flow Doppler. Additional Comments: Hypokinesis of the distal septum and apex with overall low normal LV function.  LEFT VENTRICLE PLAX 2D LVIDd:         5.90 cm      Diastology LVIDs:         3.90 cm      LV e' medial:    2.50 cm/s LV PW:         0.80 cm      LV E/e' medial:  46.0 LV IVS:        0.90 cm      LV e' lateral:   6.85 cm/s LVOT diam:     2.00 cm      LV E/e' lateral: 16.8 LV SV:         50 LV SV Index:   27 LVOT Area:     3.14 cm  LV Volumes (MOD) LV vol d, MOD A2C: 179.0 ml LV vol d, MOD A4C: 180.0 ml LV vol s, MOD A2C: 83.1 ml LV vol s, MOD A4C: 81.2 ml LV SV MOD A2C:     95.9 ml LV SV MOD A4C:     180.0 ml LV SV MOD BP:      97.5 ml RIGHT VENTRICLE             IVC RV Basal diam:  3.20  cm     IVC diam: 1.70 cm RV S prime:     17.90 cm/s TAPSE (M-mode): 2.0 cm RVSP:           45.2 mmHg LEFT ATRIUM             Index        RIGHT ATRIUM           Index LA diam:        4.40 cm 2.36 cm/m   RA Pressure: 8.00 mmHg LA Vol (A2C):   98.5 ml 52.90 ml/m  RA Area:     10.40 cm LA Vol (A4C):   85.8 ml 46.08 ml/m  RA Volume:   22.80 ml  12.25 ml/m LA Biplane Vol: 99.2 ml 53.28 ml/m  AORTIC VALVE                     PULMONIC VALVE AV Area (Vmax):    0.51 cm      PV Vmax:          1.02 m/s AV Area (Vmean):   0.42 cm      PV Peak grad:     4.2 mmHg AV Area (VTI):     0.44 cm      PR End Diast Vel: 1.08 msec AV Vmax:           450.00 cm/s AV Vmean:          331.000 cm/s AV VTI:            1.140 m AV Peak Grad:      81.0 mmHg AV Mean Grad:      48.0 mmHg LVOT Vmax:         72.70 cm/s LVOT Vmean:        44.400 cm/s LVOT VTI:          0.158 m LVOT/AV VTI ratio: 0.14 AI PHT:            243 msec  AORTA Ao Root diam: 2.80 cm Ao Asc diam:  3.10 cm MITRAL VALVE                TRICUSPID VALVE MV Area (PHT): 5.42 cm     TR Peak grad:   37.2 mmHg MV Area VTI:   2.48 cm     TR Vmax:        305.00 cm/s MV Peak grad:  11.4 mmHg    Estimated RAP:  8.00 mmHg MV Mean grad:  4.0 mmHg     RVSP:           45.2 mmHg MV  Vmax:       1.69 m/s MV Vmean:      90.3 cm/s    SHUNTS MV Decel Time: 140 msec     Systemic VTI:  0.16 m MR Peak grad: 146.4 mmHg    Systemic Diam: 2.00 cm MR Mean grad: 89.0 mmHg MR Vmax:      605.00 cm/s MR Vmean:     438.0 cm/s MV E velocity: 115.00 cm/s MV A velocity: 103.00 cm/s MV E/A ratio:  1.12 Redell Shallow MD Electronically signed by Redell Shallow MD Signature Date/Time: 10/11/2024/4:06:58 PM    Final    DG Chest Port 1 View Result Date: 10/11/2024 EXAM: 1 VIEW(S) XRAY OF THE CHEST 10/11/2024 06:36:14 AM COMPARISON: PA and lateral radiographs of the chest dated 03/10/2023. CLINICAL HISTORY: sob. Sob; rover. FINDINGS: LUNGS AND PLEURA: Diffuse interstitial prominence. Possible trace right pleural  effusion. No focal pulmonary opacity. No pulmonary edema. No pneumothorax. HEART AND MEDIASTINUM: Heart size is at the upper limits of normal. Aortic atherosclerosis. No other acute abnormality of the cardiac and mediastinal silhouettes. BONES AND SOFT TISSUES: No acute osseous abnormality. IMPRESSION: 1. Diffuse interstitial prominence/edema and possible trace right pleural effusion. 2. Heart size at upper limits of normal. Aortic atherosclerosis. Electronically signed by: Evalene Coho MD 10/11/2024 07:00 AM EDT RP Workstation: [id number]    STS Procedure Type: Isolated AVR Perioperative Outcome Estimate % Operative Mortality 9.74% Morbidity & Mortality 20.1% Stroke 3.14% Renal Failure 5.25% Reoperation 3.59% Prolonged Ventilation 13.3% Deep Sternal Wound Infection 0.149% Long Hospital Stay (>14 days) 10.1% Short Hospital Stay (<6 days)* 17.5%  ____________________  [place] Cardiomyopathy Questionnaire     10/12/2024   10:43 AM  KCCQ-12  1 a. Ability to shower/bathe Not at all limited  1 b. Ability to walk 1 block Moderately limited  1 c. Ability to hurry/jog Other, Did not do  2. Edema feet/ankles/legs 1-2 times a week  3. Limited by fatigue All of the time  4. Limited  by dyspnea 3+ times a week, not every day  5. Sitting up / on 3+ pillows Every night  6. Limited enjoyment of life Extremely limited  7. Rest of life w/ symptoms Not at all satisfied  8 a. Participation in hobbies Severely limited  8 b. Participation in chores Severely limited  8 c. Visiting family/friends Severely limited     ________________________  Pre Surgical Assessment: 5 M Walk Test  44M=16.68ft  5 Meter Walk Test- trial 1: 6 seconds 5 Meter Walk Test- trial 2: 5.5 seconds 5 Meter Walk Test- trial 3: 5 seconds 5 Meter Walk Test Average: 5.5 seconds     Assessment and Plan:   KY MOSKOWITZ is a 83 y.o. female with symptoms of severe, stage D1 aortic stenosis with NYHA Class IV symptoms currently admitted with new onset at CHF with mild LV dysfunction and severe, if not critical AS.   Echo 10/11/24 showed EF 50-55%, with hypokinesis of the distal septum and apex, mild LV dilation, mildly elevated pulm pressure, moderate MAC with mild MR and severe aortic stenosis with a mean gradient of 44.4 mm hg, Vmax 4.5 m/s, AVA 0.42, DVI 0.14, SVI 27.    I have reviewed the natural history of aortic stenosis with the patient. We have discussed the limitations of medical therapy and the poor prognosis associated with symptomatic aortic stenosis. We have reviewed potential treatment options, including palliative medical therapy, conventional surgical aortic valve replacement, and transcatheter aortic valve replacement. We discussed treatment options in the context of this patient's specific comorbid medical conditions.    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  TAVR seems like a reasonable treatment option for this patient pending formal cardiac surgical consultation. We discussed typical evaluation which will require a gated cardiac CTA and a CTA of  the chest/abdomen/pelvis to evaluate both his cardiac anatomy and peripheral vasculature. Follow-up testing will be arranged as an outpatient.   Plan for [facility] today today assess filling pressures, coronary arteries and AoV.  We will follow along with you.     For questions or updates, please contact [facility] Please consult [email or web address] for contact info under    Signed, Lamarr Hummer, PA-C  10/12/2024 10:51 AM   [name] JONELLE [name] was seen by  me today along with Lamarr Hummer, PA-C. I have personally performed an evaluation on this patient.  My findings are as follows: 83 y.o. female with history of RA, CKD stage IIIa, HTN, HLD, essential thrombocytosis, hyponatremia admitted with dyspnea and found to have acute CHF and severe aortic stenosis. Echo with LVEF=50-55%, normal RV function. Mild MR. Severe AS with mean gradient 48 mmHg, AVA 0.42 cm2, SVI 27, DI 0.14.    Data: EKG(s) and pertinent labs, studies, etc were personally reviewed and interpreted by me: sinus tachycardia, with PVCs Tele: sinus tachycardia Labs reviewed by me.  Otherwise, I agree with data as outlined by the advanced practice provider.  Exam performed by me: Gen: NAD Neck: No JVD Cardiac: tachy, regular, loud harsh systolic murmur Lungs: clear bilaterally Extremities: no LE edema  My Assessment and Plan:  Severe aortic stenosis: She has severe, stage D aortic valve stenosis. NYHA class IV symptoms. I have personally reviewed the echo images. The aortic valve is thickened and calcified with limited leaflet mobility. I think she would benefit from AVR. Given advanced age, she is not a good candidate for conventional AVR by surgical approach. I think she may be a good candidate for TAVR.   I have reviewed the natural history of aortic stenosis with the patient and their family members  who are present today. We have discussed the limitations of medical therapy and the poor prognosis associated  with symptomatic aortic stenosis. We have reviewed potential treatment options, including palliative medical therapy, conventional surgical aortic valve replacement, and transcatheter aortic valve replacement. We discussed treatment options in the context of the patient's specific comorbid medical conditions.   She would like to proceed with planning for TAVR. Right heart catheterization and coronary angiography today. Risks and benefits of the valve procedure are reviewed with the patient. After the cath, she will have a cardiac CT, CTA of the chest/abdomen and pelvis and she will then be referred to see one of the CT surgeons on our TAVR team.   Signed,  [name], MD  10/12/2024 4:03 PM

## 2024-10-12 NOTE — Evaluation (Signed)
 Occupational Therapy Evaluation Patient Details Name: Rhonda [name] MRN: [id number] DOB: August 06, 1941 Today's Date: 10/12/2024   History of Present Illness   Pt is an 83 y/o female admitted with acute CHF exacerbation and acute respiratory failure with hypoxia. Pending cardiac catheterization 10/16. PMH: RA, essential thrombocytosis, GERD, CHF and HTN     Clinical Impressions PTA, pt lives with spouse and typically completely independent with ADLs, IADLs and mobility without need for AD. Pt presents now with deficits in cardiopulmonary endurance and dynamic standing balance. Pt able to manage ADLs with Supervision and hallway mobility with CGA w/o AD. Pt with minor sway during mobility but able to self correct. SpO2 92% on RA with mobility though 2/4 DOE noted. Initiated energy conservation strategy and breathing technique education. Anticipate no OT needs upon DC but will continue to follow acutely to progress ADL/mobility endurance and independence.  HR 112bpm     If plan is discharge home, recommend the following:   Assist for transportation;Other (comment) (PRN)     Functional Status Assessment   Patient has had a recent decline in their functional status and demonstrates the ability to make significant improvements in function in a reasonable and predictable amount of time.     Equipment Recommendations   None recommended by OT     Recommendations for Other Services         Precautions/Restrictions   Precautions Precautions: Fall;Other (comment) Recall of Precautions/Restrictions: Intact Precaution/Restrictions Comments: monitor O2/DOE Restrictions Weight Bearing Restrictions Per Provider Order: No     Mobility Bed Mobility Overal bed mobility: Modified Independent                  Transfers Overall transfer level: Needs assistance Equipment used: None Transfers: Sit to/from Stand Sit to Stand: Supervision                  Balance  Overall balance assessment: Mild deficits observed, not formally tested                                         ADL either performed or assessed with clinical judgement   ADL Overall ADL's : Needs assistance/impaired Eating/Feeding: Independent Eating/Feeding Details (indicate cue type and reason): anticipate no issues but NPO at time of eval Grooming: Standing;Modified independent;Wash/dry hands;Brushing hair Grooming Details (indicate cue type and reason): no issues leaning forward to brush hair and place back in clip Upper Body Bathing: Modified independent   Lower Body Bathing: Supervison/ safety;Sitting/lateral leans;Sit to/from stand   Upper Body Dressing : Modified independent   Lower Body Dressing: Supervision/safety;Sitting/lateral leans;Sit to/from stand   Toilet Transfer: Supervision/safety;Ambulation;Regular Teacher, adult education Details (indicate cue type and reason): no AD, minor sway Toileting- Clothing Manipulation and Hygiene: Supervision/safety       Functional mobility during ADLs: Contact guard assist General ADL Comments: minor sway with mobility but able to correct and no AD needed     Vision Ability to See in Adequate Light: 0 Adequate Patient Visual Report: No change from baseline Vision Assessment?: No apparent visual deficits;Wears glasses for reading     Perception         Praxis         Pertinent Vitals/Pain Pain Assessment Pain Assessment: No/denies pain     Extremity/Trunk Assessment Upper Extremity Assessment Upper Extremity Assessment: Overall WFL for tasks assessed;Right hand dominant   Lower Extremity Assessment Lower  Extremity Assessment: Defer to PT evaluation   Cervical / Trunk Assessment Cervical / Trunk Assessment: Normal   Communication Communication Communication: No apparent difficulties   Cognition Arousal: Alert Behavior During Therapy: WFL for tasks assessed/performed Cognition: No apparent  impairments                               Following commands: Intact       Cueing  General Comments   Cueing Techniques: Verbal cues      Exercises     Shoulder Instructions      Home Living Family/patient expects to be discharged to:: Private residence Living Arrangements: Spouse/significant other (husband is 82 y/o) Available Help at Discharge: Family Type of Home: House Home Access: Stairs to enter [employer] of Steps: 2 at garage and 4 at the front Entrance Stairs-Rails: None (none at garage, some at front) Home Layout: Two level;Able to live on main level with bedroom/bathroom     Bathroom Shower/Tub: Tub/shower unit;Walk-in shower   Bathroom Toilet: Standard     Home Equipment: Agricultural consultant (2 wheels);Cane - single point   Additional Comments: one daughter lives next door, one lives 6 miles away      Prior Functioning/Environment Prior Level of Function : Independent/Modified Independent             Mobility Comments: no AD ADLs Comments: Does not drive (husband does). Indep with ADLs, IADLs (grocery shoppping, etc). Helps husband with donning his compression stockings    OT Problem List: Impaired balance (sitting and/or standing);Decreased activity tolerance;Cardiopulmonary status limiting activity   OT Treatment/Interventions: Self-care/ADL training;Therapeutic exercise;Energy conservation;DME and/or AE instruction;Therapeutic activities;Patient/family education;Balance training      OT Goals(Current goals can be found in the care plan section)   Acute Rehab OT Goals Patient Stated Goal: improve breathing, hope cath goes well OT Goal Formulation: With patient Time For Goal Achievement: 10/26/24 Potential to Achieve Goals: Good ADL Goals Pt Will Transfer to Toilet: Independently;ambulating Additional ADL Goal #1: Pt to increase standing activity tolerance > 10 min during ADLs/mobility without need for seated rest  break Additional ADL Goal #2: Pt to verbalize at least 3 energy conservation strategies to implement during ADLs, IADLs and mobility   OT Frequency:  Min 2X/week    Co-evaluation              AM-PAC OT 6 Clicks Daily Activity     Outcome Measure Help from another person eating meals?: None Help from another person taking care of personal grooming?: None Help from another person toileting, which includes using toliet, bedpan, or urinal?: A Little Help from another person bathing (including washing, rinsing, drying)?: A Little Help from another person to put on and taking off regular upper body clothing?: None Help from another person to put on and taking off regular lower body clothing?: A Little 6 Click Score: 21   End of Session Nurse Communication: Mobility status;Other (comment) (O2)  Activity Tolerance: Patient tolerated treatment well Patient left: in bed;with call bell/phone within reach  OT Visit Diagnosis: Unsteadiness on feet (R26.81)                Time: 9275-9251 OT Time Calculation (min): 24 min Charges:  OT General Charges $OT Visit: 1 Visit OT Evaluation $OT Eval Low Complexity: 1 Low  Mliss NOVAK, OTR/L Acute Rehab Services Office: [phone]   Mliss Fish 10/12/2024, 8:01 AM

## 2024-10-12 NOTE — Progress Notes (Signed)
 Mobility Specialist Progress Note:    10/12/24 1338  Mobility  Activity Ambulated with assistance  Level of Assistance Standby assist, set-up cues, supervision of patient - no hands on  Assistive Device None  Distance Ambulated (ft) 200 ft  Activity Response Tolerated well  Mobility Referral Yes  Mobility visit 1 Mobility  Mobility Specialist Start Time (ACUTE ONLY) 1338  Mobility Specialist Stop Time (ACUTE ONLY) 1345  Mobility Specialist Time Calculation (min) (ACUTE ONLY) 7 min   Received pt ambulating in room w/ daughter agreeable to session. No c/o any symptoms. Pt feeling anxious about this afternoon procedure but otherwise is doing well. Pt moving and ambulating well. Returned pt to room w/ daughter and all needs met.   Venetia Keel Mobility Specialist Please Neurosurgeon or Rehab Office at [phone]

## 2024-10-12 NOTE — Evaluation (Signed)
 Physical Therapy Brief Evaluation and Discharge Note Patient Details Name: Rhonda [name] MRN: [id number] DOB: 05-24-1941 Today's Date: 10/12/2024   History of Present Illness  83 y/o F adm 10/11/24 with acute CHF exacerbation and respiratory failure with hypoxia. Pending cardiac catheterization 10/16. PMH: RA, essential thrombocytosis, GERD, CHF, HTN, Lt THA  Clinical Impression  Pt very pleasant reporting she has lived throughout the [place] , [place] and [place] and is normally independent with spouse and daughters available to assist. Pt with HR 110-118 during gait and SPO2 95-97% on RA. Pt reports fatigue with activity with education for progressive walking program, monitoring with home pulse ox and resting with elevated RPE then resuming activity. Pt without further acute therapy needs, encouraged ambulation and will defer to mobility specialists.       PT Assessment Patient does not need any further PT services  Assistance Needed at Discharge  PRN    Equipment Recommendations None recommended by PT  Recommendations for Other Services       Precautions/Restrictions Precautions Precautions: None        Mobility  Bed Mobility Rolling: Modified independent (Device/Increase time)        Transfers Overall transfer level: Modified independent                      Ambulation/Gait Ambulation/Gait assistance: Modified independent (Device/Increase time) Gait Distance (Feet): 300 Feet Assistive device: None Gait Pattern/deviations: WFL(Within Functional Limits) Gait Speed: Pace WFL    Home Activity Instructions    Stairs Stairs: Yes Stairs assistance: Modified independent (Device/Increase time) Stair Management: Alternating pattern, Forwards, No rails Number of Stairs: 3    Modified Rankin (Stroke Patients Only)        Balance Overall balance assessment: No apparent balance deficits (not formally assessed)                        Pertinent  Vitals/Pain PT - Brief Vital Signs All Vital Signs Stable: Yes Pain Assessment Pain Assessment: No/denies pain     Home Living Family/patient expects to be discharged to:: Private residence Living Arrangements: Spouse/significant other Available Help at Discharge: Family;Available 24 hours/day Home Environment: Stairs to enter  [employer] of Steps: 3 Home Equipment: Agricultural consultant (2 wheels);Cane - single point   Additional Comments: one daughter lives next door, one lives 6 miles away    Prior Function Level of Independence: Independent      UE/LE Assessment   UE ROM/Strength/Tone/Coordination: WFL    LE ROM/Strength/Tone/Coordination: [facility]      Communication   Communication Communication: No apparent difficulties     Cognition Overall Cognitive Status: Appears within functional limits for tasks assessed/performed       General Comments      Exercises     Assessment/Plan    PT Problem List         PT Visit Diagnosis Other abnormalities of gait and mobility (R26.89)    No Skilled PT All education completed;Patient will have necessary level of assist by caregiver at discharge;Patient is modified independent with all activity/mobility   Co-evaluation                AMPAC 6 Clicks Help needed turning from your back to your side while in a flat bed without using bedrails?: None Help needed moving from lying on your back to sitting on the side of a flat bed without using bedrails?: None Help needed moving to and from a bed to a  chair (including a wheelchair)?: None Help needed standing up from a chair using your arms (e.g., wheelchair or bedside chair)?: None Help needed to walk in hospital room?: None Help needed climbing 3-5 steps with a railing? : None 6 Click Score: 24      End of Session   Activity Tolerance: Patient tolerated treatment well Patient left: in chair;with call bell/phone within reach Nurse Communication: Mobility status PT  Visit Diagnosis: Other abnormalities of gait and mobility (R26.89)     Time: 9056-9041 PT Time Calculation (min) (ACUTE ONLY): 15 min  Charges:   PT Evaluation $PT Eval Low Complexity: 1 Low      Fares Ramthun P, PT Acute Rehabilitation Services Office: [phone]   Lenoard NOVAK Koston Hennes  10/12/2024, 11:44 AM

## 2024-10-13 ENCOUNTER — Other Ambulatory Visit: Payer: Self-pay

## 2024-10-13 DIAGNOSIS — I350 Nonrheumatic aortic (valve) stenosis: Secondary | ICD-10-CM | POA: Diagnosis not present

## 2024-10-13 DIAGNOSIS — M069 Rheumatoid arthritis, unspecified: Secondary | ICD-10-CM | POA: Diagnosis not present

## 2024-10-13 DIAGNOSIS — E871 Hypo-osmolality and hyponatremia: Secondary | ICD-10-CM

## 2024-10-13 DIAGNOSIS — I10 Essential (primary) hypertension: Secondary | ICD-10-CM | POA: Diagnosis not present

## 2024-10-13 DIAGNOSIS — E039 Hypothyroidism, unspecified: Secondary | ICD-10-CM | POA: Diagnosis not present

## 2024-10-13 DIAGNOSIS — I5033 Acute on chronic diastolic (congestive) heart failure: Secondary | ICD-10-CM | POA: Diagnosis not present

## 2024-10-13 LAB — CBC
HCT: 28.5 % — ABNORMAL LOW (ref 36.0–46.0)
Hemoglobin: 9.6 g/dL — ABNORMAL LOW (ref 12.0–15.0)
MCH: 31.6 pg (ref 26.0–34.0)
MCHC: 33.7 g/dL (ref 30.0–36.0)
MCV: 93.8 fL (ref 80.0–100.0)
Platelets: 297 K/uL (ref 150–400)
RBC: 3.04 MIL/uL — ABNORMAL LOW (ref 3.87–5.11)
RDW: 17.4 % — ABNORMAL HIGH (ref 11.5–15.5)
WBC: 10.9 K/uL — ABNORMAL HIGH (ref 4.0–10.5)
nRBC: 0 % (ref 0.0–0.2)

## 2024-10-13 LAB — BASIC METABOLIC PANEL WITH GFR
Anion gap: 12 (ref 5–15)
BUN: 19 mg/dL (ref 8–23)
CO2: 25 mmol/L (ref 22–32)
Calcium: 8.8 mg/dL — ABNORMAL LOW (ref 8.9–10.3)
Chloride: 98 mmol/L (ref 98–111)
Creatinine, Ser: 1.11 mg/dL — ABNORMAL HIGH (ref 0.44–1.00)
GFR, Estimated: 49 mL/min — ABNORMAL LOW (ref >60)
Glucose, Bld: 99 mg/dL (ref 70–99)
Potassium: 3.9 mmol/L (ref 3.5–5.1)
Sodium: 135 mmol/L (ref 135–145)

## 2024-10-13 LAB — MAGNESIUM: Magnesium: 1.8 mg/dL (ref 1.7–2.4)

## 2024-10-13 LAB — GLUCOSE, CAPILLARY: Glucose-Capillary: 104 mg/dL — ABNORMAL HIGH (ref 70–99)

## 2024-10-13 MED ORDER — METOPROLOL TARTRATE 50 MG PO TABS: 50.0000 mg | ORAL_TABLET | Freq: Once | ORAL | 0 refills | Status: DC

## 2024-10-13 MED ORDER — FUROSEMIDE 20 MG PO TABS
20.0000 mg | ORAL_TABLET | Freq: Every day | ORAL | Status: DC
  Administered 2024-10-13 – 2024-10-14 (×2): 20 mg via ORAL
  Filled 2024-10-13 (×2): qty 1

## 2024-10-13 NOTE — Plan of Care (Signed)

## 2024-10-13 NOTE — Assessment & Plan Note (Signed)
 Renal function with serum cr at 1.1 with K at 3,9 and serum bicarbonate at 25 Na 135 Mg 1.8  Follow up renal function and electrolytes in am Transitioned to po furosemide

## 2024-10-13 NOTE — Progress Notes (Signed)
 Mobility Specialist Progress Note:    10/13/24 1045  Mobility  Activity Ambulated with assistance  Level of Assistance Standby assist, set-up cues, supervision of patient - no hands on  Assistive Device None  Distance Ambulated (ft) 500 ft  Activity Response Tolerated well  Mobility Referral Yes  Mobility visit 1 Mobility  Mobility Specialist Start Time (ACUTE ONLY) 1045  Mobility Specialist Stop Time (ACUTE ONLY) 1055  Mobility Specialist Time Calculation (min) (ACUTE ONLY) 10 min   Received pt laying in bed pleasant and agreeable to session. No c/o any symptoms. Pt moving and ambulating well. Requested to ambulate another session later today. Returned pt to room w/ all needs met.   Venetia Keel Mobility Specialist Please Neurosurgeon or Rehab Office at [phone]

## 2024-10-13 NOTE — Progress Notes (Signed)
 Heart Failure Navigator Progress Note  Assessed for Heart & Vascular TOC clinic readiness.  Patient does not meet criteria due to EF 50-55%, has a scheduled TCTS appointment for a TAVR evaluation on 10/25/2024. No HF TOC. .   Navigator will sign off at this time.  Stephane Haddock, BSN, Scientist, clinical (histocompatibility and immunogenetics) Only

## 2024-10-13 NOTE — Progress Notes (Addendum)
 Progress Note   Patient: Rhonda [name] FMW:[id number] DOB: 1941/06/19 DOA: 10/11/2024     2 DOS: the patient was seen and examined on 10/13/2024   Brief hospital course: Rhonda [name] was admitted to the hospital with the working diagnosis of heart failure exacerbation.   83 yo female with the past medical history of rheumatoid arthritis, hypertension, thrombocytosis, who presented with dyspnea. Reported worsening symptoms for the last 2 to 3 days prior to admission, associated with orthopnea and lower extremity edema. The night prior to admission had a severe episode of PND, EMS was called and she was found in respiratory distress with a 02 saturation of 70% on room air. She was placed on Cpap and transported to the ED.  On her initial physical examination her blood pressure was 118/67, HR 95, RR 2 and 02 saturation 100% on supplemental 02 per [place]  Lungs with no wheezing or rhonchi, heart with S1 and S2 present and regular with no gallops or murmurs, abdomen with no distention and positive lower extremity edema.   Assessment and Plan: * Acute on chronic diastolic CHF (congestive heart failure) (HCC) Echocardiogram with preserved LV systolic function with EF 50 to 55%, mild dilatated LV cavity, mild LVH, mild left LVH, basal septal segment. RV systolic function preserved, mild mitral valve regurgitation, severe aortic valve stenosis.  Hypokinesis of the distal septum and apex.   10/16 cardiac catheterization  Mild non obstructive coronary artery disease RA 12/9 RV 37/8 PA mean 28  PCWP 20  Cardiac output 4.9 and index 2.7 ([place])   Urine output is 1600 ml  Systolic blood pressure 120 mmHg   Possible resumption ABR at the time of discharge.  Continue diuresis with furosemide 20 mg po daily.   Acute hypoxemic respiratory failure due to acute cardiogenic pulmonary edema Clinically resolved   HTN (hypertension) Continue blood pressure monitoring  Continue to hold on blood pressure  meds for now.   Hypothyroidism Continue levothyroxine    Rheumatoid arthritis (HCC) Appears stable  On plaquenil  and methotrexate  Titrate use with concern for CHF    Essential thrombocytosis (HCC) Follow up cell count    Hyperglycemia Hgb A1c 5.6    Hyponatremia Renal function with serum cr at 1.1 with K at 3,9 and serum bicarbonate at 25 Na 135 Mg 1.8  Follow up renal function and electrolytes in am Transitioned to po furosemide   Obesity, class 1 Calculated BMI is 31.7        Subjective: Patient is feeling better, no chest pain, dyspnea has improved, no lower extremity edema, PND or orthopnea   Physical Exam: Vitals:   10/13/24 0537 10/13/24 0541 10/13/24 0733 10/13/24 1112  BP: 121/62  125/63 128/78  Pulse: 95  93 (!) 49  Resp: 16  16 16   Temp: 97.8 F (36.6 C)  98.2 F (36.8 C) (!) 97.5 F (36.4 C)  TempSrc: Oral  Oral Oral  SpO2: 93%  96% 97%  Weight:  76.4 kg    Height:       Neurology awake and alert ENT with mild pallor Cardiovascular with S1 and S2 present and regular, positive systolic murmur at the base crescendo decrescendo  Respiratory with no rales or wheezing, no rhonchi  Abdomen with no distention  No lower extremity edema   Data Reviewed:    Family Communication: no family at the bedside   Disposition: Status is: Inpatient Remains inpatient appropriate because: recovering heart failure   Planned Discharge Destination: home  Author: Elidia Toribio Furnace, MD 10/13/2024 4:48 PM  For on call review [email or web address].

## 2024-10-13 NOTE — Progress Notes (Signed)
 Rounding Note   Patient Name: Rhonda [name] Date of Encounter: 10/13/2024  [facility] Cardiologist: Lonni LITTIE Nanas, MD   Subjective BP 125/63.  Creatinine 1.11.  Net -1L yesterday. Denies any chest pain or dyspnea  Scheduled Meds:  enoxaparin (LOVENOX) injection  40 mg Subcutaneous Q24H   furosemide  40 mg Intravenous Daily   sodium chloride  flush  3 mL Intravenous Q12H   Continuous Infusions:  sodium chloride      PRN Meds: sodium chloride , ondansetron  **OR** ondansetron  (ZOFRAN ) IV, sodium chloride  flush   Vital Signs  Vitals:   10/13/24 0005 10/13/24 0537 10/13/24 0541 10/13/24 0733  BP: (!) 118/48 121/62  125/63  Pulse: 91 95  93  Resp: 16 16  16   Temp: 98.1 F (36.7 C) 97.8 F (36.6 C)  98.2 F (36.8 C)  TempSrc: Oral Oral  Oral  SpO2: 93% 93%  96%  Weight:   76.4 kg   Height:        Intake/Output Summary (Last 24 hours) at 10/13/2024 0902 Last data filed at 10/13/2024 0436 Gross per 24 hour  Intake 600 ml  Output 1600 ml  Net -1000 ml      10/13/2024    5:41 AM 10/12/2024   12:11 AM 10/11/2024    5:07 PM  Last 3 Weights  Weight (lbs) 168 lb 6.9 oz 173 lb 9.6 oz 174 lb 4.8 oz  Weight (kg) 76.4 kg 78.744 kg 79.062 kg      Telemetry NSR WITH PACS, NSVT x 9 beats - Personally Reviewed  ECG  No new ECG - Personally Reviewed  Physical Exam  GEN: No acute distress.   Neck: No JVD Cardiac: RRR, 3 out of 6 systolic murmur Respiratory: Clear to auscultation bilaterally. GI: Soft, nontender, non-distended  MS: No edema Neuro:  Nonfocal  Psych: Normal affect   Labs High Sensitivity Troponin:   Recent Labs  Lab 10/11/24 0615 10/11/24 0833 10/11/24 0952 10/11/24 1204  TROPONINIHS 9 26* 38* 53*     Chemistry Recent Labs  Lab 10/11/24 0615 10/12/24 0245 10/12/24 1504 10/12/24 1505 10/13/24 0229  NA 131* 133* 127* 131* 135  K 4.6 4.3 3.6 4.2 3.9  CL 99 98  --   --  98  CO2 20* 25  --   --  25  GLUCOSE 206* 105*   --   --  99  BUN 19 18  --   --  19  CREATININE 1.08* 1.03*  --   --  1.11*  CALCIUM 8.3* 8.4*  --   --  8.8*  MG  --  1.9  --   --  1.8  PROT 6.4* 5.9*  --   --   --   ALBUMIN  3.4* 3.1*  --   --   --   AST 41 25  --   --   --   ALT 30 23  --   --   --   ALKPHOS 72 58  --   --   --   BILITOT 0.7 0.5  --   --   --   GFRNONAA 51* 54*  --   --  49*  ANIONGAP 12 10  --   --  12    Lipids  Recent Labs  Lab 10/12/24 0245  CHOL 113  TRIG 45  HDL 59  LDLCALC 45  CHOLHDL 1.9    Hematology Recent Labs  Lab 10/11/24 0615 10/12/24 0245 10/12/24 1504 10/12/24 1505 10/13/24 0229  WBC 19.1* 13.9*  --   --  10.9*  RBC 3.12* 2.88*  --   --  3.04*  HGB 9.9* 9.1* 9.2* 9.2* 9.6*  HCT 30.8* 27.6* 27.0* 27.0* 28.5*  MCV 98.7 95.8  --   --  93.8  MCH 31.7 31.6  --   --  31.6  MCHC 32.1 33.0  --   --  33.7  RDW 17.2* 17.2*  --   --  17.4*  PLT 389 275  --   --  297   Thyroid  No results for input(s): TSH, FREET4 in the last 168 hours.  BNP Recent Labs  Lab 10/11/24 0615  BNP 201.5*    DDimer  Recent Labs  Lab 10/11/24 0952  DDIMER 1.32*     Radiology  CARDIAC CATHETERIZATION Result Date: 10/12/2024   Prox RCA to Mid RCA lesion is 30% stenosed.   Ost Cx to Prox Cx lesion is 30% stenosed.   Prox LAD to Mid LAD lesion is 30% stenosed. Mild non-obstructive CAD Mild elevation right heart pressures Continue workup for TAVR. Medical management of CAD.   ECHOCARDIOGRAM COMPLETE Result Date: 10/11/2024    ECHOCARDIOGRAM REPORT   Patient Name:   Rhonda [name] Date of Exam: 10/11/2024 Medical Rec #:  [id number]      Height:       63.0 in Accession #:    [id number]     Weight:       183.0 lb Date of Birth:  May 30, 1941      BSA:          1.862 m Patient Age:    83 years       BP:           101/90 mmHg Patient Gender: F              HR:           99 bpm. Exam Location:  Inpatient Procedure: 2D Echo, Cardiac Doppler, Color Doppler and Intracardiac            Opacification Agent (Both  Spectral and Color Flow Doppler were            utilized during procedure). Indications:    CHF I50.9  History:        Patient has no prior history of Echocardiogram examinations.                 CHF, Signs/Symptoms:Murmur and Dyspnea; Risk                 Factors:Hypertension.  Sonographer:    BERNARDA ROCKS Referring Phys: [place] STEVEN J NEWTON IMPRESSIONS  1. Hypokinesis of the distal septum and apex with overall low normal LV function.  2. Left ventricular ejection fraction, by estimation, is 50 to 55%. The left ventricle has low normal function. The left ventricle demonstrates regional wall motion abnormalities (see scoring diagram/findings for description). The left ventricular internal cavity size was mildly dilated. There is mild left ventricular hypertrophy of the basal-septal segment. Left ventricular diastolic parameters are indeterminate.  3. Right ventricular systolic function is normal. The right ventricular size is normal. There is mildly elevated pulmonary artery systolic pressure.  4. Left atrial size was severely dilated.  5. The mitral valve is normal in structure. Mild mitral valve regurgitation. No evidence of mitral stenosis. Moderate mitral annular calcification.  6. The aortic valve is calcified. Aortic valve regurgitation is trivial. Severe aortic valve stenosis.  7. The inferior vena cava is normal  in size with <50% respiratory variability, suggesting right atrial pressure of 8 mmHg. Comparison(s): No prior Echocardiogram. FINDINGS  Left Ventricle: Left ventricular ejection fraction, by estimation, is 50 to 55%. The left ventricle has low normal function. The left ventricle demonstrates regional wall motion abnormalities. Definity contrast agent was given IV to delineate the left ventricular endocardial borders. The left ventricular internal cavity size was mildly dilated. There is mild left ventricular hypertrophy of the basal-septal segment. Left ventricular diastolic parameters are  indeterminate. Right Ventricle: The right ventricular size is normal. Right ventricular systolic function is normal. There is mildly elevated pulmonary artery systolic pressure. The tricuspid regurgitant velocity is 3.05 m/s, and with an assumed right atrial pressure of 3 mmHg, the estimated right ventricular systolic pressure is 40.2 mmHg. Left Atrium: Left atrial size was severely dilated. Right Atrium: Right atrial size was normal in size. Pericardium: There is no evidence of pericardial effusion. Mitral Valve: The mitral valve is normal in structure. Moderate mitral annular calcification. Mild mitral valve regurgitation. No evidence of mitral valve stenosis. MV peak gradient, 11.4 mmHg. The mean mitral valve gradient is 4.0 mmHg. Tricuspid Valve: The tricuspid valve is normal in structure. Tricuspid valve regurgitation is trivial. No evidence of tricuspid stenosis. Aortic Valve: The aortic valve is calcified. Aortic valve regurgitation is trivial. Aortic regurgitation PHT measures 243 msec. Severe aortic stenosis is present. Aortic valve mean gradient measures 48.0 mmHg. Aortic valve peak gradient measures 81.0 mmHg. Aortic valve area, by VTI measures 0.44 cm. Pulmonic Valve: The pulmonic valve was normal in structure. Pulmonic valve regurgitation is not visualized. No evidence of pulmonic stenosis. Aorta: The aortic root is normal in size and structure. Venous: The inferior vena cava is normal in size with less than 50% respiratory variability, suggesting right atrial pressure of 8 mmHg. IAS/Shunts: No atrial level shunt detected by color flow Doppler. Additional Comments: Hypokinesis of the distal septum and apex with overall low normal LV function.  LEFT VENTRICLE PLAX 2D LVIDd:         5.90 cm      Diastology LVIDs:         3.90 cm      LV e' medial:    2.50 cm/s LV PW:         0.80 cm      LV E/e' medial:  46.0 LV IVS:        0.90 cm      LV e' lateral:   6.85 cm/s LVOT diam:     2.00 cm      LV E/e'  lateral: 16.8 LV SV:         50 LV SV Index:   27 LVOT Area:     3.14 cm  LV Volumes (MOD) LV vol d, MOD A2C: 179.0 ml LV vol d, MOD A4C: 180.0 ml LV vol s, MOD A2C: 83.1 ml LV vol s, MOD A4C: 81.2 ml LV SV MOD A2C:     95.9 ml LV SV MOD A4C:     180.0 ml LV SV MOD BP:      97.5 ml RIGHT VENTRICLE             IVC RV Basal diam:  3.20 cm     IVC diam: 1.70 cm RV S prime:     17.90 cm/s TAPSE (M-mode): 2.0 cm RVSP:           45.2 mmHg LEFT ATRIUM  Index        RIGHT ATRIUM           Index LA diam:        4.40 cm 2.36 cm/m   RA Pressure: 8.00 mmHg LA Vol (A2C):   98.5 ml 52.90 ml/m  RA Area:     10.40 cm LA Vol (A4C):   85.8 ml 46.08 ml/m  RA Volume:   22.80 ml  12.25 ml/m LA Biplane Vol: 99.2 ml 53.28 ml/m  AORTIC VALVE                     PULMONIC VALVE AV Area (Vmax):    0.51 cm      PV Vmax:          1.02 m/s AV Area (Vmean):   0.42 cm      PV Peak grad:     4.2 mmHg AV Area (VTI):     0.44 cm      PR End Diast Vel: 1.08 msec AV Vmax:           450.00 cm/s AV Vmean:          331.000 cm/s AV VTI:            1.140 m AV Peak Grad:      81.0 mmHg AV Mean Grad:      48.0 mmHg LVOT Vmax:         72.70 cm/s LVOT Vmean:        44.400 cm/s LVOT VTI:          0.158 m LVOT/AV VTI ratio: 0.14 AI PHT:            243 msec  AORTA Ao Root diam: 2.80 cm Ao Asc diam:  3.10 cm MITRAL VALVE                TRICUSPID VALVE MV Area (PHT): 5.42 cm     TR Peak grad:   37.2 mmHg MV Area VTI:   2.48 cm     TR Vmax:        305.00 cm/s MV Peak grad:  11.4 mmHg    Estimated RAP:  8.00 mmHg MV Mean grad:  4.0 mmHg     RVSP:           45.2 mmHg MV Vmax:       1.69 m/s MV Vmean:      90.3 cm/s    SHUNTS MV Decel Time: 140 msec     Systemic VTI:  0.16 m MR Peak grad: 146.4 mmHg    Systemic Diam: 2.00 cm MR Mean grad: 89.0 mmHg MR Vmax:      605.00 cm/s MR Vmean:     438.0 cm/s MV E velocity: 115.00 cm/s MV A velocity: 103.00 cm/s MV E/A ratio:  1.12 Redell Shallow MD Electronically signed by Redell Shallow MD Signature  Date/Time: 10/11/2024/4:06:58 PM    Final     Cardiac Studies Echo:  1. Hypokinesis of the distal septum and apex with overall low normal LV  function.   2. Left ventricular ejection fraction, by estimation, is 50 to 55%. The  left ventricle has low normal function. The left ventricle demonstrates  regional wall motion abnormalities (see scoring diagram/findings for  description). The left ventricular  internal cavity size was mildly dilated. There is mild left ventricular  hypertrophy of the basal-septal segment. Left ventricular diastolic  parameters are indeterminate.   3. Right ventricular systolic function is normal.  The right ventricular  size is normal. There is mildly elevated pulmonary artery systolic  pressure.   4. Left atrial size was severely dilated.   5. The mitral valve is normal in structure. Mild mitral valve  regurgitation. No evidence of mitral stenosis. Moderate mitral annular  calcification.   6. The aortic valve is calcified. Aortic valve regurgitation is trivial.  Severe aortic valve stenosis.   7. The inferior vena cava is normal in size with <50% respiratory  variability, suggesting right atrial pressure of 8 mmHg.   Patient Profile   83 y.o. female with history of rheumatoid arthritis, essential thrombocytosis, hypertension who we are consulted for evaluation of suspected heart failure   Assessment & Plan  Aortic stenosis: Presented with shortness of breath, echo showed severe aortic stenosis.  On review of echo, AS appears critical. LHC/RHC showed nonobstructive CAD with mild elevation in filling pressures (RA 9 RV 37/8 PA 39/20 PCWP 20 CI 2.8).   -Discussed with structural heart team: planning exepedited TAVR eval, will arrange outpatient TAVR Cts and close f/u  Acute on chronic diastolic heart failure: Presented with volume overload.  Echo shows EF 50 to 55%, normal RV function, severe left atrial enlargement, severe aortic stenosis, RAP 8.  Mild  elevation in filling pressures as above -Currently appears euvolemic, will switch to PO lasix.  Hypertension: On olmesartan 20 mg daily and amlodipine  5 mg daily prior to admission.  BP soft, currently holding antihypertensives  Disposition: will switch to PO lasix today, if maintaining euvolemia on PO lasix can plan for discharge tomorrow, will have close f/u with structural heart team  For questions or updates, please contact [facility] Please consult [email or web address] for contact info under      Signed, Lonni LITTIE Nanas, MD  10/13/2024, 9:02 AM

## 2024-10-14 ENCOUNTER — Other Ambulatory Visit (HOSPITAL_COMMUNITY): Payer: Self-pay

## 2024-10-14 DIAGNOSIS — I10 Essential (primary) hypertension: Secondary | ICD-10-CM | POA: Diagnosis not present

## 2024-10-14 DIAGNOSIS — I5033 Acute on chronic diastolic (congestive) heart failure: Secondary | ICD-10-CM | POA: Diagnosis not present

## 2024-10-14 DIAGNOSIS — E039 Hypothyroidism, unspecified: Secondary | ICD-10-CM | POA: Diagnosis not present

## 2024-10-14 DIAGNOSIS — F32A Depression, unspecified: Secondary | ICD-10-CM

## 2024-10-14 DIAGNOSIS — M069 Rheumatoid arthritis, unspecified: Secondary | ICD-10-CM | POA: Diagnosis not present

## 2024-10-14 DIAGNOSIS — E785 Hyperlipidemia, unspecified: Secondary | ICD-10-CM

## 2024-10-14 DIAGNOSIS — F3281 Premenstrual dysphoric disorder: Secondary | ICD-10-CM

## 2024-10-14 DIAGNOSIS — E871 Hypo-osmolality and hyponatremia: Secondary | ICD-10-CM

## 2024-10-14 LAB — RENAL FUNCTION PANEL
Albumin: 3.1 g/dL — ABNORMAL LOW (ref 3.5–5.0)
Anion gap: 12 (ref 5–15)
BUN: 25 mg/dL — ABNORMAL HIGH (ref 8–23)
CO2: 24 mmol/L (ref 22–32)
Calcium: 8.2 mg/dL — ABNORMAL LOW (ref 8.9–10.3)
Chloride: 96 mmol/L — ABNORMAL LOW (ref 98–111)
Creatinine, Ser: 1.14 mg/dL — ABNORMAL HIGH (ref 0.44–1.00)
GFR, Estimated: 47 mL/min — ABNORMAL LOW (ref >60)
Glucose, Bld: 109 mg/dL — ABNORMAL HIGH (ref 70–99)
Phosphorus: 4.9 mg/dL — ABNORMAL HIGH (ref 2.5–4.6)
Potassium: 3.7 mmol/L (ref 3.5–5.1)
Sodium: 132 mmol/L — ABNORMAL LOW (ref 135–145)

## 2024-10-14 LAB — MAGNESIUM: Magnesium: 2 mg/dL (ref 1.7–2.4)

## 2024-10-14 MED ORDER — FUROSEMIDE 40 MG PO TABS
40.0000 mg | ORAL_TABLET | Freq: Every day | ORAL | 0 refills | Status: DC
  Filled 2024-10-14: qty 30, 30d supply, fill #0

## 2024-10-14 MED ORDER — OLMESARTAN MEDOXOMIL 20 MG PO TABS: 10.0000 mg | ORAL_TABLET | Freq: Every day | ORAL | Status: DC

## 2024-10-14 MED ORDER — FUROSEMIDE 40 MG PO TABS: 40.0000 mg | ORAL_TABLET | Freq: Every day | ORAL | Status: DC

## 2024-10-14 NOTE — Progress Notes (Signed)
 Mobility Specialist Progress Note:    10/14/24 0940  Mobility  Activity Ambulated independently  Level of Assistance Independent  Assistive Device None  Distance Ambulated (ft) 500 ft  Activity Response Tolerated well  Mobility Referral Yes  Mobility visit 1 Mobility  Mobility Specialist Start Time (ACUTE ONLY) 0940  Mobility Specialist Stop Time (ACUTE ONLY) 0950  Mobility Specialist Time Calculation (min) (ACUTE ONLY) 10 min   Received pt ambulating agreeable to session. No c/o any symptoms. Pt moving and ambulating well. Returned pt to room w/ all needs met.   Venetia Keel Mobility Specialist Please Neurosurgeon or Rehab Office at [phone]

## 2024-10-14 NOTE — Progress Notes (Signed)
 Progress Note  Patient Name: Rhonda [name] Date of Encounter: 10/14/2024  Primary Cardiologist: Lonni LITTIE Nanas, MD   Subjective   Dyspnea back to baseline. Feels well.   Inpatient Medications    Scheduled Meds:  enoxaparin (LOVENOX) injection  40 mg Subcutaneous Q24H   furosemide  20 mg Oral Daily   sodium chloride  flush  3 mL Intravenous Q12H   Continuous Infusions:  PRN Meds: ondansetron  **OR** ondansetron  (ZOFRAN ) IV, sodium chloride  flush   Vital Signs    Vitals:   10/14/24 0413 10/14/24 0414 10/14/24 0529 10/14/24 0822  BP:  (!) 120/51  113/67  Pulse:  65  97  Resp:  17  19  Temp: (!) 97.5 F (36.4 C) (!) 97.5 F (36.4 C)  97.6 F (36.4 C)  TempSrc:  Oral  Oral  SpO2:  96%  95%  Weight:   77.6 kg   Height:        Intake/Output Summary (Last 24 hours) at 10/14/2024 1157 Last data filed at 10/14/2024 0900 Gross per 24 hour  Intake 600 ml  Output 600 ml  Net 0 ml   Filed Weights   10/12/24 0011 10/13/24 0541 10/14/24 0529  Weight: 78.7 kg 76.4 kg 77.6 kg    Telemetry    Nsr with PAC's - Personally Reviewed  ECG    none - Personally Reviewed  Physical Exam   GEN: No acute distress.   Neck: No JVD Cardiac: RRR, soft AS murmur, rubs, or gallops. Decreased S2. Respiratory: Clear to auscultation bilaterally. GI: Soft, nontender, non-distended  MS: No edema; No deformity. Neuro:  Nonfocal  Psych: Normal affect   Labs    Chemistry Recent Labs  Lab 10/11/24 0615 10/12/24 0245 10/12/24 1504 10/12/24 1505 10/13/24 0229 10/14/24 0249  NA 131* 133*   < > 131* 135 132*  K 4.6 4.3   < > 4.2 3.9 3.7  CL 99 98  --   --  98 96*  CO2 20* 25  --   --  25 24  GLUCOSE 206* 105*  --   --  99 109*  BUN 19 18  --   --  19 25*  CREATININE 1.08* 1.03*  --   --  1.11* 1.14*  CALCIUM 8.3* 8.4*  --   --  8.8* 8.2*  PROT 6.4* 5.9*  --   --   --   --   ALBUMIN  3.4* 3.1*  --   --   --  3.1*  AST 41 25  --   --   --   --   ALT 30 23  --   --    --   --   ALKPHOS 72 58  --   --   --   --   BILITOT 0.7 0.5  --   --   --   --   GFRNONAA 51* 54*  --   --  49* 47*  ANIONGAP 12 10  --   --  12 12   < > = values in this interval not displayed.     Hematology Recent Labs  Lab 10/11/24 0615 10/12/24 0245 10/12/24 1504 10/12/24 1505 10/13/24 0229  WBC 19.1* 13.9*  --   --  10.9*  RBC 3.12* 2.88*  --   --  3.04*  HGB 9.9* 9.1* 9.2* 9.2* 9.6*  HCT 30.8* 27.6* 27.0* 27.0* 28.5*  MCV 98.7 95.8  --   --  93.8  MCH 31.7 31.6  --   --  31.6  MCHC 32.1 33.0  --   --  33.7  RDW 17.2* 17.2*  --   --  17.4*  PLT 389 275  --   --  297    Cardiac EnzymesNo results for input(s): TROPONINI in the last 168 hours. No results for input(s): TROPIPOC in the last 168 hours.   BNP Recent Labs  Lab 10/11/24 0615  BNP 201.5*     DDimer  Recent Labs  Lab 10/11/24 0952  DDIMER 1.32*     Radiology    CARDIAC CATHETERIZATION Result Date: 10/12/2024   Prox RCA to Mid RCA lesion is 30% stenosed.   Ost Cx to Prox Cx lesion is 30% stenosed.   Prox LAD to Mid LAD lesion is 30% stenosed. Mild non-obstructive CAD Mild elevation right heart pressures Continue workup for TAVR. Medical management of CAD.    Cardiac Studies   See above  Patient Profile     83 y.o. female admitted with sob and found to have severe AS.   Assessment & Plan    Acute on chronic diastolic heart failure - she appears euvolemic and back on oral lasix. DC home on lasix 40 (not 20) mg daily.  Severe AS - she will followup in our structural heart clinic to facilitate TAVR. Disp - ok to discharge today.    For questions or updates, please contact [facility] Please consult [email or web address] for contact info under Cardiology/STEMI.      Signed, Danelle Birmingham, MD  10/14/2024, 11:57 AM

## 2024-10-14 NOTE — Assessment & Plan Note (Signed)
 Continue with levothyroxine

## 2024-10-14 NOTE — Assessment & Plan Note (Signed)
Continue with simvastatin 

## 2024-10-14 NOTE — Discharge Summary (Signed)
 Physician Discharge Summary   Patient: Rhonda [name] MRN: [id number] DOB: 1941-01-31  Admit date:     10/11/2024  Discharge date: 10/14/24  Discharge Physician: Rhonda [name]   PCP: Varadarajan, Rupashree, MD   Recommendations at discharge:   Patient has been placed on furosemide 40 mg po daily for diuresis, decreased olmesartan to 10 mg daily to avoid hypotension. Guideline directed medical therapy limited due to risk of hypotension.  Follow up renal function and electrolytes as outpatient in 7 days Follow up with Rhonda [name] in 7 to 10 days as outpatient Follow up with Cardiology as scheduled for TAVR   Discharge Diagnoses: Principal Problem:   Acute on chronic diastolic CHF (congestive heart failure) (HCC) Active Problems:   HTN (hypertension)   Hypothyroidism   Rheumatoid arthritis (HCC)   Essential thrombocytosis (HCC)   Hyperglycemia   Hyponatremia   Obesity, class 1   Severe aortic stenosis  Resolved Problems:   * No resolved hospital problems. [facility] Course: Rhonda [name] was admitted to the hospital with the working diagnosis of heart failure exacerbation.   83 yo female with the past medical history of rheumatoid arthritis, hypertension, thrombocytosis, who presented with dyspnea. Reported worsening symptoms for the last 2 to 3 days prior to admission, associated with orthopnea and lower extremity edema. The night prior to admission had a severe episode of PND, EMS was called and she was found in respiratory distress with a 02 saturation of 70% on room air. She was placed on Cpap and transported to the ED.  On her initial physical examination her blood pressure was 118/67, HR 95, RR 2 and 02 saturation 100% on supplemental 02 per [place]  Lungs with no wheezing or rhonchi, heart with S1 and S2 present and regular with no gallops or murmurs, abdomen with no distention and positive lower extremity edema.   Na 131, K 4.6 Cl 99 bicarbonate 20, glucose 206  bun 19 cr 1,0  AST 41 ALT 30  BNP 201 High sensitive troponin 9 and 26  Wbc 19,1 hgb 9.9 plt 389   Chest radiograph with cardiomegaly, bilateral hilar vascular congestion, bilateral central interstitial infiltrates.   EKG 102 bpm, normal axis, normal intervals, qtc 473, sinus rhythm with 1st degree AV block, no significant ST segment or  T wave changes, positive PVC and PAC   Patient was placed on furosemide for diuresis Echocardiogram with reduced LV systolic function and severe aortic stenosis Cardiac catheterization with no significant CAD, positive elevated filling pressures  10/18 patient clinically stable, will follow up as outpatient for possible TAVR.   Assessment and Plan: * Acute on chronic diastolic CHF (congestive heart failure) (HCC) Echocardiogram with preserved LV systolic function with EF 50 to 55%, mild dilatated LV cavity, mild LVH, mild left LVH, basal septal segment. RV systolic function preserved, mild mitral valve regurgitation, severe aortic valve stenosis.  Hypokinesis of the distal septum and apex.   10/16 cardiac catheterization  Mild non obstructive coronary artery disease RA 12/9 RV 37/8 PA mean 28  PCWP 20  Cardiac output 4.9 and index 2.7 (Fick)   Patient was placed on IV furosemide for diuresis, negative fluid balance was achieved, with improvement in her symptoms.   Decrease olmesartan to 20 mg to prevent hypotension, hold on metoprolol and amlodipine .  Continue diuresis with furosemide 40 mg po daily.   Patient will follow up as outpatient for TAVR.   Acute hypoxemic respiratory failure due to acute cardiogenic pulmonary edema Clinically  resolved   HTN (hypertension) Will resume blood pressure control with low dose losartan Close follow up as outpatient.   Hypothyroidism Continue levothyroxine    Rheumatoid arthritis (HCC) Appears stable  On plaquenil  and methotrexate   Essential thrombocytosis (HCC) Cell count has been stable Follow  up with hematology as outpatient as scheduled Continue with anagrelide  and folic acid    Hyperglycemia Patient ruled out for diabetes mellitus  Hgb A1c 5.6   Hyponatremia At the time of discharge renal function with serum cr at 1,0 with K at 4,6 and serum bicarbonate at 20  Na 131   Plan to continue diuresis with furosemide, because K more than 4, will hold on K supplements for now. Follow up renal function as outpatient.   Hypothyroid Continue with levothyroxine    Depression Continue with paroxetine    Dyslipidemia Continue with simvastatin    Obesity, class 1 Calculated BMI is 31.7      Consultants: cardiology  Procedures performed: cardiac catheterization  Disposition: Home Diet recommendation:  Cardiac diet DISCHARGE MEDICATION: Allergies as of 10/14/2024       Reactions   Amlodipine  Other (See Comments)   Atorvastatin Other (See Comments)   Lisinopril Other (See Comments)   Meloxicam Other (See Comments)   Other Reaction(s): stomach upset, hives   Aleve [naproxen] Other (See Comments)   GI upset        Medication List     STOP taking these medications    amLODipine  5 MG tablet Commonly known as: NORVASC        TAKE these medications    acetaminophen  650 MG CR tablet Commonly known as: TYLENOL  Take 1,300 mg by mouth every 8 (eight) hours as needed for pain.   alendronate 70 MG tablet Commonly known as: FOSAMAX Take 70 mg by mouth once a week.   anagrelide  0.5 MG capsule Commonly known as: AGRYLIN Take 1 capsule (0.5 mg total) by mouth 2 (two) times daily.   CALCIUM-MAGNESIUM-ZINC-D3 PO Take 1 tablet by mouth 2 (two) times daily.   folic acid  800 MCG tablet Commonly known as: FOLVITE  Take 800 mcg by mouth daily.   furosemide 40 MG tablet Commonly known as: LASIX Take 1 tablet (40 mg total) by mouth daily. Start taking on: October 15, 2024   hydroxychloroquine  200 MG tablet Commonly known as: PLAQUENIL  TAKE 1 TABLET BY MOUTH TWICE   DAILY   levothyroxine  25 MCG tablet Commonly known as: SYNTHROID  Take 25 mcg by mouth daily before breakfast.   methotrexate  2.5 MG tablet Commonly known as: RHEUMATREX Take 12.5 mg by mouth every Friday.   multivitamin with minerals Tabs tablet Take 1 tablet by mouth daily.   olmesartan 20 MG tablet Commonly known as: BENICAR Take 0.5 tablets (10 mg total) by mouth daily. What changed: how much to take   omeprazole 40 MG capsule Commonly known as: PRILOSEC Take 40 mg by mouth daily.   PARoxetine  20 MG tablet Commonly known as: PAXIL  Take 20 mg by mouth daily.   polyethylene glycol 17 g packet Commonly known as: MIRALAX / GLYCOLAX Take 17 g by mouth daily.   PROBIOTIC DAILY PO Take 1 capsule by mouth daily.   Refresh 1.4-0.6 % Soln Generic drug: Polyvinyl Alcohol -Povidone PF Place 1 drop into both eyes daily.   Refresh Liquigel 1 % Gel Generic drug: Carboxymethylcellulose Sodium Place 1 drop into both eyes at bedtime.   simvastatin  40 MG tablet Commonly known as: ZOCOR  Take 40 mg by mouth at bedtime.  Follow-up Information     [name] Charm, MD Follow up.   Specialty: Internal Medicine Contact information: [address] [place] [address] [phone]                Discharge Exam: Fredricka Weights   10/12/24 0011 10/13/24 0541 10/14/24 0529  Weight: 78.7 kg 76.4 kg 77.6 kg   BP (!) 109/43 (BP Location: Left Arm)   Pulse (!) 50   Temp 97.6 F (36.4 C) (Oral)   Resp 19   Ht 5' 2 (1.575 m) Comment: per patient statement  Wt 77.6 kg   SpO2 94%   BMI 31.29 kg/m   Neurology awake and alert ENT with mild pallor with no icterus Cardiovascular with S1 and S2 present and regular with no gallops or rubs, positive systolic murmur at the base, radiated to the carotids Respiratory with no rales or wheezing, no rhonchi  Abdomen with no distention, soft and non tender  No lower extremity edema   Condition at  discharge: stable  The results of significant diagnostics from this hospitalization (including imaging, microbiology, ancillary and laboratory) are listed below for reference.   Imaging Studies: MM 3D SCREENING MAMMOGRAM BILATERAL BREAST Result Date: 10/13/2024 CLINICAL DATA:  Screening. EXAM: DIGITAL SCREENING BILATERAL MAMMOGRAM WITH TOMOSYNTHESIS AND CAD TECHNIQUE: Bilateral screening digital craniocaudal and mediolateral oblique mammograms were obtained. Bilateral screening digital breast tomosynthesis was performed. The images were evaluated with computer-aided detection. COMPARISON:  Previous exam(s). ACR Breast Density Category b: There are scattered areas of fibroglandular density. FINDINGS: There are no findings suspicious for malignancy. IMPRESSION: No mammographic evidence of malignancy. A result letter of this screening mammogram will be mailed directly to the patient. RECOMMENDATION: Screening mammogram in one year. (Code:[id number]-B-01Y) BI-RADS CATEGORY  1: Negative. Electronically Signed   By: Alm Parkins M.D.   On: 10/13/2024 08:15   CARDIAC CATHETERIZATION Result Date: 10/12/2024   Prox RCA to Mid RCA lesion is 30% stenosed.   Ost Cx to Prox Cx lesion is 30% stenosed.   Prox LAD to Mid LAD lesion is 30% stenosed. Mild non-obstructive CAD Mild elevation right heart pressures Continue workup for TAVR. Medical management of CAD.   ECHOCARDIOGRAM COMPLETE Result Date: 10/11/2024    ECHOCARDIOGRAM REPORT   Patient Name:   Cylee JONELLE ALDERTON Date of Exam: 10/11/2024 Medical Rec #:  [id number]      Height:       63.0 in Accession #:    [id number]     Weight:       183.0 lb Date of Birth:  08-17-1941      BSA:          1.862 m Patient Age:    83 years       BP:           101/90 mmHg Patient Gender: F              HR:           99 bpm. Exam Location:  Inpatient Procedure: 2D Echo, Cardiac Doppler, Color Doppler and Intracardiac            Opacification Agent (Both Spectral and Color Flow Doppler were             utilized during procedure). Indications:    CHF I50.9  History:        Patient has no prior history of Echocardiogram examinations.  CHF, Signs/Symptoms:Murmur and Dyspnea; Risk                 Factors:Hypertension.  Sonographer:    BERNARDA ROCKS Referring Phys: [place].Bigness STEVEN J NEWTON IMPRESSIONS  1. Hypokinesis of the distal septum and apex with overall low normal LV function.  2. Left ventricular ejection fraction, by estimation, is 50 to 55%. The left ventricle has low normal function. The left ventricle demonstrates regional wall motion abnormalities (see scoring diagram/findings for description). The left ventricular internal cavity size was mildly dilated. There is mild left ventricular hypertrophy of the basal-septal segment. Left ventricular diastolic parameters are indeterminate.  3. Right ventricular systolic function is normal. The right ventricular size is normal. There is mildly elevated pulmonary artery systolic pressure.  4. Left atrial size was severely dilated.  5. The mitral valve is normal in structure. Mild mitral valve regurgitation. No evidence of mitral stenosis. Moderate mitral annular calcification.  6. The aortic valve is calcified. Aortic valve regurgitation is trivial. Severe aortic valve stenosis.  7. The inferior vena cava is normal in size with <50% respiratory variability, suggesting right atrial pressure of 8 mmHg. Comparison(s): No prior Echocardiogram. FINDINGS  Left Ventricle: Left ventricular ejection fraction, by estimation, is 50 to 55%. The left ventricle has low normal function. The left ventricle demonstrates regional wall motion abnormalities. Definity contrast agent was given IV to delineate the left ventricular endocardial borders. The left ventricular internal cavity size was mildly dilated. There is mild left ventricular hypertrophy of the basal-septal segment. Left ventricular diastolic parameters are indeterminate. Right Ventricle: The right  ventricular size is normal. Right ventricular systolic function is normal. There is mildly elevated pulmonary artery systolic pressure. The tricuspid regurgitant velocity is 3.05 m/s, and with an assumed right atrial pressure of 3 mmHg, the estimated right ventricular systolic pressure is 40.2 mmHg. Left Atrium: Left atrial size was severely dilated. Right Atrium: Right atrial size was normal in size. Pericardium: There is no evidence of pericardial effusion. Mitral Valve: The mitral valve is normal in structure. Moderate mitral annular calcification. Mild mitral valve regurgitation. No evidence of mitral valve stenosis. MV peak gradient, 11.4 mmHg. The mean mitral valve gradient is 4.0 mmHg. Tricuspid Valve: The tricuspid valve is normal in structure. Tricuspid valve regurgitation is trivial. No evidence of tricuspid stenosis. Aortic Valve: The aortic valve is calcified. Aortic valve regurgitation is trivial. Aortic regurgitation PHT measures 243 msec. Severe aortic stenosis is present. Aortic valve mean gradient measures 48.0 mmHg. Aortic valve peak gradient measures 81.0 mmHg. Aortic valve area, by VTI measures 0.44 cm. Pulmonic Valve: The pulmonic valve was normal in structure. Pulmonic valve regurgitation is not visualized. No evidence of pulmonic stenosis. Aorta: The aortic root is normal in size and structure. Venous: The inferior vena cava is normal in size with less than 50% respiratory variability, suggesting right atrial pressure of 8 mmHg. IAS/Shunts: No atrial level shunt detected by color flow Doppler. Additional Comments: Hypokinesis of the distal septum and apex with overall low normal LV function.  LEFT VENTRICLE PLAX 2D LVIDd:         5.90 cm      Diastology LVIDs:         3.90 cm      LV e' medial:    2.50 cm/s LV PW:         0.80 cm      LV E/e' medial:  46.0 LV IVS:        0.90 cm  LV e' lateral:   6.85 cm/s LVOT diam:     2.00 cm      LV E/e' lateral: 16.8 LV SV:         50 LV SV Index:    27 LVOT Area:     3.14 cm  LV Volumes (MOD) LV vol d, MOD A2C: 179.0 ml LV vol d, MOD A4C: 180.0 ml LV vol s, MOD A2C: 83.1 ml LV vol s, MOD A4C: 81.2 ml LV SV MOD A2C:     95.9 ml LV SV MOD A4C:     180.0 ml LV SV MOD BP:      97.5 ml RIGHT VENTRICLE             IVC RV Basal diam:  3.20 cm     IVC diam: 1.70 cm RV S prime:     17.90 cm/s TAPSE (M-mode): 2.0 cm RVSP:           45.2 mmHg LEFT ATRIUM             Index        RIGHT ATRIUM           Index LA diam:        4.40 cm 2.36 cm/m   RA Pressure: 8.00 mmHg LA Vol (A2C):   98.5 ml 52.90 ml/m  RA Area:     10.40 cm LA Vol (A4C):   85.8 ml 46.08 ml/m  RA Volume:   22.80 ml  12.25 ml/m LA Biplane Vol: 99.2 ml 53.28 ml/m  AORTIC VALVE                     PULMONIC VALVE AV Area (Vmax):    0.51 cm      PV Vmax:          1.02 m/s AV Area (Vmean):   0.42 cm      PV Peak grad:     4.2 mmHg AV Area (VTI):     0.44 cm      PR End Diast Vel: 1.08 msec AV Vmax:           450.00 cm/s AV Vmean:          331.000 cm/s AV VTI:            1.140 m AV Peak Grad:      81.0 mmHg AV Mean Grad:      48.0 mmHg LVOT Vmax:         72.70 cm/s LVOT Vmean:        44.400 cm/s LVOT VTI:          0.158 m LVOT/AV VTI ratio: 0.14 AI PHT:            243 msec  AORTA Ao Root diam: 2.80 cm Ao Asc diam:  3.10 cm MITRAL VALVE                TRICUSPID VALVE MV Area (PHT): 5.42 cm     TR Peak grad:   37.2 mmHg MV Area VTI:   2.48 cm     TR Vmax:        305.00 cm/s MV Peak grad:  11.4 mmHg    Estimated RAP:  8.00 mmHg MV Mean grad:  4.0 mmHg     RVSP:           45.2 mmHg MV Vmax:       1.69 m/s MV Vmean:      90.3  cm/s    SHUNTS MV Decel Time: 140 msec     Systemic VTI:  0.16 m MR Peak grad: 146.4 mmHg    Systemic Diam: 2.00 cm MR Mean grad: 89.0 mmHg MR Vmax:      605.00 cm/s MR Vmean:     438.0 cm/s MV E velocity: 115.00 cm/s MV A velocity: 103.00 cm/s MV E/A ratio:  1.12 Redell Shallow MD Electronically signed by Redell Shallow MD Signature Date/Time: 10/11/2024/4:06:58 PM    Final    DG  Chest Port 1 View Result Date: 10/11/2024 EXAM: 1 VIEW(S) XRAY OF THE CHEST 10/11/2024 06:36:14 AM COMPARISON: PA and lateral radiographs of the chest dated 03/10/2023. CLINICAL HISTORY: sob. Sob; rover. FINDINGS: LUNGS AND PLEURA: Diffuse interstitial prominence. Possible trace right pleural effusion. No focal pulmonary opacity. No pulmonary edema. No pneumothorax. HEART AND MEDIASTINUM: Heart size is at the upper limits of normal. Aortic atherosclerosis. No other acute abnormality of the cardiac and mediastinal silhouettes. BONES AND SOFT TISSUES: No acute osseous abnormality. IMPRESSION: 1. Diffuse interstitial prominence/edema and possible trace right pleural effusion. 2. Heart size at upper limits of normal. Aortic atherosclerosis. Electronically signed by: Evalene Coho MD 10/11/2024 07:00 AM EDT RP Workstation: [id number]    Microbiology: Results for orders placed or performed during the hospital encounter of 09/13/20  SARS Coronavirus 2 by RT PCR (hospital order, performed in [facility] hospital lab) Nasopharyngeal Nasopharyngeal Swab     Status: None   Collection Time: 09/13/20 10:33 PM   Specimen: Nasopharyngeal Swab  Result Value Ref Range Status   SARS Coronavirus 2 NEGATIVE NEGATIVE Final    Comment: (NOTE) SARS-CoV-2 target nucleic acids are NOT DETECTED.  The SARS-CoV-2 RNA is generally detectable in upper and lower respiratory specimens during the acute phase of infection. The lowest concentration of SARS-CoV-2 viral copies this assay can detect is 250 copies / mL. A negative result does not preclude SARS-CoV-2 infection and should not be used as the sole basis for treatment or other patient management decisions.  A negative result may occur with improper specimen collection / handling, submission of specimen other than nasopharyngeal swab, presence of viral mutation(s) within the areas targeted by this assay, and inadequate number of viral copies (<250 copies / mL). A  negative result must be combined with clinical observations, patient history, and epidemiological information.  Fact Sheet for Patients:   BoilerBrush.com.cy  Fact Sheet for Healthcare Providers: https://pope.com/  This test is not yet approved or  cleared by the [place]  FDA and has been authorized for detection and/or diagnosis of SARS-CoV-2 by FDA under an Emergency Use Authorization (EUA).  This EUA will remain in effect (meaning this test can be used) for the duration of the COVID-19 declaration under Section 564(b)(1) of the Act, 21 U.S.C. section 360bbb-3(b)(1), unless the authorization is terminated or revoked sooner.  Performed at [facility], [address] [place], [place] [address]   MRSA PCR Screening     Status: None   Collection Time: 09/14/20  5:04 AM   Specimen: Nasal Mucosa; Nasopharyngeal  Result Value Ref Range Status   MRSA by PCR NEGATIVE NEGATIVE Final    Comment:        The GeneXpert MRSA Assay (FDA approved for NASAL specimens only), is one component of a comprehensive MRSA colonization surveillance program. It is not intended to diagnose MRSA infection nor to guide or monitor treatment for MRSA infections. Performed at [facility], [address] [place],   [address]     Labs: CBC: Recent Labs  Lab 10/11/24 0615 10/12/24 0245 10/12/24 1504 10/12/24 1505 10/13/24 0229  WBC 19.1* 13.9*  --   --  10.9*  HGB 9.9* 9.1* 9.2* 9.2* 9.6*  HCT 30.8* 27.6* 27.0* 27.0* 28.5*  MCV 98.7 95.8  --   --  93.8  PLT 389 275  --   --  297   Basic Metabolic Panel: Recent Labs  Lab 10/11/24 0615 10/12/24 0245 10/12/24 1504 10/12/24 1505 10/13/24 0229 10/14/24 0249  NA 131* 133* 127* 131* 135 132*  K 4.6 4.3 3.6 4.2 3.9 3.7  CL 99 98  --   --  98 96*  CO2 20* 25  --   --  25 24  GLUCOSE 206* 105*  --   --  99 109*  BUN 19 18  --   --  19 25*  CREATININE 1.08* 1.03*  --    --  1.11* 1.14*  CALCIUM 8.3* 8.4*  --   --  8.8* 8.2*  MG  --  1.9  --   --  1.8 2.0  PHOS  --   --   --   --   --  4.9*   Liver Function Tests: Recent Labs  Lab 10/11/24 0615 10/12/24 0245 10/14/24 0249  AST 41 25  --   ALT 30 23  --   ALKPHOS 72 58  --   BILITOT 0.7 0.5  --   PROT 6.4* 5.9*  --   ALBUMIN  3.4* 3.1* 3.1*   CBG: Recent Labs  Lab 10/12/24 0602 10/12/24 1142 10/12/24 1650 10/12/24 2125 10/13/24 0610  GLUCAP 112* 107* 94 107* 104*    Discharge time spent: greater than 30 minutes.  Signed: Elidia Toribio Furnace, MD [facility] 10/14/2024

## 2024-10-14 NOTE — Assessment & Plan Note (Signed)
Continue with paroxetine.

## 2024-10-14 NOTE — Plan of Care (Signed)
  Problem: Education: Goal: Ability to describe self-care measures that may prevent or decrease complications (Diabetes Survival Skills Education) will improve Outcome: Progressing   Problem: Coping: Goal: Ability to adjust to condition or change in health will improve Outcome: Progressing   Problem: Fluid Volume: Goal: Ability to maintain a balanced intake and output will improve Outcome: Progressing   Problem: Health Behavior/Discharge Planning: Goal: Ability to manage health-related needs will improve Outcome: Progressing   Problem: Skin Integrity: Goal: Risk for impaired skin integrity will decrease Outcome: Progressing   

## 2024-10-15 DIAGNOSIS — I5033 Acute on chronic diastolic (congestive) heart failure: Secondary | ICD-10-CM | POA: Diagnosis not present

## 2024-10-16 ENCOUNTER — Telehealth: Payer: Self-pay | Admitting: *Deleted

## 2024-10-16 NOTE — Telephone Encounter (Signed)
 Received vm message from pt requesting a call back regarding her anagrelide . TCT patient and spoke with her.  She states a doctor told her (while she was in the hospital recently) that her anagrelide  caused her heart issues,  Discussed with Dr. Federico. He states medical literature does not endorse that. Advised that Dr. Federico wants her to continue her anagrelide . Pt voiced understanding and is very agreeable to continuing this medication.

## 2024-10-17 DIAGNOSIS — Z79899 Other long term (current) drug therapy: Secondary | ICD-10-CM | POA: Diagnosis not present

## 2024-10-17 DIAGNOSIS — I10 Essential (primary) hypertension: Secondary | ICD-10-CM | POA: Diagnosis not present

## 2024-10-17 DIAGNOSIS — D72829 Elevated white blood cell count, unspecified: Secondary | ICD-10-CM | POA: Diagnosis not present

## 2024-10-17 DIAGNOSIS — E785 Hyperlipidemia, unspecified: Secondary | ICD-10-CM | POA: Diagnosis not present

## 2024-10-17 DIAGNOSIS — I509 Heart failure, unspecified: Secondary | ICD-10-CM | POA: Diagnosis not present

## 2024-10-17 DIAGNOSIS — I5033 Acute on chronic diastolic (congestive) heart failure: Secondary | ICD-10-CM | POA: Diagnosis not present

## 2024-10-20 ENCOUNTER — Other Ambulatory Visit: Payer: Self-pay

## 2024-10-23 ENCOUNTER — Inpatient Hospital Stay (HOSPITAL_BASED_OUTPATIENT_CLINIC_OR_DEPARTMENT_OTHER)
Admit: 2024-10-23 | Discharge: 2024-10-23 | Disposition: A | Discharge status: Home / Self Care | Attending: Cardiology | Admitting: Cardiology

## 2024-10-23 DIAGNOSIS — I350 Nonrheumatic aortic (valve) stenosis: Secondary | ICD-10-CM | POA: Diagnosis not present

## 2024-10-23 MED ORDER — IOHEXOL 350 MG/ML SOLN
100.0000 mL | Freq: Once | INTRAVENOUS | Status: AC | PRN
  Administered 2024-10-23: 100 mL via INTRAVENOUS

## 2024-10-23 NOTE — Progress Notes (Deleted)
 [address]       [place] [address]             [phone]        CHALENE TREU [facility] Medical Record #[id number] Date of Birth: 06/01/1941  Referring: Elliot Charm,* Primary Care: Elliot Charm, MD Primary Cardiologist:Christopher LITTIE Nanas, MD  Chief Complaint:   No chief complaint on file.   History of Present Illness:     Rhonda [name] is a 83 y.o. female presents for surgical evaluation of ***  Aortic stenosis: Presented with shortness of breath, echo showed severe aortic stenosis.  On review of echo, AS appears critical. LHC/RHC showed nonobstructive CAD with mild elevation in filling pressures (RA 9 RV 37/8 PA 39/20 PCWP 20 CI 2.8).   -Discussed with structural heart team: planning exepedited TAVR eval, will arrange outpatient TAVR Cts and close f/u   Acute on chronic diastolic heart failure: Presented with volume overload.  Echo shows EF 50 to 55%, normal RV function, severe left atrial enlargement, severe aortic stenosis, RAP 8.  Mild elevation in filling pressures as above -Currently appears euvolemic, will switch to PO lasix.   Hypertension: On olmesartan 20 mg daily and amlodipine  5 mg daily prior to admission.  BP soft, currently holding antihypertensives   Disposition: will switch to PO lasix today, if maintaining euvolemia on PO lasix can plan for discharge tomorrow, will have close f/u with structural heart team    Past Medical History:  Diagnosis Date   Anxiety    pt. may have panicy feeling upon awaking from anesth. - states she feels anxious at times  & has used Paxil  & xanax in the [past but its been a very long time ago    Arthritis    rheumatoid & OA-hands, shoulders    Dyspnea    GERD (gastroesophageal reflux disease)    Heart murmur    Hypertension     Past Surgical History:  Procedure Laterality Date   APPENDECTOMY     laproscopic   BREAST BIOPSY Left 09/10/2023   MM LT BREAST BX W LOC DEV  1ST LESION IMAGE BX SPEC STEREO GUIDE 09/10/2023 [facility]   BREAST BIOPSY Left 09/10/2023   MM LT BREAST BX W LOC DEV EA AD LESION IMG BX SPEC STEREO GUIDE 09/10/2023 [facility]   BREAST EXCISIONAL BIOPSY Left    COLONOSCOPY     HEMORROIDECTOMY     INSERTION OF MESH N/A 09/12/2018   Procedure: INSERTION OF MESH;  Surgeon: Curvin Deward MOULD, MD;  Location: [facility] OR;  Service: General;  Laterality: N/A;   LAPAROSCOPIC APPENDECTOMY N/A 01/06/2017   Procedure: LAPAROSCOPIC APPENDECTOMY;  Surgeon: Deward Curvin III, MD;  Location: [facility] OR;  Service: General;  Laterality: N/A;   RIGHT HEART CATH AND CORONARY ANGIOGRAPHY N/A 10/12/2024   Procedure: RIGHT HEART CATH AND CORONARY ANGIOGRAPHY;  Surgeon: Verlin Lonni BIRCH, MD;  Location: [facility] INVASIVE CV LAB;  Service: Cardiovascular;  Laterality: N/A;   TONSILLECTOMY     as a child   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   Procedure: HIP ARTHROPLASTY ANTERIOR APPROACH;  Surgeon: Addie Cordella Hamilton, MD;  Location: [facility] OR;  Service: Orthopedics;  Laterality: Left;   VAGINAL DELIVERY     x2   VENTRAL HERNIA REPAIR N/A 09/12/2018   Procedure: LAPAROSCOPIC VENTRAL HERNIA REPAIR WITH MESH;  Surgeon: Curvin Deward MOULD, MD;  Location: [facility] OR;  Service: General;  Laterality: N/A;   VIDEO BRONCHOSCOPY  Bilateral 11/07/2019   Procedure: VIDEO BRONCHOSCOPY WITH FLUORO;  Surgeon: Shellia Oh, MD;  Location: [facility] ENDOSCOPY;  Service: Endoscopy;  Laterality: Bilateral;    Social History:  Social History   Tobacco Use  Smoking Status Never  Smokeless Tobacco Never    Social History   Substance and Sexual Activity  Alcohol  Use Yes   Comment: daily- wine & bourbon & ginger      Allergies  Allergen Reactions   Amlodipine  Other (See Comments)   Atorvastatin Other (See Comments)   Lisinopril Other (See Comments)   Meloxicam Other (See Comments)    Other Reaction(s): stomach upset, hives   Aleve [Naproxen] Other (See Comments)    GI upset       Current Outpatient Medications  Medication Sig Dispense Refill   acetaminophen  (TYLENOL ) 650 MG CR tablet Take 1,300 mg by mouth every 8 (eight) hours as needed for pain.     alendronate (FOSAMAX) 70 MG tablet Take 70 mg by mouth once a week.     anagrelide  (AGRYLIN) 0.5 MG capsule Take 1 capsule (0.5 mg total) by mouth 2 (two) times daily. 60 capsule 3   Carboxymethylcellulose Sodium (REFRESH LIQUIGEL) 1 % GEL Place 1 drop into both eyes at bedtime.     folic acid  (FOLVITE ) 800 MCG tablet Take 800 mcg by mouth daily.      furosemide (LASIX) 40 MG tablet Take 1 tablet (40 mg total) by mouth daily. 30 tablet 0   hydroxychloroquine  (PLAQUENIL ) 200 MG tablet TAKE 1 TABLET BY MOUTH TWICE  DAILY 180 tablet 3   levothyroxine  (SYNTHROID , LEVOTHROID) 25 MCG tablet Take 25 mcg by mouth daily before breakfast.      methotrexate  (RHEUMATREX) 2.5 MG tablet Take 12.5 mg by mouth every Friday.      Multiple Minerals-Vitamins (CALCIUM-MAGNESIUM-ZINC-D3 PO) Take 1 tablet by mouth 2 (two) times daily.      Multiple Vitamin (MULTIVITAMIN WITH MINERALS) TABS tablet Take 1 tablet by mouth daily.     olmesartan (BENICAR) 20 MG tablet Take 0.5 tablets (10 mg total) by mouth daily.     omeprazole (PRILOSEC) 40 MG capsule Take 40 mg by mouth daily.     PARoxetine  (PAXIL ) 20 MG tablet Take 20 mg by mouth daily.     polyethylene glycol (MIRALAX / GLYCOLAX) packet Take 17 g by mouth daily.      Polyvinyl Alcohol -Povidone PF (REFRESH) 1.4-0.6 % SOLN Place 1 drop into both eyes daily.     Probiotic Product (PROBIOTIC DAILY PO) Take 1 capsule by mouth daily.     simvastatin  (ZOCOR ) 40 MG tablet Take 40 mg by mouth at bedtime.      No current facility-administered medications for this visit.    (Not in a hospital admission)   Family History  Problem Relation Age of Onset   Breast cancer Daughter    Breast cancer Maternal Grandfather      Review of Systems:   ROS    Physical Exam: There were no  vitals taken for this visit. Physical Exam    Cardiac Studies & Procedures   ______________________________________________________________________________________________ CARDIAC CATHETERIZATION  CARDIAC CATHETERIZATION 10/12/2024  Conclusion   Prox RCA to Mid RCA lesion is 30% stenosed.   Ost Cx to Prox Cx lesion is 30% stenosed.   Prox LAD to Mid LAD lesion is 30% stenosed.  Mild non-obstructive CAD Mild elevation right heart pressures  Continue workup for TAVR. Medical management of CAD.  Findings Coronary Findings Diagnostic  Dominance: Right  Left Anterior Descending Vessel is large. Prox LAD to Mid LAD lesion is 30% stenosed. The lesion is calcified.  Left Circumflex Vessel is large. Ost Cx to Prox Cx lesion is 30% stenosed. The lesion is calcified.  Right Coronary Artery Vessel is large. Prox RCA to Mid RCA lesion is 30% stenosed. The lesion is calcified.  Intervention  No interventions have been documented.     ECHOCARDIOGRAM  ECHOCARDIOGRAM COMPLETE 10/11/2024  Narrative ECHOCARDIOGRAM REPORT    Patient Name:   Tnya JONELLE ALDERTON Date of Exam: 10/11/2024 Medical Rec #:  [id number]      Height:       63.0 in Accession #:    [id number]     Weight:       183.0 lb Date of Birth:  1941/09/16      BSA:          1.862 m Patient Age:    83 years       BP:           101/90 mmHg Patient Gender: F              HR:           99 bpm. Exam Location:  Inpatient  Procedure: 2D Echo, Cardiac Doppler, Color Doppler and Intracardiac Opacification Agent (Both Spectral and Color Flow Doppler were utilized during procedure).  Indications:    CHF I50.9  History:        Patient has no prior history of Echocardiogram examinations. CHF, Signs/Symptoms:Murmur and Dyspnea; Risk Factors:Hypertension.  Sonographer:    BERNARDA ROCKS Referring Phys: AUGUSTA.BIGNESS STEVEN J NEWTON  IMPRESSIONS   1. Hypokinesis of the distal septum and apex with overall low normal LV function. 2.  Left ventricular ejection fraction, by estimation, is 50 to 55%. The left ventricle has low normal function. The left ventricle demonstrates regional wall motion abnormalities (see scoring diagram/findings for description). The left ventricular internal cavity size was mildly dilated. There is mild left ventricular hypertrophy of the basal-septal segment. Left ventricular diastolic parameters are indeterminate. 3. Right ventricular systolic function is normal. The right ventricular size is normal. There is mildly elevated pulmonary artery systolic pressure. 4. Left atrial size was severely dilated. 5. The mitral valve is normal in structure. Mild mitral valve regurgitation. No evidence of mitral stenosis. Moderate mitral annular calcification. 6. The aortic valve is calcified. Aortic valve regurgitation is trivial. Severe aortic valve stenosis. 7. The inferior vena cava is normal in size with <50% respiratory variability, suggesting right atrial pressure of 8 mmHg.  Comparison(s): No prior Echocardiogram.  FINDINGS Left Ventricle: Left ventricular ejection fraction, by estimation, is 50 to 55%. The left ventricle has low normal function. The left ventricle demonstrates regional wall motion abnormalities. Definity contrast agent was given IV to delineate the left ventricular endocardial borders. The left ventricular internal cavity size was mildly dilated. There is mild left ventricular hypertrophy of the basal-septal segment. Left ventricular diastolic parameters are indeterminate.  Right Ventricle: The right ventricular size is normal. Right ventricular systolic function is normal. There is mildly elevated pulmonary artery systolic pressure. The tricuspid regurgitant velocity is 3.05 m/s, and with an assumed right atrial pressure of 3 mmHg, the estimated right ventricular systolic pressure is 40.2 mmHg.  Left Atrium: Left atrial size was severely dilated.  Right Atrium: Right atrial size was  normal in size.  Pericardium: There is no evidence of pericardial effusion.  Mitral Valve: The mitral valve is normal in structure. Moderate mitral annular calcification.  Mild mitral valve regurgitation. No evidence of mitral valve stenosis. MV peak gradient, 11.4 mmHg. The mean mitral valve gradient is 4.0 mmHg.  Tricuspid Valve: The tricuspid valve is normal in structure. Tricuspid valve regurgitation is trivial. No evidence of tricuspid stenosis.  Aortic Valve: The aortic valve is calcified. Aortic valve regurgitation is trivial. Aortic regurgitation PHT measures 243 msec. Severe aortic stenosis is present. Aortic valve mean gradient measures 48.0 mmHg. Aortic valve peak gradient measures 81.0 mmHg. Aortic valve area, by VTI measures 0.44 cm.  Pulmonic Valve: The pulmonic valve was normal in structure. Pulmonic valve regurgitation is not visualized. No evidence of pulmonic stenosis.  Aorta: The aortic root is normal in size and structure.  Venous: The inferior vena cava is normal in size with less than 50% respiratory variability, suggesting right atrial pressure of 8 mmHg.  IAS/Shunts: No atrial level shunt detected by color flow Doppler.  Additional Comments: Hypokinesis of the distal septum and apex with overall low normal LV function.   LEFT VENTRICLE PLAX 2D LVIDd:         5.90 cm      Diastology LVIDs:         3.90 cm      LV e' medial:    2.50 cm/s LV PW:         0.80 cm      LV E/e' medial:  46.0 LV IVS:        0.90 cm      LV e' lateral:   6.85 cm/s LVOT diam:     2.00 cm      LV E/e' lateral: 16.8 LV SV:         50 LV SV Index:   27 LVOT Area:     3.14 cm  LV Volumes (MOD) LV vol d, MOD A2C: 179.0 ml LV vol d, MOD A4C: 180.0 ml LV vol s, MOD A2C: 83.1 ml LV vol s, MOD A4C: 81.2 ml LV SV MOD A2C:     95.9 ml LV SV MOD A4C:     180.0 ml LV SV MOD BP:      97.5 ml  RIGHT VENTRICLE             IVC RV Basal diam:  3.20 cm     IVC diam: 1.70 cm RV S prime:      17.90 cm/s TAPSE (M-mode): 2.0 cm RVSP:           45.2 mmHg  LEFT ATRIUM             Index        RIGHT ATRIUM           Index LA diam:        4.40 cm 2.36 cm/m   RA Pressure: 8.00 mmHg LA Vol (A2C):   98.5 ml 52.90 ml/m  RA Area:     10.40 cm LA Vol (A4C):   85.8 ml 46.08 ml/m  RA Volume:   22.80 ml  12.25 ml/m LA Biplane Vol: 99.2 ml 53.28 ml/m AORTIC VALVE                     PULMONIC VALVE AV Area (Vmax):    0.51 cm      PV Vmax:          1.02 m/s AV Area (Vmean):   0.42 cm      PV Peak grad:     4.2 mmHg AV Area (VTI):  0.44 cm      PR End Diast Vel: 1.08 msec AV Vmax:           450.00 cm/s AV Vmean:          331.000 cm/s AV VTI:            1.140 m AV Peak Grad:      81.0 mmHg AV Mean Grad:      48.0 mmHg LVOT Vmax:         72.70 cm/s LVOT Vmean:        44.400 cm/s LVOT VTI:          0.158 m LVOT/AV VTI ratio: 0.14 AI PHT:            243 msec  AORTA Ao Root diam: 2.80 cm Ao Asc diam:  3.10 cm  MITRAL VALVE                TRICUSPID VALVE MV Area (PHT): 5.42 cm     TR Peak grad:   37.2 mmHg MV Area VTI:   2.48 cm     TR Vmax:        305.00 cm/s MV Peak grad:  11.4 mmHg    Estimated RAP:  8.00 mmHg MV Mean grad:  4.0 mmHg     RVSP:           45.2 mmHg MV Vmax:       1.69 m/s MV Vmean:      90.3 cm/s    SHUNTS MV Decel Time: 140 msec     Systemic VTI:  0.16 m MR Peak grad: 146.4 mmHg    Systemic Diam: 2.00 cm MR Mean grad: 89.0 mmHg MR Vmax:      605.00 cm/s MR Vmean:     438.0 cm/s MV E velocity: 115.00 cm/s MV A velocity: 103.00 cm/s MV E/A ratio:  1.12  Redell Shallow MD Electronically signed by Redell Shallow MD Signature Date/Time: 10/11/2024/4:06:58 PM    Final          ______________________________________________________________________________________________      ECG ***    I have independently reviewed the above radiologic studies and discussed with the patient   Recent Lab Findings: Lab Results  Component Value Date   WBC  10.9 (H) 10/13/2024   HGB 9.6 (L) 10/13/2024   HCT 28.5 (L) 10/13/2024   PLT 297 10/13/2024   GLUCOSE 109 (H) 10/14/2024   CHOL 113 10/12/2024   TRIG 45 10/12/2024   HDL 59 10/12/2024   LDLCALC 45 10/12/2024   ALT 23 10/12/2024   AST 25 10/12/2024   NA 132 (L) 10/14/2024   K 3.7 10/14/2024   CL 96 (L) 10/14/2024   CREATININE 1.14 (H) 10/14/2024   BUN 25 (H) 10/14/2024   CO2 24 10/14/2024   INR 1.1 09/13/2020   HGBA1C 5.6 10/11/2024      Assessment / Plan:   83 y.o. female with severe aortic stenosis.  STS score: ***.  NYHA Class ***.  The risks and benefits of *** TAVR were discussed in detail.  We also discussed possibility of an emergent sternotomy to address any procedural complications.  Based on our discussion, we collectively decided that an emergent sternotomy would *** be indicated.  The patient is *** agreeable to proceed.  Based on my review of her LHC, echo, and CTA, I agree with the multidisciplinary plan to proceed with a *** TAVR.      I  spent {CHL ONC TIME VISIT -  DTPQU:[id number]} counseling the patient face to face.   Linnie MALVA Rayas 10/23/2024 8:21 AM

## 2024-10-25 ENCOUNTER — Inpatient Hospital Stay: Admitting: Surgery

## 2024-10-27 ENCOUNTER — Encounter: Admitting: Thoracic Surgery (Cardiothoracic Vascular Surgery)

## 2024-10-27 ENCOUNTER — Inpatient Hospital Stay: Admitting: Thoracic Surgery (Cardiothoracic Vascular Surgery)

## 2024-10-27 ENCOUNTER — Ambulatory Visit
Attending: Thoracic Surgery (Cardiothoracic Vascular Surgery) | Admitting: Thoracic Surgery (Cardiothoracic Vascular Surgery)

## 2024-10-27 VITALS — BP 138/70 | HR 79 | Resp 20 | Ht 62.0 in | Wt 170.4 lb

## 2024-10-27 DIAGNOSIS — I350 Nonrheumatic aortic (valve) stenosis: Secondary | ICD-10-CM

## 2024-10-27 NOTE — Progress Notes (Signed)
 [address]       [place] [address]             [phone]        Rhonda [name] [facility] Medical Record #[id number] Date of Birth: October 19, 1941  Referring: Elliot Charm,* Primary Care: Elliot Charm, MD Primary Cardiologist:Christopher LITTIE Nanas, MD  Chief Complaint:    Chief Complaint  Patient presents with   Aortic Stenosis    Review TAVR workup    History of Present Illness:     Rhonda [name] is a 83 y.o. female presents for surgical evaluation of severe aortic stenosis.  She is symptomatic with shortness of breath on exertion.  She was recently admitted with a heart failure exacerbation.        Past Medical History:  Diagnosis Date   Anxiety    pt. may have panicy feeling upon awaking from anesth. - states she feels anxious at times  & has used Paxil  & xanax in the [past but its been a very long time ago    Arthritis    rheumatoid & OA-hands, shoulders    Dyspnea    GERD (gastroesophageal reflux disease)    Heart murmur    Hypertension     Past Surgical History:  Procedure Laterality Date   APPENDECTOMY     laproscopic   BREAST BIOPSY Left 09/10/2023   MM LT BREAST BX W LOC DEV 1ST LESION IMAGE BX SPEC STEREO GUIDE 09/10/2023 [facility]   BREAST BIOPSY Left 09/10/2023   MM LT BREAST BX W LOC DEV EA AD LESION IMG BX SPEC STEREO GUIDE 09/10/2023 [facility]   BREAST EXCISIONAL BIOPSY Left    COLONOSCOPY     HEMORROIDECTOMY     INSERTION OF MESH N/A 09/12/2018   Procedure: INSERTION OF MESH;  Surgeon: Curvin Deward MOULD, MD;  Location: [facility] OR;  Service: General;  Laterality: N/A;   LAPAROSCOPIC APPENDECTOMY N/A 01/06/2017   Procedure: LAPAROSCOPIC APPENDECTOMY;  Surgeon: Deward Curvin III, MD;  Location: [facility] OR;  Service: General;  Laterality: N/A;   RIGHT HEART CATH AND CORONARY ANGIOGRAPHY N/A 10/12/2024   Procedure: RIGHT HEART CATH AND CORONARY ANGIOGRAPHY;  Surgeon: Verlin Lonni BIRCH, MD;  Location:  [facility] INVASIVE CV [facility];  Service: Cardiovascular;  Laterality: N/A;   TONSILLECTOMY     as a child   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   Procedure: HIP ARTHROPLASTY ANTERIOR APPROACH;  Surgeon: Addie Cordella Hamilton, MD;  Location[facility];  Service: Orthopedics;  Laterality: Left;   VAGINAL DELIVERY     x2   VENTRAL HERNIA REPAIR N/A 09/12/2018   Procedure: LAPAROSCOPIC VENTRAL HERNIA REPAIR WITH MESH;  Surgeon: Curvin Deward MOULD, MD;  Location: [facility] OR;  Service: General;  Laterality: N/A;   VIDEO BRONCHOSCOPY Bilateral 11/07/2019   Procedure: VIDEO BRONCHOSCOPY WITH FLUORO;  Surgeon: Shellia Oh, MD;  Location: [facility] ENDOSCOPY;  Service: Endoscopy;  Laterality: Bilateral;    Social History:  Social History   Tobacco Use  Smoking Status Never  Smokeless Tobacco Never    Social History   Substance and Sexual Activity  Alcohol  Use Yes   Comment: daily- wine & bourbon & ginger      Allergies  Allergen Reactions   Amlodipine  Other (See Comments)   Atorvastatin Other (See Comments)   Lisinopril Other (See Comments)   Meloxicam Other (See Comments)    Other Reaction(s): stomach upset, hives   Aleve [Naproxen] Other (See Comments)  GI upset      Current Outpatient Medications  Medication Sig Dispense Refill   acetaminophen  (TYLENOL ) 650 MG CR tablet Take 1,300 mg by mouth every 8 (eight) hours as needed for pain.     alendronate (FOSAMAX) 70 MG tablet Take 70 mg by mouth once a week.     anagrelide  (AGRYLIN) 0.5 MG capsule Take 1 capsule (0.5 mg total) by mouth 2 (two) times daily. 60 capsule 3   Carboxymethylcellulose Sodium (REFRESH LIQUIGEL) 1 % GEL Place 1 drop into both eyes at bedtime.     folic acid  (FOLVITE ) 800 MCG tablet Take 800 mcg by mouth daily.      furosemide (LASIX) 40 MG tablet Take 1 tablet (40 mg total) by mouth daily. 30 tablet 0   hydroxychloroquine  (PLAQUENIL ) 200 MG tablet TAKE 1 TABLET BY MOUTH TWICE  DAILY 180 tablet 3   levothyroxine  (SYNTHROID ,  LEVOTHROID) 25 MCG tablet Take 25 mcg by mouth daily before breakfast.      methotrexate  (RHEUMATREX) 2.5 MG tablet Take 12.5 mg by mouth every Friday.      Multiple Minerals-Vitamins (CALCIUM-MAGNESIUM-ZINC-D3 PO) Take 1 tablet by mouth 2 (two) times daily.      Multiple Vitamin (MULTIVITAMIN WITH MINERALS) TABS tablet Take 1 tablet by mouth daily.     olmesartan (BENICAR) 20 MG tablet Take 0.5 tablets (10 mg total) by mouth daily.     omeprazole (PRILOSEC) 40 MG capsule Take 40 mg by mouth daily.     PARoxetine  (PAXIL ) 20 MG tablet Take 20 mg by mouth daily.     polyethylene glycol (MIRALAX / GLYCOLAX) packet Take 17 g by mouth daily.      Polyvinyl Alcohol -Povidone PF (REFRESH) 1.4-0.6 % SOLN Place 1 drop into both eyes daily.     Probiotic Product (PROBIOTIC DAILY PO) Take 1 capsule by mouth daily.     simvastatin  (ZOCOR ) 40 MG tablet Take 40 mg by mouth at bedtime.      No current facility-administered medications for this visit.    (Not in a hospital admission)   Family History  Problem Relation Age of Onset   Breast cancer Daughter    Breast cancer Maternal Grandfather      Review of Systems:   Review of Systems  Constitutional:  Positive for malaise/fatigue.  Respiratory:  Positive for shortness of breath.   Cardiovascular:  Negative for chest pain.  Neurological:  Negative for dizziness.      Physical Exam: BP 138/70 (BP Location: Left Arm, Patient Position: Sitting, Cuff Size: Normal)   Pulse 79   Resp 20   Ht 5' 2 (1.575 m)   Wt 170 lb 6.4 oz (77.3 kg)   SpO2 97%   BMI 31.17 kg/m  Physical Exam Constitutional:      General: She is not in acute distress.    Appearance: She is not ill-appearing.  HENT:     Head: Normocephalic and atraumatic.  Eyes:     Extraocular Movements: Extraocular movements intact.  Cardiovascular:     Rate and Rhythm: Normal rate.  Pulmonary:     Effort: Pulmonary effort is normal. No respiratory distress.  Abdominal:      General: Abdomen is flat. There is no distension.  Musculoskeletal:        General: Normal range of motion.     Cervical back: Normal range of motion.  Skin:    General: Skin is warm and dry.  Neurological:     General: No focal deficit present.  Mental Status: She is alert and oriented to person, place, and time.       Cardiac Studies & Procedures   ______________________________________________________________________________________________ CARDIAC CATHETERIZATION  CARDIAC CATHETERIZATION 10/12/2024  Conclusion   Prox RCA to Mid RCA lesion is 30% stenosed.   Ost Cx to Prox Cx lesion is 30% stenosed.   Prox LAD to Mid LAD lesion is 30% stenosed.  Mild non-obstructive CAD Mild elevation right heart pressures  Continue workup for TAVR. Medical management of CAD.  Findings Coronary Findings Diagnostic  Dominance: Right  Left Anterior Descending Vessel is large. Prox LAD to Mid LAD lesion is 30% stenosed. The lesion is calcified.  Left Circumflex Vessel is large. Ost Cx to Prox Cx lesion is 30% stenosed. The lesion is calcified.  Right Coronary Artery Vessel is large. Prox RCA to Mid RCA lesion is 30% stenosed. The lesion is calcified.  Intervention  No interventions have been documented.     ECHOCARDIOGRAM  ECHOCARDIOGRAM COMPLETE 10/11/2024  Narrative ECHOCARDIOGRAM REPORT    Patient Name:   Rhonda [name] Date of Exam: 10/11/2024 Medical Rec #:  [id number]      Height:       63.0 in Accession #:    [id number]     Weight:       183.0 lb Date of Birth:  Aug 14, 1941      BSA:          1.862 m Patient Age:    83 years       BP:           101/90 mmHg Patient Gender: F              HR:           99 bpm. Exam Location:  Inpatient  Procedure: 2D Echo, Cardiac Doppler, Color Doppler and Intracardiac Opacification Agent (Both Spectral and Color Flow Doppler were utilized during procedure).  Indications:    CHF I50.9  History:        Patient has no  prior history of Echocardiogram examinations. CHF, Signs/Symptoms:Murmur and Dyspnea; Risk Factors:Hypertension.  Sonographer:    BERNARDA ROCKS Referring Phys: AUGUSTA.BIGNESS STEVEN J NEWTON  IMPRESSIONS   1. Hypokinesis of the distal septum and apex with overall low normal LV function. 2. Left ventricular ejection fraction, by estimation, is 50 to 55%. The left ventricle has low normal function. The left ventricle demonstrates regional wall motion abnormalities (see scoring diagram/findings for description). The left ventricular internal cavity size was mildly dilated. There is mild left ventricular hypertrophy of the basal-septal segment. Left ventricular diastolic parameters are indeterminate. 3. Right ventricular systolic function is normal. The right ventricular size is normal. There is mildly elevated pulmonary artery systolic pressure. 4. Left atrial size was severely dilated. 5. The mitral valve is normal in structure. Mild mitral valve regurgitation. No evidence of mitral stenosis. Moderate mitral annular calcification. 6. The aortic valve is calcified. Aortic valve regurgitation is trivial. Severe aortic valve stenosis. 7. The inferior vena cava is normal in size with <50% respiratory variability, suggesting right atrial pressure of 8 mmHg.  Comparison(s): No prior Echocardiogram.  FINDINGS Left Ventricle: Left ventricular ejection fraction, by estimation, is 50 to 55%. The left ventricle has low normal function. The left ventricle demonstrates regional wall motion abnormalities. Definity contrast agent was given IV to delineate the left ventricular endocardial borders. The left ventricular internal cavity size was mildly dilated. There is mild left ventricular hypertrophy of the basal-septal segment. Left ventricular diastolic parameters are  indeterminate.  Right Ventricle: The right ventricular size is normal. Right ventricular systolic function is normal. There is mildly elevated  pulmonary artery systolic pressure. The tricuspid regurgitant velocity is 3.05 m/s, and with an assumed right atrial pressure of 3 mmHg, the estimated right ventricular systolic pressure is 40.2 mmHg.  Left Atrium: Left atrial size was severely dilated.  Right Atrium: Right atrial size was normal in size.  Pericardium: There is no evidence of pericardial effusion.  Mitral Valve: The mitral valve is normal in structure. Moderate mitral annular calcification. Mild mitral valve regurgitation. No evidence of mitral valve stenosis. MV peak gradient, 11.4 mmHg. The mean mitral valve gradient is 4.0 mmHg.  Tricuspid Valve: The tricuspid valve is normal in structure. Tricuspid valve regurgitation is trivial. No evidence of tricuspid stenosis.  Aortic Valve: The aortic valve is calcified. Aortic valve regurgitation is trivial. Aortic regurgitation PHT measures 243 msec. Severe aortic stenosis is present. Aortic valve mean gradient measures 48.0 mmHg. Aortic valve peak gradient measures 81.0 mmHg. Aortic valve area, by VTI measures 0.44 cm.  Pulmonic Valve: The pulmonic valve was normal in structure. Pulmonic valve regurgitation is not visualized. No evidence of pulmonic stenosis.  Aorta: The aortic root is normal in size and structure.  Venous: The inferior vena cava is normal in size with less than 50% respiratory variability, suggesting right atrial pressure of 8 mmHg.  IAS/Shunts: No atrial level shunt detected by color flow Doppler.  Additional Comments: Hypokinesis of the distal septum and apex with overall low normal LV function.   LEFT VENTRICLE PLAX 2D LVIDd:         5.90 cm      Diastology LVIDs:         3.90 cm      LV e' medial:    2.50 cm/s LV PW:         0.80 cm      LV E/e' medial:  46.0 LV IVS:        0.90 cm      LV e' lateral:   6.85 cm/s LVOT diam:     2.00 cm      LV E/e' lateral: 16.8 LV SV:         50 LV SV Index:   27 LVOT Area:     3.14 cm  LV Volumes (MOD) LV  vol d, MOD A2C: 179.0 ml LV vol d, MOD A4C: 180.0 ml LV vol s, MOD A2C: 83.1 ml LV vol s, MOD A4C: 81.2 ml LV SV MOD A2C:     95.9 ml LV SV MOD A4C:     180.0 ml LV SV MOD BP:      97.5 ml  RIGHT VENTRICLE             IVC RV Basal diam:  3.20 cm     IVC diam: 1.70 cm RV S prime:     17.90 cm/s TAPSE (M-mode): 2.0 cm RVSP:           45.2 mmHg  LEFT ATRIUM             Index        RIGHT ATRIUM           Index LA diam:        4.40 cm 2.36 cm/m   RA Pressure: 8.00 mmHg LA Vol (A2C):   98.5 ml 52.90 ml/m  RA Area:     10.40 cm LA Vol (A4C):   85.8 ml 46.08 ml/m  RA Volume:   22.80 ml  12.25 ml/m LA Biplane Vol: 99.2 ml 53.28 ml/m AORTIC VALVE                     PULMONIC VALVE AV Area (Vmax):    0.51 cm      PV Vmax:          1.02 m/s AV Area (Vmean):   0.42 cm      PV Peak grad:     4.2 mmHg AV Area (VTI):     0.44 cm      PR End Diast Vel: 1.08 msec AV Vmax:           450.00 cm/s AV Vmean:          331.000 cm/s AV VTI:            1.140 m AV Peak Grad:      81.0 mmHg AV Mean Grad:      48.0 mmHg LVOT Vmax:         72.70 cm/s LVOT Vmean:        44.400 cm/s LVOT VTI:          0.158 m LVOT/AV VTI ratio: 0.14 AI PHT:            243 msec  AORTA Ao Root diam: 2.80 cm Ao Asc diam:  3.10 cm  MITRAL VALVE                TRICUSPID VALVE MV Area (PHT): 5.42 cm     TR Peak grad:   37.2 mmHg MV Area VTI:   2.48 cm     TR Vmax:        305.00 cm/s MV Peak grad:  11.4 mmHg    Estimated RAP:  8.00 mmHg MV Mean grad:  4.0 mmHg     RVSP:           45.2 mmHg MV Vmax:       1.69 m/s MV Vmean:      90.3 cm/s    SHUNTS MV Decel Time: 140 msec     Systemic VTI:  0.16 m MR Peak grad: 146.4 mmHg    Systemic Diam: 2.00 cm MR Mean grad: 89.0 mmHg MR Vmax:      605.00 cm/s MR Vmean:     438.0 cm/s MV E velocity: 115.00 cm/s MV A velocity: 103.00 cm/s MV E/A ratio:  1.12  Redell Shallow MD Electronically signed by Redell Shallow MD Signature Date/Time: 10/11/2024/4:06:58  PM    Final      CT SCANS  CT CORONARY MORPH W/CTA COR W/SCORE 10/23/2024  Addendum 10/23/2024 10:40 PM ADDENDUM REPORT: 10/23/2024 22:37  EXAM: OVER-READ INTERPRETATION  CT CHEST  The following report is an over-read performed by radiologist Dr. Luke Siad [facility] on 10/23/2024. This over-read does not include interpretation of cardiac or coronary anatomy or pathology. The coronary CTA interpretation by the cardiologist is attached.  COMPARISON:  CT 10/23/2024  FINDINGS: Lower chest: Included lung bases are grossly clear. Aortic atherosclerosis.  Upper abdomen: No acute finding  Musculoskeletal: Degenerative changes.  No acute finding  IMPRESSION: 1. No acute or significant extracardiac findings. 2. Aortic atherosclerosis.  Aortic Atherosclerosis (ICD10-I70.0).   Electronically Signed By: Luke Bun M.D. On: 10/23/2024 22:37  Narrative CLINICAL DATA:  Severe Aortic Stenosis.  EXAM: Cardiac TAVR CT  TECHNIQUE: A non-contrast, gated CT scan was obtained with axial slices of 2.5 mm through the heart for aortic  valve scoring. A 120 kV retrospective, gated, contrast cardiac scan was obtained. Gantry rotation speed was 230 msec and collimation was 0.63 mm. Nitroglycerin was not given. A delayed scan was obtained to exclude left atrial appendage thrombus. The 3D dataset was reconstructed in systole with motion correction. The 3D data set was reconstructed in 5% intervals of the 0-95% of the R-R cycle. Systolic and diastolic phases were analyzed on a dedicated workstation using MPR, MIP, and VRT modes. The patient received 100 cc of contrast.  FINDINGS: Aortic Valve: Valve Morphology: Tricuspid with moderate symmetric calcification and reduced excursion with the planimeter valve area is 0.9 Sq cm consistent with severe aortic stenosis  Annular and subannular calcification: None  Aortic Valve Calcium Score: 1436  Perimembranous  septal diameter: 5 mm  Mitral Valve: Severe mitral annular calcification.  Aortic Annulus Measurements- 30 % Phase  Major annulus diameter: 28 mm  Minor annulus diameter:21 mm  Annular perimeter: 77 mm  Annular area: 4.47 cm2  Aortic Measurements- 75% phase  Sinotubular Junction: 28 mm  Ascending Thoracic Aorta: 31 mm  Descending Thoracic Aorta: 24 mm  Aortic atherosclerosis.  Sinus of Valsalva Measurements:  Right coronary cusp width: 28 mm  Left coronary cusp width: 28 mm  Non coronary cusp width: 30 mm  Coronary Artery Height above Annulus:  Left Main: 17 mm  Left SoV height: 22 mm  Right Coronary: 14 mm  Right SoV height: 22 mm  Optimum Fluoroscopic Angle for Delivery: LAO 8, CAU 7  Valves for structural team consideration:  26 mm Sapien  29 mm Evolut feasible with borderline average sinus measurements (diameter)  Non TAVR Valve Findings:  Coronary Arteries: Normal coronary origin. Study not completed with nitroglycerin. With this caveat:  Mild calcified proximal RCA.  Mild to moderate mixed plaque in the proximal LAD- correlation to heart catheterization is recommended.  Mild to moderate mixed plaque in the proximal LCX- correlation to heart catheterization is recommended.  Coronary Calcium Score:  Left main: 179  Left anterior descending artery: 157  Left circumflex artery: 375  Right coronary artery: 522  Total: 1233  Percentile: 90th for age, sex, and race matched control.  Systemic veins: Normal anatomy.  Main Pulmonary artery: Normal caliber.  Pulmonary veins: Normal variant anatomy- multiple right middle pulmonary veins.  Left atrial appendage: Patent.  Interatrial septum: No PFO or ASD.  Chamber dimensions: Mild RV dilation.  Pericardium: No calcifications.  Extra Cardiac Findings as per separate reporting.  Image quality: Excellent.  Noise artifact is: Limited.  IMPRESSION: 1. Severe Aortic stenosis.  Measurements for potential interventions as above.  Stanly Leavens MD  Electronically Signed: By: Stanly Leavens M.D. On: 10/23/2024 11:39     ______________________________________________________________________________________________      ECG sinus    I have independently reviewed the above radiologic studies and discussed with the patient   Recent Lab Findings: Lab Results  Component Value Date   WBC 10.9 (H) 10/13/2024   HGB 9.6 (L) 10/13/2024   HCT 28.5 (L) 10/13/2024   PLT 297 10/13/2024   GLUCOSE 109 (H) 10/14/2024   CHOL 113 10/12/2024   TRIG 45 10/12/2024   HDL 59 10/12/2024   LDLCALC 45 10/12/2024   ALT 23 10/12/2024   AST 25 10/12/2024   NA 132 (L) 10/14/2024   K 3.7 10/14/2024   CL 96 (L) 10/14/2024   CREATININE 1.14 (H) 10/14/2024   BUN 25 (H) 10/14/2024   CO2 24 10/14/2024   INR 1.1 09/13/2020  HGBA1C 5.6 10/11/2024      Assessment / Plan:   83 y.o. female with severe aortic stenosis.  STS score: 9.74.  NYHA Class II.  The risks and benefits of transfemoral  TAVR were discussed in detail.  We also discussed possibility of an emergent sternotomy to address any procedural complications.  Based on our discussion, we collectively decided that an emergent sternotomy would not be indicated.  The patient is agreeable to proceed.  Based on my review of her LHC, echo, and CTA, I agree with the multidisciplinary plan to proceed with a transfemoral TAVR.      I  spent 40 minutes counseling the patient face to face.   Linnie MALVA Rayas 10/27/2024 2:53 PM

## 2024-10-27 NOTE — H&P (View-Only) (Signed)
 [address]       [place] [address]             [phone]        ISOBELLA ASCHER [facility] Medical Record #[id number] Date of Birth: October 19, 1941  Referring: Elliot Charm,* Primary Care: Elliot Charm, MD Primary Cardiologist:Christopher LITTIE Nanas, MD  Chief Complaint:    Chief Complaint  Patient presents with   Aortic Stenosis    Review TAVR workup    History of Present Illness:     RADIAH LUBINSKI is a 83 y.o. female presents for surgical evaluation of severe aortic stenosis.  She is symptomatic with shortness of breath on exertion.  She was recently admitted with a heart failure exacerbation.        Past Medical History:  Diagnosis Date   Anxiety    pt. may have panicy feeling upon awaking from anesth. - states she feels anxious at times  & has used Paxil  & xanax in the [past but its been a very long time ago    Arthritis    rheumatoid & OA-hands, shoulders    Dyspnea    GERD (gastroesophageal reflux disease)    Heart murmur    Hypertension     Past Surgical History:  Procedure Laterality Date   APPENDECTOMY     laproscopic   BREAST BIOPSY Left 09/10/2023   MM LT BREAST BX W LOC DEV 1ST LESION IMAGE BX SPEC STEREO GUIDE 09/10/2023 [facility]   BREAST BIOPSY Left 09/10/2023   MM LT BREAST BX W LOC DEV EA AD LESION IMG BX SPEC STEREO GUIDE 09/10/2023 [facility]   BREAST EXCISIONAL BIOPSY Left    COLONOSCOPY     HEMORROIDECTOMY     INSERTION OF MESH N/A 09/12/2018   Procedure: INSERTION OF MESH;  Surgeon: Curvin Deward MOULD, MD;  Location: [facility] OR;  Service: General;  Laterality: N/A;   LAPAROSCOPIC APPENDECTOMY N/A 01/06/2017   Procedure: LAPAROSCOPIC APPENDECTOMY;  Surgeon: Deward Curvin III, MD;  Location: [facility] OR;  Service: General;  Laterality: N/A;   RIGHT HEART CATH AND CORONARY ANGIOGRAPHY N/A 10/12/2024   Procedure: RIGHT HEART CATH AND CORONARY ANGIOGRAPHY;  Surgeon: Verlin Lonni BIRCH, MD;  Location:  [facility] INVASIVE CV [facility];  Service: Cardiovascular;  Laterality: N/A;   TONSILLECTOMY     as a child   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   Procedure: HIP ARTHROPLASTY ANTERIOR APPROACH;  Surgeon: Addie Cordella Hamilton, MD;  Location[facility];  Service: Orthopedics;  Laterality: Left;   VAGINAL DELIVERY     x2   VENTRAL HERNIA REPAIR N/A 09/12/2018   Procedure: LAPAROSCOPIC VENTRAL HERNIA REPAIR WITH MESH;  Surgeon: Curvin Deward MOULD, MD;  Location: [facility] OR;  Service: General;  Laterality: N/A;   VIDEO BRONCHOSCOPY Bilateral 11/07/2019   Procedure: VIDEO BRONCHOSCOPY WITH FLUORO;  Surgeon: Shellia Oh, MD;  Location: [facility] ENDOSCOPY;  Service: Endoscopy;  Laterality: Bilateral;    Social History:  Social History   Tobacco Use  Smoking Status Never  Smokeless Tobacco Never    Social History   Substance and Sexual Activity  Alcohol  Use Yes   Comment: daily- wine & bourbon & ginger      Allergies  Allergen Reactions   Amlodipine  Other (See Comments)   Atorvastatin Other (See Comments)   Lisinopril Other (See Comments)   Meloxicam Other (See Comments)    Other Reaction(s): stomach upset, hives   Aleve [Naproxen] Other (See Comments)  GI upset      Current Outpatient Medications  Medication Sig Dispense Refill   acetaminophen  (TYLENOL ) 650 MG CR tablet Take 1,300 mg by mouth every 8 (eight) hours as needed for pain.     alendronate (FOSAMAX) 70 MG tablet Take 70 mg by mouth once a week.     anagrelide  (AGRYLIN) 0.5 MG capsule Take 1 capsule (0.5 mg total) by mouth 2 (two) times daily. 60 capsule 3   Carboxymethylcellulose Sodium (REFRESH LIQUIGEL) 1 % GEL Place 1 drop into both eyes at bedtime.     folic acid  (FOLVITE ) 800 MCG tablet Take 800 mcg by mouth daily.      furosemide (LASIX) 40 MG tablet Take 1 tablet (40 mg total) by mouth daily. 30 tablet 0   hydroxychloroquine  (PLAQUENIL ) 200 MG tablet TAKE 1 TABLET BY MOUTH TWICE  DAILY 180 tablet 3   levothyroxine  (SYNTHROID ,  LEVOTHROID) 25 MCG tablet Take 25 mcg by mouth daily before breakfast.      methotrexate  (RHEUMATREX) 2.5 MG tablet Take 12.5 mg by mouth every Friday.      Multiple Minerals-Vitamins (CALCIUM-MAGNESIUM-ZINC-D3 PO) Take 1 tablet by mouth 2 (two) times daily.      Multiple Vitamin (MULTIVITAMIN WITH MINERALS) TABS tablet Take 1 tablet by mouth daily.     olmesartan (BENICAR) 20 MG tablet Take 0.5 tablets (10 mg total) by mouth daily.     omeprazole (PRILOSEC) 40 MG capsule Take 40 mg by mouth daily.     PARoxetine  (PAXIL ) 20 MG tablet Take 20 mg by mouth daily.     polyethylene glycol (MIRALAX / GLYCOLAX) packet Take 17 g by mouth daily.      Polyvinyl Alcohol -Povidone PF (REFRESH) 1.4-0.6 % SOLN Place 1 drop into both eyes daily.     Probiotic Product (PROBIOTIC DAILY PO) Take 1 capsule by mouth daily.     simvastatin  (ZOCOR ) 40 MG tablet Take 40 mg by mouth at bedtime.      No current facility-administered medications for this visit.    (Not in a hospital admission)   Family History  Problem Relation Age of Onset   Breast cancer Daughter    Breast cancer Maternal Grandfather      Review of Systems:   Review of Systems  Constitutional:  Positive for malaise/fatigue.  Respiratory:  Positive for shortness of breath.   Cardiovascular:  Negative for chest pain.  Neurological:  Negative for dizziness.      Physical Exam: BP 138/70 (BP Location: Left Arm, Patient Position: Sitting, Cuff Size: Normal)   Pulse 79   Resp 20   Ht 5' 2 (1.575 m)   Wt 170 lb 6.4 oz (77.3 kg)   SpO2 97%   BMI 31.17 kg/m  Physical Exam Constitutional:      General: She is not in acute distress.    Appearance: She is not ill-appearing.  HENT:     Head: Normocephalic and atraumatic.  Eyes:     Extraocular Movements: Extraocular movements intact.  Cardiovascular:     Rate and Rhythm: Normal rate.  Pulmonary:     Effort: Pulmonary effort is normal. No respiratory distress.  Abdominal:      General: Abdomen is flat. There is no distension.  Musculoskeletal:        General: Normal range of motion.     Cervical back: Normal range of motion.  Skin:    General: Skin is warm and dry.  Neurological:     General: No focal deficit present.  Mental Status: She is alert and oriented to person, place, and time.       Cardiac Studies & Procedures   ______________________________________________________________________________________________ CARDIAC CATHETERIZATION  CARDIAC CATHETERIZATION 10/12/2024  Conclusion   Prox RCA to Mid RCA lesion is 30% stenosed.   Ost Cx to Prox Cx lesion is 30% stenosed.   Prox LAD to Mid LAD lesion is 30% stenosed.  Mild non-obstructive CAD Mild elevation right heart pressures  Continue workup for TAVR. Medical management of CAD.  Findings Coronary Findings Diagnostic  Dominance: Right  Left Anterior Descending Vessel is large. Prox LAD to Mid LAD lesion is 30% stenosed. The lesion is calcified.  Left Circumflex Vessel is large. Ost Cx to Prox Cx lesion is 30% stenosed. The lesion is calcified.  Right Coronary Artery Vessel is large. Prox RCA to Mid RCA lesion is 30% stenosed. The lesion is calcified.  Intervention  No interventions have been documented.     ECHOCARDIOGRAM  ECHOCARDIOGRAM COMPLETE 10/11/2024  Narrative ECHOCARDIOGRAM REPORT    Patient Name:   Starr JONELLE ALDERTON Date of Exam: 10/11/2024 Medical Rec #:  [id number]      Height:       63.0 in Accession #:    [id number]     Weight:       183.0 lb Date of Birth:  Aug 14, 1941      BSA:          1.862 m Patient Age:    83 years       BP:           101/90 mmHg Patient Gender: F              HR:           99 bpm. Exam Location:  Inpatient  Procedure: 2D Echo, Cardiac Doppler, Color Doppler and Intracardiac Opacification Agent (Both Spectral and Color Flow Doppler were utilized during procedure).  Indications:    CHF I50.9  History:        Patient has no  prior history of Echocardiogram examinations. CHF, Signs/Symptoms:Murmur and Dyspnea; Risk Factors:Hypertension.  Sonographer:    BERNARDA ROCKS Referring Phys: AUGUSTA.BIGNESS STEVEN J NEWTON  IMPRESSIONS   1. Hypokinesis of the distal septum and apex with overall low normal LV function. 2. Left ventricular ejection fraction, by estimation, is 50 to 55%. The left ventricle has low normal function. The left ventricle demonstrates regional wall motion abnormalities (see scoring diagram/findings for description). The left ventricular internal cavity size was mildly dilated. There is mild left ventricular hypertrophy of the basal-septal segment. Left ventricular diastolic parameters are indeterminate. 3. Right ventricular systolic function is normal. The right ventricular size is normal. There is mildly elevated pulmonary artery systolic pressure. 4. Left atrial size was severely dilated. 5. The mitral valve is normal in structure. Mild mitral valve regurgitation. No evidence of mitral stenosis. Moderate mitral annular calcification. 6. The aortic valve is calcified. Aortic valve regurgitation is trivial. Severe aortic valve stenosis. 7. The inferior vena cava is normal in size with <50% respiratory variability, suggesting right atrial pressure of 8 mmHg.  Comparison(s): No prior Echocardiogram.  FINDINGS Left Ventricle: Left ventricular ejection fraction, by estimation, is 50 to 55%. The left ventricle has low normal function. The left ventricle demonstrates regional wall motion abnormalities. Definity contrast agent was given IV to delineate the left ventricular endocardial borders. The left ventricular internal cavity size was mildly dilated. There is mild left ventricular hypertrophy of the basal-septal segment. Left ventricular diastolic parameters are  indeterminate.  Right Ventricle: The right ventricular size is normal. Right ventricular systolic function is normal. There is mildly elevated  pulmonary artery systolic pressure. The tricuspid regurgitant velocity is 3.05 m/s, and with an assumed right atrial pressure of 3 mmHg, the estimated right ventricular systolic pressure is 40.2 mmHg.  Left Atrium: Left atrial size was severely dilated.  Right Atrium: Right atrial size was normal in size.  Pericardium: There is no evidence of pericardial effusion.  Mitral Valve: The mitral valve is normal in structure. Moderate mitral annular calcification. Mild mitral valve regurgitation. No evidence of mitral valve stenosis. MV peak gradient, 11.4 mmHg. The mean mitral valve gradient is 4.0 mmHg.  Tricuspid Valve: The tricuspid valve is normal in structure. Tricuspid valve regurgitation is trivial. No evidence of tricuspid stenosis.  Aortic Valve: The aortic valve is calcified. Aortic valve regurgitation is trivial. Aortic regurgitation PHT measures 243 msec. Severe aortic stenosis is present. Aortic valve mean gradient measures 48.0 mmHg. Aortic valve peak gradient measures 81.0 mmHg. Aortic valve area, by VTI measures 0.44 cm.  Pulmonic Valve: The pulmonic valve was normal in structure. Pulmonic valve regurgitation is not visualized. No evidence of pulmonic stenosis.  Aorta: The aortic root is normal in size and structure.  Venous: The inferior vena cava is normal in size with less than 50% respiratory variability, suggesting right atrial pressure of 8 mmHg.  IAS/Shunts: No atrial level shunt detected by color flow Doppler.  Additional Comments: Hypokinesis of the distal septum and apex with overall low normal LV function.   LEFT VENTRICLE PLAX 2D LVIDd:         5.90 cm      Diastology LVIDs:         3.90 cm      LV e' medial:    2.50 cm/s LV PW:         0.80 cm      LV E/e' medial:  46.0 LV IVS:        0.90 cm      LV e' lateral:   6.85 cm/s LVOT diam:     2.00 cm      LV E/e' lateral: 16.8 LV SV:         50 LV SV Index:   27 LVOT Area:     3.14 cm  LV Volumes (MOD) LV  vol d, MOD A2C: 179.0 ml LV vol d, MOD A4C: 180.0 ml LV vol s, MOD A2C: 83.1 ml LV vol s, MOD A4C: 81.2 ml LV SV MOD A2C:     95.9 ml LV SV MOD A4C:     180.0 ml LV SV MOD BP:      97.5 ml  RIGHT VENTRICLE             IVC RV Basal diam:  3.20 cm     IVC diam: 1.70 cm RV S prime:     17.90 cm/s TAPSE (M-mode): 2.0 cm RVSP:           45.2 mmHg  LEFT ATRIUM             Index        RIGHT ATRIUM           Index LA diam:        4.40 cm 2.36 cm/m   RA Pressure: 8.00 mmHg LA Vol (A2C):   98.5 ml 52.90 ml/m  RA Area:     10.40 cm LA Vol (A4C):   85.8 ml 46.08 ml/m  RA Volume:   22.80 ml  12.25 ml/m LA Biplane Vol: 99.2 ml 53.28 ml/m AORTIC VALVE                     PULMONIC VALVE AV Area (Vmax):    0.51 cm      PV Vmax:          1.02 m/s AV Area (Vmean):   0.42 cm      PV Peak grad:     4.2 mmHg AV Area (VTI):     0.44 cm      PR End Diast Vel: 1.08 msec AV Vmax:           450.00 cm/s AV Vmean:          331.000 cm/s AV VTI:            1.140 m AV Peak Grad:      81.0 mmHg AV Mean Grad:      48.0 mmHg LVOT Vmax:         72.70 cm/s LVOT Vmean:        44.400 cm/s LVOT VTI:          0.158 m LVOT/AV VTI ratio: 0.14 AI PHT:            243 msec  AORTA Ao Root diam: 2.80 cm Ao Asc diam:  3.10 cm  MITRAL VALVE                TRICUSPID VALVE MV Area (PHT): 5.42 cm     TR Peak grad:   37.2 mmHg MV Area VTI:   2.48 cm     TR Vmax:        305.00 cm/s MV Peak grad:  11.4 mmHg    Estimated RAP:  8.00 mmHg MV Mean grad:  4.0 mmHg     RVSP:           45.2 mmHg MV Vmax:       1.69 m/s MV Vmean:      90.3 cm/s    SHUNTS MV Decel Time: 140 msec     Systemic VTI:  0.16 m MR Peak grad: 146.4 mmHg    Systemic Diam: 2.00 cm MR Mean grad: 89.0 mmHg MR Vmax:      605.00 cm/s MR Vmean:     438.0 cm/s MV E velocity: 115.00 cm/s MV A velocity: 103.00 cm/s MV E/A ratio:  1.12  Redell Shallow MD Electronically signed by Redell Shallow MD Signature Date/Time: 10/11/2024/4:06:58  PM    Final      CT SCANS  CT CORONARY MORPH W/CTA COR W/SCORE 10/23/2024  Addendum 10/23/2024 10:40 PM ADDENDUM REPORT: 10/23/2024 22:37  EXAM: OVER-READ INTERPRETATION  CT CHEST  The following report is an over-read performed by radiologist Dr. Luke Siad [facility] on 10/23/2024. This over-read does not include interpretation of cardiac or coronary anatomy or pathology. The coronary CTA interpretation by the cardiologist is attached.  COMPARISON:  CT 10/23/2024  FINDINGS: Lower chest: Included lung bases are grossly clear. Aortic atherosclerosis.  Upper abdomen: No acute finding  Musculoskeletal: Degenerative changes.  No acute finding  IMPRESSION: 1. No acute or significant extracardiac findings. 2. Aortic atherosclerosis.  Aortic Atherosclerosis (ICD10-I70.0).   Electronically Signed By: Luke Bun M.D. On: 10/23/2024 22:37  Narrative CLINICAL DATA:  Severe Aortic Stenosis.  EXAM: Cardiac TAVR CT  TECHNIQUE: A non-contrast, gated CT scan was obtained with axial slices of 2.5 mm through the heart for aortic  valve scoring. A 120 kV retrospective, gated, contrast cardiac scan was obtained. Gantry rotation speed was 230 msec and collimation was 0.63 mm. Nitroglycerin was not given. A delayed scan was obtained to exclude left atrial appendage thrombus. The 3D dataset was reconstructed in systole with motion correction. The 3D data set was reconstructed in 5% intervals of the 0-95% of the R-R cycle. Systolic and diastolic phases were analyzed on a dedicated workstation using MPR, MIP, and VRT modes. The patient received 100 cc of contrast.  FINDINGS: Aortic Valve: Valve Morphology: Tricuspid with moderate symmetric calcification and reduced excursion with the planimeter valve area is 0.9 Sq cm consistent with severe aortic stenosis  Annular and subannular calcification: None  Aortic Valve Calcium Score: 1436  Perimembranous  septal diameter: 5 mm  Mitral Valve: Severe mitral annular calcification.  Aortic Annulus Measurements- 30 % Phase  Major annulus diameter: 28 mm  Minor annulus diameter:21 mm  Annular perimeter: 77 mm  Annular area: 4.47 cm2  Aortic Measurements- 75% phase  Sinotubular Junction: 28 mm  Ascending Thoracic Aorta: 31 mm  Descending Thoracic Aorta: 24 mm  Aortic atherosclerosis.  Sinus of Valsalva Measurements:  Right coronary cusp width: 28 mm  Left coronary cusp width: 28 mm  Non coronary cusp width: 30 mm  Coronary Artery Height above Annulus:  Left Main: 17 mm  Left SoV height: 22 mm  Right Coronary: 14 mm  Right SoV height: 22 mm  Optimum Fluoroscopic Angle for Delivery: LAO 8, CAU 7  Valves for structural team consideration:  26 mm Sapien  29 mm Evolut feasible with borderline average sinus measurements (diameter)  Non TAVR Valve Findings:  Coronary Arteries: Normal coronary origin. Study not completed with nitroglycerin. With this caveat:  Mild calcified proximal RCA.  Mild to moderate mixed plaque in the proximal LAD- correlation to heart catheterization is recommended.  Mild to moderate mixed plaque in the proximal LCX- correlation to heart catheterization is recommended.  Coronary Calcium Score:  Left main: 179  Left anterior descending artery: 157  Left circumflex artery: 375  Right coronary artery: 522  Total: 1233  Percentile: 90th for age, sex, and race matched control.  Systemic veins: Normal anatomy.  Main Pulmonary artery: Normal caliber.  Pulmonary veins: Normal variant anatomy- multiple right middle pulmonary veins.  Left atrial appendage: Patent.  Interatrial septum: No PFO or ASD.  Chamber dimensions: Mild RV dilation.  Pericardium: No calcifications.  Extra Cardiac Findings as per separate reporting.  Image quality: Excellent.  Noise artifact is: Limited.  IMPRESSION: 1. Severe Aortic stenosis.  Measurements for potential interventions as above.  Stanly Leavens MD  Electronically Signed: By: Stanly Leavens M.D. On: 10/23/2024 11:39     ______________________________________________________________________________________________      ECG sinus    I have independently reviewed the above radiologic studies and discussed with the patient   Recent Lab Findings: Lab Results  Component Value Date   WBC 10.9 (H) 10/13/2024   HGB 9.6 (L) 10/13/2024   HCT 28.5 (L) 10/13/2024   PLT 297 10/13/2024   GLUCOSE 109 (H) 10/14/2024   CHOL 113 10/12/2024   TRIG 45 10/12/2024   HDL 59 10/12/2024   LDLCALC 45 10/12/2024   ALT 23 10/12/2024   AST 25 10/12/2024   NA 132 (L) 10/14/2024   K 3.7 10/14/2024   CL 96 (L) 10/14/2024   CREATININE 1.14 (H) 10/14/2024   BUN 25 (H) 10/14/2024   CO2 24 10/14/2024   INR 1.1 09/13/2020  HGBA1C 5.6 10/11/2024      Assessment / Plan:   83 y.o. female with severe aortic stenosis.  STS score: 9.74.  NYHA Class II.  The risks and benefits of transfemoral  TAVR were discussed in detail.  We also discussed possibility of an emergent sternotomy to address any procedural complications.  Based on our discussion, we collectively decided that an emergent sternotomy would not be indicated.  The patient is agreeable to proceed.  Based on my review of her LHC, echo, and CTA, I agree with the multidisciplinary plan to proceed with a transfemoral TAVR.      I  spent 40 minutes counseling the patient face to face.   Linnie MALVA Rayas 10/27/2024 2:53 PM

## 2024-10-30 ENCOUNTER — Other Ambulatory Visit: Payer: Self-pay

## 2024-10-30 DIAGNOSIS — I350 Nonrheumatic aortic (valve) stenosis: Secondary | ICD-10-CM

## 2024-11-01 ENCOUNTER — Encounter: Admitting: Surgery

## 2024-11-02 ENCOUNTER — Ambulatory Visit: Admitting: Emergency Medicine

## 2024-11-02 NOTE — Progress Notes (Signed)
 [facility] [phone]   [phone]  PROGRESS NOTE  Patient Care Team: Elliot Charm, MD as PCP - General (Internal Medicine) Kate Lonni CROME, MD as PCP - Cardiology (Cardiology)  Hematological/Oncological History # Essential thrombocytosis, CALR positive # Thrombocytosis 11/16/2023: WBC 7.2, Hgb 11.2, MCV 96.4, Plt 448 12/08/2023: establish care with Dr. Federico.  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 05/31/2024: Transition to anagrelide  1 mg twice daily as hydroxyurea  is dropping her blood counts without controlling the platelets  Interval History:  Rhonda [name] 83 y.o. female with medical history significant for newly diagnosed essential thrombocytosis who presents for a follow up visit. The patient's last visit was on 08/25/2024. In the interim since the last visit she has been well since her discharge from the hospital.  On exam today Rhonda [name] reports she is taking her anagrelide  and aspirin  as prescribed and is tolerating them well without any difficulty.  She has an upcoming surgery on Tuesday with Dr. Shyrl.  She reports that she has been taking Lasix per her cardiologist recommendations and her creatinine is elevated at 1.71.  She has had no overt signs of bleeding, bruising, or dark stools.  She reports she does not have any signs or symptoms concerning for clot such as leg pain, leg swelling, chest pain, or shortness of breath.  She reports her energy levels today are about a 3 out of 10 and she easily wears out and fatigues.  She is anxious about her upcoming surgery but otherwise is at her baseline level of health.  A full 10 point ROS is otherwise negative.  MEDICAL HISTORY:  Past Medical History:  Diagnosis Date   Anxiety    pt. may have panicy feeling upon awaking from anesth. - states she feels anxious at  times  & has used Paxil  & xanax in the [past but its been a very long time ago    Arthritis    rheumatoid & OA-hands, shoulders    Dyspnea    GERD (gastroesophageal reflux disease)    Heart murmur    Hypertension     SURGICAL HISTORY: Past Surgical History:  Procedure Laterality Date   APPENDECTOMY     laproscopic   BREAST BIOPSY Left 09/10/2023   MM LT BREAST BX W LOC DEV 1ST LESION IMAGE BX SPEC STEREO GUIDE 09/10/2023 [facility]   BREAST BIOPSY Left 09/10/2023   MM LT BREAST BX W LOC DEV EA AD LESION IMG BX SPEC STEREO GUIDE 09/10/2023 [facility]   BREAST EXCISIONAL BIOPSY Left    COLONOSCOPY     HEMORROIDECTOMY     INSERTION OF MESH N/A 09/12/2018   Procedure: INSERTION OF MESH;  Surgeon: Curvin Deward MOULD, MD;  Location: [facility] OR;  Service: General;  Laterality: N/A;   LAPAROSCOPIC APPENDECTOMY N/A 01/06/2017   Procedure: LAPAROSCOPIC APPENDECTOMY;  Surgeon: Deward Curvin III, MD;  Location: [facility] OR;  Service: General;  Laterality: N/A;   RIGHT HEART CATH AND CORONARY ANGIOGRAPHY N/A 10/12/2024   Procedure: RIGHT HEART CATH AND CORONARY ANGIOGRAPHY;  Surgeon: Verlin Lonni BIRCH, MD;  Location: [facility]AS[facility];  Service: Cardiovascular;  Laterality: N/A;   TONSILLECTOMY     as a child   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   Procedure: HIP ARTHROPLASTY ANTERIOR APPROACH;  Surgeon: Addie Cordella Hamilton, MD;  Location: [facility] OR;  Service: Orthopedics;  Laterality: Left;   VAGINAL DELIVERY  x2   VENTRAL HERNIA REPAIR N/A 09/12/2018   Procedure: LAPAROSCOPIC VENTRAL HERNIA REPAIR WITH MESH;  Surgeon: Curvin Deward MOULD, MD;  Location: [facility] OR;  Service: General;  Laterality: N/A;   VIDEO BRONCHOSCOPY Bilateral 11/07/2019   Procedure: VIDEO BRONCHOSCOPY WITH FLUORO;  Surgeon: Shellia Oh, MD;  Location: [facility];  Service: Endoscopy;  Laterality: Bilateral;    SOCIAL HISTORY: Social History   Socioeconomic History   Marital status: Married    Spouse name: Not on  file   Number of children: Not on file   Years of education: Not on file   Highest education level: Not on file  Occupational History   Not on file  Tobacco Use   Smoking status: Never   Smokeless tobacco: Never  Substance and Sexual Activity   Alcohol  use: Yes    Comment: daily- wine & bourbon & ginger    Drug use: No   Sexual activity: Not on file  Other Topics Concern   Not on file  Social History Narrative   Not on file   Social Drivers of Health   Financial Resource Strain: Not on file  Food Insecurity: No Food Insecurity (10/11/2024)   Hunger Vital Sign    Worried About Running Out of Food in the Last Year: Never true    Ran Out of Food in the Last Year: Never true  Transportation Needs: No Transportation Needs (10/11/2024)   PRAPARE - Administrator, Civil Service (Medical): No    Lack of Transportation (Non-Medical): No  Physical Activity: Not on file  Stress: Not on file  Social Connections: Moderately Integrated (10/11/2024)   Social Connection and Isolation Panel    Frequency of Communication with Friends and Family: More than three times a week    Frequency of Social Gatherings with Friends and Family: More than three times a week    Attends Religious Services: 1 to 4 times per year    Active Member of [employer] or Organizations: No    Attends Banker Meetings: Never    Marital Status: Married  Catering Manager Violence: Not At Risk (10/11/2024)   Humiliation, Afraid, Rape, and Kick questionnaire    Fear of Current or Ex-Partner: No    Emotionally Abused: No    Physically Abused: No    Sexually Abused: No    FAMILY HISTORY: Family History  Problem Relation Age of Onset   Breast cancer Daughter    Breast cancer Maternal Grandfather     ALLERGIES:  is allergic to amlodipine , atorvastatin, lisinopril, meloxicam, and aleve [naproxen].  MEDICATIONS:  Current Outpatient Medications  Medication Sig Dispense Refill   acetaminophen   (TYLENOL ) 650 MG CR tablet Take 1,300 mg by mouth every 8 (eight) hours as needed for pain.     amLODipine  (NORVASC ) 5 MG tablet Take 5 mg by mouth in the morning.     anagrelide  (AGRYLIN) 0.5 MG capsule Take 1 capsule (0.5 mg total) by mouth 2 (two) times daily. 60 capsule 3   aspirin  EC 81 MG tablet Take 81 mg by mouth daily. Swallow whole.     Carboxymethylcellulose Sodium (REFRESH LIQUIGEL) 1 % GEL Place 1 drop into both eyes at bedtime.     FOLIC ACID  PO Take 1 mg by mouth in the morning.     furosemide (LASIX) 40 MG tablet Take 1 tablet (40 mg total) by mouth daily. 30 tablet 0   hydroxychloroquine  (PLAQUENIL ) 200 MG tablet TAKE 1 TABLET BY MOUTH TWICE  DAILY (Patient taking differently: Take 200 mg by mouth daily.) 180 tablet 3   levothyroxine  (SYNTHROID , LEVOTHROID) 25 MCG tablet Take 25 mcg by mouth daily before breakfast.      methotrexate  (RHEUMATREX) 2.5 MG tablet Take 12.5 mg by mouth every Friday.      Multiple Minerals-Vitamins (CALCIUM-MAGNESIUM-ZINC-D3 PO) Take 2 tablets by mouth in the morning.     Multiple Vitamin (MULTIVITAMIN WITH MINERALS) TABS tablet Take 1 tablet by mouth in the morning. One A Day Women's Multivitamin     olmesartan (BENICAR) 20 MG tablet Take 1 tablet (20 mg total) by mouth daily.     omeprazole (PRILOSEC) 40 MG capsule Take 40 mg by mouth in the morning.     PARoxetine  (PAXIL ) 20 MG tablet Take 20 mg by mouth daily.     polyethylene glycol (MIRALAX / GLYCOLAX) packet Take 17 g by mouth 2 (two) times a week.     Polyvinyl Alcohol -Povidone PF (REFRESH) 1.4-0.6 % SOLN Place 1 drop into both eyes daily.     Probiotic Product (PROBIOTIC DAILY PO) Take 1 capsule by mouth in the morning.     simvastatin  (ZOCOR ) 40 MG tablet Take 40 mg by mouth daily.     No current facility-administered medications for this visit.    REVIEW OF SYSTEMS:   Constitutional: ( - ) fevers, ( - )  chills , ( - ) night sweats Eyes: ( - ) blurriness of vision, ( - ) double vision,  ( - ) watery eyes Ears, nose, mouth, throat, and face: ( - ) mucositis, ( - ) sore throat Respiratory: ( - ) cough, ( - ) dyspnea, ( - ) wheezes Cardiovascular: ( - ) palpitation, ( - ) chest discomfort, ( - ) lower extremity swelling Gastrointestinal:  ( - ) nausea, ( - ) heartburn, ( - ) change in bowel habits Skin: ( - ) abnormal skin rashes Lymphatics: ( - ) new lymphadenopathy, ( - ) easy bruising Neurological: ( - ) numbness, ( - ) tingling, ( - ) new weaknesses Behavioral/Psych: ( - ) mood change, ( - ) new changes  All other systems were reviewed with the patient and are negative.  PHYSICAL EXAMINATION:  Vitals:   11/03/24 1048  BP: (!) 124/59  Pulse: 73  Resp: 14  Temp: 97.9 F (36.6 C)  SpO2: 99%     Filed Weights   11/03/24 1048  Weight: 168 lb 11.2 oz (76.5 kg)      GENERAL: Well-appearing elderly Caucasian female, alert, no distress and comfortable SKIN: skin color, texture, turgor are normal, no rashes or significant lesions EYES: conjunctiva are pink and non-injected, sclera clear LUNGS: clear to auscultation and percussion with normal breathing effort HEART: regular rate & rhythm and no murmurs and no lower extremity edema Musculoskeletal: no cyanosis of digits and no clubbing  PSYCH: alert & oriented x 3, fluent speech NEURO: no focal motor/sensory deficits  LABORATORY DATA:  I have reviewed the data as listed    Latest Ref Rng & Units 11/03/2024    9:33 AM 11/03/2024    9:00 AM 10/13/2024    2:29 AM  CBC  WBC 4.0 - 10.5 K/uL 9.9  10.3  10.9   Hemoglobin 12.0 - 15.0 g/dL 9.8  89.9  9.6   Hematocrit 36.0 - 46.0 % 28.8  30.3  28.5   Platelets 150 - 400 K/uL 286  321  297        Latest Ref Rng & Units  11/03/2024    9:33 AM 11/03/2024    9:00 AM 10/14/2024    2:49 AM  CMP  Glucose 70 - 99 mg/dL 898  98  890   BUN 8 - 23 mg/dL 41  38  25   Creatinine 0.44 - 1.00 mg/dL 8.29  8.28  8.85   Sodium 135 - 145 mmol/L 132  133  132   Potassium 3.5 - 5.1  mmol/L 5.0  4.5  3.7   Chloride 98 - 111 mmol/L 95  95  96   CO2 22 - 32 mmol/L 29  28  24    Calcium 8.9 - 10.3 mg/dL 9.4  9.0  8.2   Total Protein 6.5 - 8.1 g/dL 7.1  6.8    Total Bilirubin 0.0 - 1.2 mg/dL 0.3  0.4    Alkaline Phos 38 - 126 U/L 61  66    AST 15 - 41 U/L 21  25    ALT 0 - 44 U/L 15  18       RADIOGRAPHIC STUDIES: DG Chest 2 View Result Date: 11/04/2024 CLINICAL DATA:  Severe aortic stenosis.  Preop exam. EXAM: DG CHEST 2V COMPARISON:  Radiograph 10/11/2024, CT 10/23/2024 FINDINGS: The cardiomediastinal contours are stable. Aortic atherosclerosis. Chronic elevation of right hemidiaphragm. Resolved pulmonary edema. Pulmonary vasculature is normal. No consolidation, pleural effusion, or pneumothorax. No acute osseous abnormalities are seen. IMPRESSION: 1. No acute chest findings. 2. Chronic elevation of right hemidiaphragm. Electronically Signed   By: Andrea Gasman M.D.   On: 11/04/2024 18:55   CT CORONARY MORPH W/CTA COR W/SCORE W/CA W/CM &/OR WO/CM Addendum Date: 10/23/2024 ADDENDUM REPORT: 10/23/2024 22:37 EXAM: OVER-READ INTERPRETATION  CT CHEST The following report is an over-read performed by radiologist Dr. Luke Siad [facility] on 10/23/2024. This over-read does not include interpretation of cardiac or coronary anatomy or pathology. The coronary CTA interpretation by the cardiologist is attached. COMPARISON:  CT 10/23/2024 FINDINGS: Lower chest: Included lung bases are grossly clear. Aortic atherosclerosis. Upper abdomen: No acute finding Musculoskeletal: Degenerative changes.  No acute finding IMPRESSION: 1. No acute or significant extracardiac findings. 2. Aortic atherosclerosis. Aortic Atherosclerosis (ICD10-I70.0). Electronically Signed   By: Luke Bun M.D.   On: 10/23/2024 22:37   Result Date: 10/23/2024 CLINICAL DATA:  Severe Aortic Stenosis. EXAM: Cardiac TAVR CT TECHNIQUE: A non-contrast, gated CT scan was obtained with axial slices of 2.5  mm through the heart for aortic valve scoring. A 120 kV retrospective, gated, contrast cardiac scan was obtained. Gantry rotation speed was 230 msec and collimation was 0.63 mm. Nitroglycerin was not given. A delayed scan was obtained to exclude left atrial appendage thrombus. The 3D dataset was reconstructed in systole with motion correction. The 3D data set was reconstructed in 5% intervals of the 0-95% of the R-R cycle. Systolic and diastolic phases were analyzed on a dedicated workstation using MPR, MIP, and VRT modes. The patient received 100 cc of contrast. FINDINGS: Aortic Valve: Valve Morphology: Tricuspid with moderate symmetric calcification and reduced excursion with the planimeter valve area is 0.9 Sq cm consistent with severe aortic stenosis Annular and subannular calcification: None Aortic Valve Calcium Score: 1436 Perimembranous septal diameter: 5 mm Mitral Valve: Severe mitral annular calcification. Aortic Annulus Measurements- 30 % Phase Major annulus diameter: 28 mm Minor annulus diameter:21 mm Annular perimeter: 77 mm Annular area: 4.47 cm2 Aortic Measurements- 75% phase Sinotubular Junction: 28 mm Ascending Thoracic Aorta: 31 mm Descending Thoracic Aorta: 24 mm Aortic atherosclerosis. Sinus of Valsalva  Measurements: Right coronary cusp width: 28 mm Left coronary cusp width: 28 mm Non coronary cusp width: 30 mm Coronary Artery Height above Annulus: Left Main: 17 mm Left SoV height: 22 mm Right Coronary: 14 mm Right SoV height: 22 mm Optimum Fluoroscopic Angle for Delivery: LAO 8, CAU 7 Valves for structural team consideration: 26 mm Sapien 29 mm Evolut feasible with borderline average sinus measurements (diameter) Non TAVR Valve Findings: Coronary Arteries: Normal coronary origin. Study not completed with nitroglycerin. With this caveat: Mild calcified proximal RCA. Mild to moderate mixed plaque in the proximal LAD- correlation to heart catheterization is recommended. Mild to moderate mixed plaque  in the proximal LCX- correlation to heart catheterization is recommended. Coronary Calcium Score: Left main: 179 Left anterior descending artery: 157 Left circumflex artery: 375 Right coronary artery: 522 Total: 1233 Percentile: 90th for age, sex, and race matched control. Systemic veins: Normal anatomy. Main Pulmonary artery: Normal caliber. Pulmonary veins: Normal variant anatomy- multiple right middle pulmonary veins. Left atrial appendage: Patent. Interatrial septum: No PFO or ASD. Chamber dimensions: Mild RV dilation. Pericardium: No calcifications. Extra Cardiac Findings as per separate reporting. Image quality: Excellent. Noise artifact is: Limited. IMPRESSION: 1. Severe Aortic stenosis. Measurements for potential interventions as above. Stanly Leavens MD Electronically Signed: By: Stanly Leavens M.D. On: 10/23/2024 11:39   CT ANGIO CHEST AORTA W/CM & OR WO/CM Result Date: 10/23/2024 CLINICAL DATA:  Preoperative evaluation, TAVR EXAM: CT ANGIOGRAPHY CHEST, ABDOMEN AND PELVIS TECHNIQUE: Multidetector CT imaging through the chest, abdomen and pelvis was performed using the standard protocol during bolus administration of intravenous contrast. Multiplanar reconstructed images and MIPs were obtained and reviewed to evaluate the vascular anatomy. RADIATION DOSE REDUCTION: This exam was performed according to the departmental dose-optimization program which includes automated exposure control, adjustment of the mA and/or kV according to patient size and/or use of iterative reconstruction technique. CONTRAST:  100mL OMNIPAQUE IOHEXOL 350 MG/ML SOLN COMPARISON:  None Available. FINDINGS: CTA CHEST FINDINGS VASCULAR Aorta: Satisfactory opacification of the aorta. Normal contour and caliber of the thoracic aorta. No evidence of aneurysm, dissection, or other acute aortic pathology. Moderate mixed aortic atherosclerosis. Cardiovascular: No evidence of pulmonary embolism on limited non-tailored  examination. Normal heart size. Thickening and calcification of the aortic valve leaflets (series 320, image 24). Three-vessel coronary artery calcifications. No pericardial effusion. Review of the MIP images confirms the above findings. NON VASCULAR Mediastinum/Nodes: No enlarged mediastinal, hilar, or axillary lymph nodes. Thyroid  gland, trachea, and esophagus demonstrate no significant findings. Lungs/Pleura: Mild bandlike scarring of the right lung base. No pleural effusion or pneumothorax. Musculoskeletal: No chest wall abnormality. No acute osseous findings. Review of the MIP images confirms the above findings. CTA ABDOMEN AND PELVIS FINDINGS VASCULAR Normal contour and caliber of the abdominal aorta. No evidence of aneurysm, dissection, or other acute aortic pathology. Standard branching pattern of the abdominal aorta with solitary bilateral renal arteries. Severe aortic atherosclerosis of the abdominal aorta and bilateral common iliac arteries. Minimal atherosclerosis of the external and common femoral arteries. No significant iliac system tortuosity. Review of the MIP images confirms the above findings. NON-VASCULAR Hepatobiliary: No solid liver abnormality is seen. No gallstones, gallbladder wall thickening, or biliary dilatation. Pancreas: Unremarkable. No pancreatic ductal dilatation or surrounding inflammatory changes. Spleen: Normal in size without significant abnormality. Adrenals/Urinary Tract: Adrenal glands are unremarkable. Kidneys are normal, without renal calculi, solid lesion, or hydronephrosis. Bladder is unremarkable. Stomach/Bowel: Stomach is within normal limits. Appendectomy. No evidence of bowel wall thickening, distention, or  inflammatory changes. Sigmoid diverticulosis. Lymphatic: No enlarged abdominal or pelvic lymph nodes. Reproductive: No mass or other significant abnormality. Other: No abdominal wall hernia or abnormality. No ascites. Musculoskeletal: No acute osseous findings. Left  hip total arthroplasty. IMPRESSION: 1. Normal contour and caliber of the thoracic and abdominal aorta. No evidence of aneurysm, dissection, or other acute aortic pathology. Moderate mixed aortic atherosclerosis of the thoracic aorta and severe atherosclerosis of the abdominal aorta and bilateral common iliac arteries. 2. Minimal atherosclerosis of the external iliac and common femoral arteries. No significant iliac system tortuosity. No significant iliac stenosis. 3. Thickening and calcification of the aortic valve leaflets, in keeping with aortic stenosis. Please see separately provided coronary angiogram report for dynamic assessment of valvular function. 4. Coronary artery disease. 5. Sigmoid diverticulosis without evidence of acute diverticulitis. Aortic Atherosclerosis (ICD10-I70.0). Electronically Signed   By: Marolyn JONETTA Jaksch M.D.   On: 10/23/2024 16:52   CT ANGIO ABDOMEN PELVIS  W & WO CONTRAST Result Date: 10/23/2024 CLINICAL DATA:  Preoperative evaluation, TAVR EXAM: CT ANGIOGRAPHY CHEST, ABDOMEN AND PELVIS TECHNIQUE: Multidetector CT imaging through the chest, abdomen and pelvis was performed using the standard protocol during bolus administration of intravenous contrast. Multiplanar reconstructed images and MIPs were obtained and reviewed to evaluate the vascular anatomy. RADIATION DOSE REDUCTION: This exam was performed according to the departmental dose-optimization program which includes automated exposure control, adjustment of the mA and/or kV according to patient size and/or use of iterative reconstruction technique. CONTRAST:  100mL OMNIPAQUE IOHEXOL 350 MG/ML SOLN COMPARISON:  None Available. FINDINGS: CTA CHEST FINDINGS VASCULAR Aorta: Satisfactory opacification of the aorta. Normal contour and caliber of the thoracic aorta. No evidence of aneurysm, dissection, or other acute aortic pathology. Moderate mixed aortic atherosclerosis. Cardiovascular: No evidence of pulmonary embolism on limited  non-tailored examination. Normal heart size. Thickening and calcification of the aortic valve leaflets (series 320, image 24). Three-vessel coronary artery calcifications. No pericardial effusion. Review of the MIP images confirms the above findings. NON VASCULAR Mediastinum/Nodes: No enlarged mediastinal, hilar, or axillary lymph nodes. Thyroid  gland, trachea, and esophagus demonstrate no significant findings. Lungs/Pleura: Mild bandlike scarring of the right lung base. No pleural effusion or pneumothorax. Musculoskeletal: No chest wall abnormality. No acute osseous findings. Review of the MIP images confirms the above findings. CTA ABDOMEN AND PELVIS FINDINGS VASCULAR Normal contour and caliber of the abdominal aorta. No evidence of aneurysm, dissection, or other acute aortic pathology. Standard branching pattern of the abdominal aorta with solitary bilateral renal arteries. Severe aortic atherosclerosis of the abdominal aorta and bilateral common iliac arteries. Minimal atherosclerosis of the external and common femoral arteries. No significant iliac system tortuosity. Review of the MIP images confirms the above findings. NON-VASCULAR Hepatobiliary: No solid liver abnormality is seen. No gallstones, gallbladder wall thickening, or biliary dilatation. Pancreas: Unremarkable. No pancreatic ductal dilatation or surrounding inflammatory changes. Spleen: Normal in size without significant abnormality. Adrenals/Urinary Tract: Adrenal glands are unremarkable. Kidneys are normal, without renal calculi, solid lesion, or hydronephrosis. Bladder is unremarkable. Stomach/Bowel: Stomach is within normal limits. Appendectomy. No evidence of bowel wall thickening, distention, or inflammatory changes. Sigmoid diverticulosis. Lymphatic: No enlarged abdominal or pelvic lymph nodes. Reproductive: No mass or other significant abnormality. Other: No abdominal wall hernia or abnormality. No ascites. Musculoskeletal: No acute osseous  findings. Left hip total arthroplasty. IMPRESSION: 1. Normal contour and caliber of the thoracic and abdominal aorta. No evidence of aneurysm, dissection, or other acute aortic pathology. Moderate mixed aortic atherosclerosis of the thoracic aorta and severe  atherosclerosis of the abdominal aorta and bilateral common iliac arteries. 2. Minimal atherosclerosis of the external iliac and common femoral arteries. No significant iliac system tortuosity. No significant iliac stenosis. 3. Thickening and calcification of the aortic valve leaflets, in keeping with aortic stenosis. Please see separately provided coronary angiogram report for dynamic assessment of valvular function. 4. Coronary artery disease. 5. Sigmoid diverticulosis without evidence of acute diverticulitis. Aortic Atherosclerosis (ICD10-I70.0). Electronically Signed   By: Marolyn JONETTA Jaksch M.D.   On: 10/23/2024 16:52   MM 3D SCREENING MAMMOGRAM BILATERAL BREAST Result Date: 10/13/2024 CLINICAL DATA:  Screening. EXAM: DIGITAL SCREENING BILATERAL MAMMOGRAM WITH TOMOSYNTHESIS AND CAD TECHNIQUE: Bilateral screening digital craniocaudal and mediolateral oblique mammograms were obtained. Bilateral screening digital breast tomosynthesis was performed. The images were evaluated with computer-aided detection. COMPARISON:  Previous exam(s). ACR Breast Density Category b: There are scattered areas of fibroglandular density. FINDINGS: There are no findings suspicious for malignancy. IMPRESSION: No mammographic evidence of malignancy. A result letter of this screening mammogram will be mailed directly to the patient. RECOMMENDATION: Screening mammogram in one year. (Code:SM-B-01Y) BI-RADS CATEGORY  1: Negative. Electronically Signed   By: Alm Parkins M.D.   On: 10/13/2024 08:15   CARDIAC CATHETERIZATION Result Date: 10/12/2024   Prox RCA to Mid RCA lesion is 30% stenosed.   Ost Cx to Prox Cx lesion is 30% stenosed.   Prox LAD to Mid LAD lesion is 30% stenosed.  Mild non-obstructive CAD Mild elevation right heart pressures Continue workup for TAVR. Medical management of CAD.   ECHOCARDIOGRAM COMPLETE Result Date: 10/11/2024    ECHOCARDIOGRAM REPORT   Patient Name:   Rishita JONELLE ALDERTON Date of Exam: 10/11/2024 Medical Rec #:  [id number]      Height:       63.0 in Accession #:    [id number]     Weight:       183.0 lb Date of Birth:  1941-08-15      BSA:          1.862 m Patient Age:    83 years       BP:           101/90 mmHg Patient Gender: F              HR:           99 bpm. Exam Location:  Inpatient Procedure: 2D Echo, Cardiac Doppler, Color Doppler and Intracardiac            Opacification Agent (Both Spectral and Color Flow Doppler were            utilized during procedure). Indications:    CHF I50.9  History:        Patient has no prior history of Echocardiogram examinations.                 CHF, Signs/Symptoms:Murmur and Dyspnea; Risk                 Factors:Hypertension.  Sonographer:    BERNARDA ROCKS Referring Phys: AUGUSTA.BIGNESS STEVEN J NEWTON IMPRESSIONS  1. Hypokinesis of the distal septum and apex with overall low normal LV function.  2. Left ventricular ejection fraction, by estimation, is 50 to 55%. The left ventricle has low normal function. The left ventricle demonstrates regional wall motion abnormalities (see scoring diagram/findings for description). The left ventricular internal cavity size was mildly dilated. There is mild left ventricular hypertrophy of the basal-septal segment. Left ventricular diastolic parameters are indeterminate.  3. Right ventricular  systolic function is normal. The right ventricular size is normal. There is mildly elevated pulmonary artery systolic pressure.  4. Left atrial size was severely dilated.  5. The mitral valve is normal in structure. Mild mitral valve regurgitation. No evidence of mitral stenosis. Moderate mitral annular calcification.  6. The aortic valve is calcified. Aortic valve regurgitation is trivial. Severe aortic  valve stenosis.  7. The inferior vena cava is normal in size with <50% respiratory variability, suggesting right atrial pressure of 8 mmHg. Comparison(s): No prior Echocardiogram. FINDINGS  Left Ventricle: Left ventricular ejection fraction, by estimation, is 50 to 55%. The left ventricle has low normal function. The left ventricle demonstrates regional wall motion abnormalities. Definity contrast agent was given IV to delineate the left ventricular endocardial borders. The left ventricular internal cavity size was mildly dilated. There is mild left ventricular hypertrophy of the basal-septal segment. Left ventricular diastolic parameters are indeterminate. Right Ventricle: The right ventricular size is normal. Right ventricular systolic function is normal. There is mildly elevated pulmonary artery systolic pressure. The tricuspid regurgitant velocity is 3.05 m/s, and with an assumed right atrial pressure of 3 mmHg, the estimated right ventricular systolic pressure is 40.2 mmHg. Left Atrium: Left atrial size was severely dilated. Right Atrium: Right atrial size was normal in size. Pericardium: There is no evidence of pericardial effusion. Mitral Valve: The mitral valve is normal in structure. Moderate mitral annular calcification. Mild mitral valve regurgitation. No evidence of mitral valve stenosis. MV peak gradient, 11.4 mmHg. The mean mitral valve gradient is 4.0 mmHg. Tricuspid Valve: The tricuspid valve is normal in structure. Tricuspid valve regurgitation is trivial. No evidence of tricuspid stenosis. Aortic Valve: The aortic valve is calcified. Aortic valve regurgitation is trivial. Aortic regurgitation PHT measures 243 msec. Severe aortic stenosis is present. Aortic valve mean gradient measures 48.0 mmHg. Aortic valve peak gradient measures 81.0 mmHg. Aortic valve area, by VTI measures 0.44 cm. Pulmonic Valve: The pulmonic valve was normal in structure. Pulmonic valve regurgitation is not visualized. No  evidence of pulmonic stenosis. Aorta: The aortic root is normal in size and structure. Venous: The inferior vena cava is normal in size with less than 50% respiratory variability, suggesting right atrial pressure of 8 mmHg. IAS/Shunts: No atrial level shunt detected by color flow Doppler. Additional Comments: Hypokinesis of the distal septum and apex with overall low normal LV function.  LEFT VENTRICLE PLAX 2D LVIDd:         5.90 cm      Diastology LVIDs:         3.90 cm      LV e' medial:    2.50 cm/s LV PW:         0.80 cm      LV E/e' medial:  46.0 LV IVS:        0.90 cm      LV e' lateral:   6.85 cm/s LVOT diam:     2.00 cm      LV E/e' lateral: 16.8 LV SV:         50 LV SV Index:   27 LVOT Area:     3.14 cm  LV Volumes (MOD) LV vol d, MOD A2C: 179.0 ml LV vol d, MOD A4C: 180.0 ml LV vol s, MOD A2C: 83.1 ml LV vol s, MOD A4C: 81.2 ml LV SV MOD A2C:     95.9 ml LV SV MOD A4C:     180.0 ml LV SV MOD BP:  97.5 ml RIGHT VENTRICLE             IVC RV Basal diam:  3.20 cm     IVC diam: 1.70 cm RV S prime:     17.90 cm/s TAPSE (M-mode): 2.0 cm RVSP:           45.2 mmHg LEFT ATRIUM             Index        RIGHT ATRIUM           Index LA diam:        4.40 cm 2.36 cm/m   RA Pressure: 8.00 mmHg LA Vol (A2C):   98.5 ml 52.90 ml/m  RA Area:     10.40 cm LA Vol (A4C):   85.8 ml 46.08 ml/m  RA Volume:   22.80 ml  12.25 ml/m LA Biplane Vol: 99.2 ml 53.28 ml/m  AORTIC VALVE                     PULMONIC VALVE AV Area (Vmax):    0.51 cm      PV Vmax:          1.02 m/s AV Area (Vmean):   0.42 cm      PV Peak grad:     4.2 mmHg AV Area (VTI):     0.44 cm      PR End Diast Vel: 1.08 msec AV Vmax:           450.00 cm/s AV Vmean:          331.000 cm/s AV VTI:            1.140 m AV Peak Grad:      81.0 mmHg AV Mean Grad:      48.0 mmHg LVOT Vmax:         72.70 cm/s LVOT Vmean:        44.400 cm/s LVOT VTI:          0.158 m LVOT/AV VTI ratio: 0.14 AI PHT:            243 msec  AORTA Ao Root diam: 2.80 cm Ao Asc diam:  3.10 cm  MITRAL VALVE                TRICUSPID VALVE MV Area (PHT): 5.42 cm     TR Peak grad:   37.2 mmHg MV Area VTI:   2.48 cm     TR Vmax:        305.00 cm/s MV Peak grad:  11.4 mmHg    Estimated RAP:  8.00 mmHg MV Mean grad:  4.0 mmHg     RVSP:           45.2 mmHg MV Vmax:       1.69 m/s MV Vmean:      90.3 cm/s    SHUNTS MV Decel Time: 140 msec     Systemic VTI:  0.16 m MR Peak grad: 146.4 mmHg    Systemic Diam: 2.00 cm MR Mean grad: 89.0 mmHg MR Vmax:      605.00 cm/s MR Vmean:     438.0 cm/s MV E velocity: 115.00 cm/s MV A velocity: 103.00 cm/s MV E/A ratio:  1.12 Redell Shallow MD Electronically signed by Redell Shallow MD Signature Date/Time: 10/11/2024/4:06:58 PM    Final    DG Chest Port 1 View Result Date: 10/11/2024 EXAM: 1 VIEW(S) XRAY OF THE CHEST 10/11/2024 06:36:14 AM COMPARISON: PA and lateral  radiographs of the chest dated 03/10/2023. CLINICAL HISTORY: sob. Sob; rover. FINDINGS: LUNGS AND PLEURA: Diffuse interstitial prominence. Possible trace right pleural effusion. No focal pulmonary opacity. No pulmonary edema. No pneumothorax. HEART AND MEDIASTINUM: Heart size is at the upper limits of normal. Aortic atherosclerosis. No other acute abnormality of the cardiac and mediastinal silhouettes. BONES AND SOFT TISSUES: No acute osseous abnormality. IMPRESSION: 1. Diffuse interstitial prominence/edema and possible trace right pleural effusion. 2. Heart size at upper limits of normal. Aortic atherosclerosis. Electronically signed by: Evalene Coho MD 10/11/2024 07:00 AM EDT RP Workstation: [id number]    ASSESSMENT & PLAN ADELYN ROSCHER is a 83 y.o. female with medical history significant for newly diagnosed essential thrombocytosis who presents for a follow up visit.  # Essential thrombocytosis, CALR Positive --Diagnosis confirmed by the presence of a CALR mutation as well as bone marrow biopsy showing abnormal megakaryocytes. -- Target platelets between 150 and 400.  --Labs today show WBC 9.9,  hemoglobin 9.8, MCV 91.7, platelets 286, creatinine and LFTs are normal ----continue anagrelide  0.5 mg twice daily. Refill sent today.  -- Return to clinic in 4 weeks for labs, 8 weeks for clinic visit   No orders of the defined types were placed in this encounter.   All questions were answered. The patient knows to call the clinic with any problems, questions or concerns.   I have spent a total of 25 minutes minutes of face-to-face and non-face-to-face time, preparing to see the patient,  performing a medically appropriate examination, counseling and educating the patient, documenting clinical information in the electronic health record, independently interpreting results and communicating results to the patient, and care coordination.   Norleen IVAR Kidney, MD Department of Hematology/Oncology [facility] at [facility] Phone: [phone] Pager: [phone] Email: [email or web address]    11/05/2024 3:48 PM

## 2024-11-03 ENCOUNTER — Other Ambulatory Visit: Payer: Self-pay | Admitting: Physician Assistant

## 2024-11-03 ENCOUNTER — Other Ambulatory Visit: Payer: Self-pay

## 2024-11-03 ENCOUNTER — Other Ambulatory Visit (HOSPITAL_COMMUNITY)

## 2024-11-03 ENCOUNTER — Inpatient Hospital Stay: Admitting: Hematology and Oncology

## 2024-11-03 ENCOUNTER — Telehealth: Payer: Self-pay | Admitting: Physician Assistant

## 2024-11-03 ENCOUNTER — Encounter (HOSPITAL_COMMUNITY)
Admission: RE | Admit: 2024-11-03 | Discharge: 2024-11-03 | Disposition: A | Discharge status: Home / Self Care | Source: Ambulatory Visit | Attending: Internal Medicine | Admitting: Internal Medicine

## 2024-11-03 ENCOUNTER — Ambulatory Visit (HOSPITAL_COMMUNITY)
Admission: RE | Admit: 2024-11-03 | Discharge: 2024-11-03 | Disposition: A | Discharge status: Home / Self Care | Source: Ambulatory Visit | Attending: Internal Medicine | Admitting: Internal Medicine

## 2024-11-03 ENCOUNTER — Inpatient Hospital Stay: Attending: Hematology and Oncology

## 2024-11-03 VITALS — BP 124/59 | HR 73 | Temp 97.9°F | Resp 14 | Wt 168.7 lb

## 2024-11-03 DIAGNOSIS — Z01818 Encounter for other preprocedural examination: Secondary | ICD-10-CM

## 2024-11-03 DIAGNOSIS — I350 Nonrheumatic aortic (valve) stenosis: Secondary | ICD-10-CM

## 2024-11-03 DIAGNOSIS — D75839 Thrombocytosis, unspecified: Secondary | ICD-10-CM | POA: Diagnosis not present

## 2024-11-03 DIAGNOSIS — Z7982 Long term (current) use of aspirin: Secondary | ICD-10-CM | POA: Diagnosis not present

## 2024-11-03 DIAGNOSIS — Z79899 Other long term (current) drug therapy: Secondary | ICD-10-CM | POA: Diagnosis not present

## 2024-11-03 DIAGNOSIS — D473 Essential (hemorrhagic) thrombocythemia: Secondary | ICD-10-CM | POA: Diagnosis present

## 2024-11-03 DIAGNOSIS — I110 Hypertensive heart disease with heart failure: Secondary | ICD-10-CM | POA: Insufficient documentation

## 2024-11-03 LAB — CBC WITH DIFFERENTIAL (CANCER CENTER ONLY)
Abs Immature Granulocytes: 0.11 K/uL — ABNORMAL HIGH (ref 0.00–0.07)
Basophils Absolute: 0.2 K/uL — ABNORMAL HIGH (ref 0.0–0.1)
Basophils Relative: 2 %
Eosinophils Absolute: 0 K/uL (ref 0.0–0.5)
Eosinophils Relative: 0 %
HCT: 28.8 % — ABNORMAL LOW (ref 36.0–46.0)
Hemoglobin: 9.8 g/dL — ABNORMAL LOW (ref 12.0–15.0)
Immature Granulocytes: 1 %
Lymphocytes Relative: 13 %
Lymphs Abs: 1.3 K/uL (ref 0.7–4.0)
MCH: 31.2 pg (ref 26.0–34.0)
MCHC: 34 g/dL (ref 30.0–36.0)
MCV: 91.7 fL (ref 80.0–100.0)
Monocytes Absolute: 1.4 K/uL — ABNORMAL HIGH (ref 0.1–1.0)
Monocytes Relative: 15 %
Neutro Abs: 6.9 K/uL (ref 1.7–7.7)
Neutrophils Relative %: 69 %
Platelet Count: 286 K/uL (ref 150–400)
RBC: 3.14 MIL/uL — ABNORMAL LOW (ref 3.87–5.11)
RDW: 17 % — ABNORMAL HIGH (ref 11.5–15.5)
WBC Count: 9.9 K/uL (ref 4.0–10.5)
nRBC: 0 % (ref 0.0–0.2)

## 2024-11-03 LAB — IRON AND IRON BINDING CAPACITY (CC-WL,HP ONLY)
Iron: 54 ug/dL (ref 28–170)
Saturation Ratios: 19 % (ref 10.4–31.8)
TIBC: 283 ug/dL (ref 250–450)
UIBC: 229 ug/dL (ref 148–442)

## 2024-11-03 LAB — FERRITIN: Ferritin: 246 ng/mL (ref 11–307)

## 2024-11-03 LAB — COMPREHENSIVE METABOLIC PANEL WITH GFR
ALT: 18 U/L (ref 0–44)
AST: 25 U/L (ref 15–41)
Albumin: 3.8 g/dL (ref 3.5–5.0)
Alkaline Phosphatase: 66 U/L (ref 38–126)
Anion gap: 10 (ref 5–15)
BUN: 38 mg/dL — ABNORMAL HIGH (ref 8–23)
CO2: 28 mmol/L (ref 22–32)
Calcium: 9 mg/dL (ref 8.9–10.3)
Chloride: 95 mmol/L — ABNORMAL LOW (ref 98–111)
Creatinine, Ser: 1.71 mg/dL — ABNORMAL HIGH (ref 0.44–1.00)
GFR, Estimated: 29 mL/min — ABNORMAL LOW (ref >60)
Glucose, Bld: 98 mg/dL (ref 70–99)
Potassium: 4.5 mmol/L (ref 3.5–5.1)
Sodium: 133 mmol/L — ABNORMAL LOW (ref 135–145)
Total Bilirubin: 0.4 mg/dL (ref 0.0–1.2)
Total Protein: 6.8 g/dL (ref 6.5–8.1)

## 2024-11-03 LAB — CBC
HCT: 30.3 % — ABNORMAL LOW (ref 36.0–46.0)
Hemoglobin: 10 g/dL — ABNORMAL LOW (ref 12.0–15.0)
MCH: 31.4 pg (ref 26.0–34.0)
MCHC: 33 g/dL (ref 30.0–36.0)
MCV: 95.3 fL (ref 80.0–100.0)
Platelets: 321 K/uL (ref 150–400)
RBC: 3.18 MIL/uL — ABNORMAL LOW (ref 3.87–5.11)
RDW: 16.7 % — ABNORMAL HIGH (ref 11.5–15.5)
WBC: 10.3 K/uL (ref 4.0–10.5)
nRBC: 0 % (ref 0.0–0.2)

## 2024-11-03 LAB — CMP (CANCER CENTER ONLY)
ALT: 15 U/L (ref 0–44)
AST: 21 U/L (ref 15–41)
Albumin: 4.2 g/dL (ref 3.5–5.0)
Alkaline Phosphatase: 61 U/L (ref 38–126)
Anion gap: 8 (ref 5–15)
BUN: 41 mg/dL — ABNORMAL HIGH (ref 8–23)
CO2: 29 mmol/L (ref 22–32)
Calcium: 9.4 mg/dL (ref 8.9–10.3)
Chloride: 95 mmol/L — ABNORMAL LOW (ref 98–111)
Creatinine: 1.7 mg/dL — ABNORMAL HIGH (ref 0.44–1.00)
GFR, Estimated: 29 mL/min — ABNORMAL LOW (ref >60)
Glucose, Bld: 101 mg/dL — ABNORMAL HIGH (ref 70–99)
Potassium: 5 mmol/L (ref 3.5–5.1)
Sodium: 132 mmol/L — ABNORMAL LOW (ref 135–145)
Total Bilirubin: 0.3 mg/dL (ref 0.0–1.2)
Total Protein: 7.1 g/dL (ref 6.5–8.1)

## 2024-11-03 LAB — VITAMIN B12: Vitamin B-12: 1148 pg/mL — ABNORMAL HIGH (ref 180–914)

## 2024-11-03 LAB — URINALYSIS, ROUTINE W REFLEX MICROSCOPIC
Bilirubin Urine: NEGATIVE
Glucose, UA: NEGATIVE mg/dL
Hgb urine dipstick: NEGATIVE
Ketones, ur: NEGATIVE mg/dL
Leukocytes,Ua: NEGATIVE
Nitrite: NEGATIVE
Protein, ur: NEGATIVE mg/dL
Specific Gravity, Urine: 1.012 (ref 1.005–1.030)
pH: 6 (ref 5.0–8.0)

## 2024-11-03 LAB — PROTIME-INR
INR: 1.1 (ref 0.8–1.2)
Prothrombin Time: 15 s (ref 11.4–15.2)

## 2024-11-03 LAB — TYPE AND SCREEN
ABO/RH(D): A POS
Antibody Screen: NEGATIVE

## 2024-11-03 LAB — FOLATE: Folate: 11.3 ng/mL (ref >5.9)

## 2024-11-03 LAB — SURGICAL PCR SCREEN
MRSA, PCR: NEGATIVE
Staphylococcus aureus: NEGATIVE

## 2024-11-03 MED ORDER — OLMESARTAN MEDOXOMIL 20 MG PO TABS: 20.0000 mg | ORAL_TABLET | Freq: Every day | ORAL | Status: DC

## 2024-11-03 NOTE — Progress Notes (Signed)
 All consents signed by patient at PAT lab appointment. Pt was sent home with printed copy of surgical instructions and CHG soap/CHG soap instructions. All instructions reviewed with patient and questions answered.  Patients chart send to anesthesia for review. Pt denies any respiratory illness/infection in the last two months.

## 2024-11-03 NOTE — Telephone Encounter (Addendum)
  [facility]   MULTIDISCIPLINARY HEART VALVE TEAM   Pt noted to have AKI with creat up from 1.1 to 1.7.   Med list currently has her on  - olmesartan 20mg  daily (previously listed as 10mg , but I updated med list to 20mg  as this is what the pt reported taking.) - Lasix 40mg  daily  We have asked her to hold her olmesartan 20mg  daily and change Lasix from 40mg  daily to every other day.    We will recheck her labs next week when she comes in for surgery and LVEDP at the time of surgery. She will then be instructed on which meds to resume at discharge.   Lamarr Hummer PA-C  [facility]

## 2024-11-03 NOTE — Progress Notes (Signed)
 Pt was taking olmesartan 20mg  daily not 10mg  daily.

## 2024-11-06 MED ORDER — DEXMEDETOMIDINE HCL IN NACL 400 MCG/100ML IV SOLN
0.1000 ug/kg/h | INTRAVENOUS | Status: AC
Start: 2024-11-07 — End: 2024-11-08
  Administered 2024-11-07: 1 ug/kg/h via INTRAVENOUS
  Filled 2024-11-06: qty 100

## 2024-11-06 MED ORDER — CEFAZOLIN SODIUM-DEXTROSE 2-4 GM/100ML-% IV SOLN
2.0000 g | INTRAVENOUS | Status: AC
  Administered 2024-11-07: 2 g via INTRAVENOUS
  Filled 2024-11-06: qty 100

## 2024-11-06 MED ORDER — POTASSIUM CHLORIDE 2 MEQ/ML IV SOLN
80.0000 meq | INTRAVENOUS | Status: DC
  Filled 2024-11-06: qty 40

## 2024-11-06 MED ORDER — MAGNESIUM SULFATE 50 % IJ SOLN
40.0000 meq | INTRAMUSCULAR | Status: DC
  Filled 2024-11-06: qty 9.85

## 2024-11-06 MED ORDER — HEPARIN 30,000 UNITS/1000 ML (OHS) CELLSAVER SOLUTION
Status: DC
  Filled 2024-11-06: qty 1000

## 2024-11-06 MED ORDER — NOREPINEPHRINE 4 MG/250ML-% IV SOLN
0.0000 ug/min | INTRAVENOUS | Status: AC
Start: 2024-11-07 — End: 2024-11-08
  Administered 2024-11-07: 1 ug/min via INTRAVENOUS
  Filled 2024-11-06: qty 250

## 2024-11-07 ENCOUNTER — Other Ambulatory Visit: Payer: Self-pay

## 2024-11-07 ENCOUNTER — Inpatient Hospital Stay (HOSPITAL_COMMUNITY)

## 2024-11-07 ENCOUNTER — Encounter (HOSPITAL_COMMUNITY): Admission: RE | Disposition: A | Payer: Self-pay | Source: Home / Self Care | Attending: Internal Medicine

## 2024-11-07 ENCOUNTER — Encounter (HOSPITAL_COMMUNITY): Payer: Self-pay | Admitting: Internal Medicine

## 2024-11-07 ENCOUNTER — Inpatient Hospital Stay (HOSPITAL_COMMUNITY)
Admission: RE | Admit: 2024-11-07 | Discharge: 2024-11-08 | DRG: 267 | Disposition: A | Discharge status: Home / Self Care | Attending: Internal Medicine | Admitting: Internal Medicine

## 2024-11-07 ENCOUNTER — Inpatient Hospital Stay (HOSPITAL_COMMUNITY): Admitting: Certified Registered Nurse Anesthetist

## 2024-11-07 ENCOUNTER — Inpatient Hospital Stay (HOSPITAL_COMMUNITY): Admitting: Physician Assistant

## 2024-11-07 DIAGNOSIS — Z7983 Long term (current) use of bisphosphonates: Secondary | ICD-10-CM | POA: Diagnosis not present

## 2024-11-07 DIAGNOSIS — M0519 Rheumatoid lung disease with rheumatoid arthritis of multiple sites: Secondary | ICD-10-CM | POA: Diagnosis present

## 2024-11-07 DIAGNOSIS — K219 Gastro-esophageal reflux disease without esophagitis: Secondary | ICD-10-CM | POA: Diagnosis present

## 2024-11-07 DIAGNOSIS — Z96642 Presence of left artificial hip joint: Secondary | ICD-10-CM | POA: Diagnosis present

## 2024-11-07 DIAGNOSIS — I350 Nonrheumatic aortic (valve) stenosis: Secondary | ICD-10-CM

## 2024-11-07 DIAGNOSIS — I484 Atypical atrial flutter: Secondary | ICD-10-CM | POA: Diagnosis present

## 2024-11-07 DIAGNOSIS — I110 Hypertensive heart disease with heart failure: Secondary | ICD-10-CM | POA: Diagnosis not present

## 2024-11-07 DIAGNOSIS — Z79631 Long term (current) use of antimetabolite agent: Secondary | ICD-10-CM | POA: Diagnosis not present

## 2024-11-07 DIAGNOSIS — E039 Hypothyroidism, unspecified: Secondary | ICD-10-CM | POA: Diagnosis not present

## 2024-11-07 DIAGNOSIS — D649 Anemia, unspecified: Secondary | ICD-10-CM | POA: Diagnosis present

## 2024-11-07 DIAGNOSIS — Z7901 Long term (current) use of anticoagulants: Secondary | ICD-10-CM | POA: Diagnosis not present

## 2024-11-07 DIAGNOSIS — Z886 Allergy status to analgesic agent status: Secondary | ICD-10-CM | POA: Diagnosis not present

## 2024-11-07 DIAGNOSIS — Z79899 Other long term (current) drug therapy: Secondary | ICD-10-CM | POA: Diagnosis not present

## 2024-11-07 DIAGNOSIS — I2510 Atherosclerotic heart disease of native coronary artery without angina pectoris: Secondary | ICD-10-CM | POA: Diagnosis present

## 2024-11-07 DIAGNOSIS — N1831 Chronic kidney disease, stage 3a: Secondary | ICD-10-CM | POA: Diagnosis present

## 2024-11-07 DIAGNOSIS — Z803 Family history of malignant neoplasm of breast: Secondary | ICD-10-CM

## 2024-11-07 DIAGNOSIS — E669 Obesity, unspecified: Secondary | ICD-10-CM | POA: Diagnosis present

## 2024-11-07 DIAGNOSIS — Z6831 Body mass index (BMI) 31.0-31.9, adult: Secondary | ICD-10-CM | POA: Diagnosis not present

## 2024-11-07 DIAGNOSIS — I130 Hypertensive heart and chronic kidney disease with heart failure and stage 1 through stage 4 chronic kidney disease, or unspecified chronic kidney disease: Secondary | ICD-10-CM | POA: Diagnosis present

## 2024-11-07 DIAGNOSIS — Z79890 Hormone replacement therapy (postmenopausal): Secondary | ICD-10-CM | POA: Diagnosis not present

## 2024-11-07 DIAGNOSIS — I5033 Acute on chronic diastolic (congestive) heart failure: Secondary | ICD-10-CM

## 2024-11-07 DIAGNOSIS — Z006 Encounter for examination for normal comparison and control in clinical research program: Secondary | ICD-10-CM | POA: Diagnosis not present

## 2024-11-07 DIAGNOSIS — Z952 Presence of prosthetic heart valve: Secondary | ICD-10-CM | POA: Diagnosis not present

## 2024-11-07 DIAGNOSIS — Z888 Allergy status to other drugs, medicaments and biological substances status: Secondary | ICD-10-CM | POA: Diagnosis not present

## 2024-11-07 DIAGNOSIS — Z7982 Long term (current) use of aspirin: Secondary | ICD-10-CM

## 2024-11-07 DIAGNOSIS — E785 Hyperlipidemia, unspecified: Secondary | ICD-10-CM | POA: Diagnosis present

## 2024-11-07 DIAGNOSIS — I471 Supraventricular tachycardia, unspecified: Secondary | ICD-10-CM | POA: Diagnosis present

## 2024-11-07 DIAGNOSIS — D473 Essential (hemorrhagic) thrombocythemia: Secondary | ICD-10-CM | POA: Diagnosis present

## 2024-11-07 HISTORY — PX: INTRAOPERATIVE TRANSTHORACIC ECHOCARDIOGRAM: SHX6523

## 2024-11-07 LAB — BASIC METABOLIC PANEL WITH GFR
Anion gap: 14 (ref 5–15)
BUN: 27 mg/dL — ABNORMAL HIGH (ref 8–23)
CO2: 23 mmol/L (ref 22–32)
Calcium: 9.2 mg/dL (ref 8.9–10.3)
Chloride: 95 mmol/L — ABNORMAL LOW (ref 98–111)
Creatinine, Ser: 1.34 mg/dL — ABNORMAL HIGH (ref 0.44–1.00)
GFR, Estimated: 39 mL/min — ABNORMAL LOW (ref >60)
Glucose, Bld: 110 mg/dL — ABNORMAL HIGH (ref 70–99)
Potassium: 4.3 mmol/L (ref 3.5–5.1)
Sodium: 132 mmol/L — ABNORMAL LOW (ref 135–145)

## 2024-11-07 LAB — POCT I-STAT, CHEM 8
BUN: 25 mg/dL — ABNORMAL HIGH (ref 8–23)
Calcium, Ion: 1.14 mmol/L — ABNORMAL LOW (ref 1.15–1.40)
Chloride: 97 mmol/L — ABNORMAL LOW (ref 98–111)
Creatinine, Ser: 1.3 mg/dL — ABNORMAL HIGH (ref 0.44–1.00)
Glucose, Bld: 135 mg/dL — ABNORMAL HIGH (ref 70–99)
HCT: 23 % — ABNORMAL LOW (ref 36.0–46.0)
Hemoglobin: 7.8 g/dL — ABNORMAL LOW (ref 12.0–15.0)
Potassium: 4 mmol/L (ref 3.5–5.1)
Sodium: 133 mmol/L — ABNORMAL LOW (ref 135–145)
TCO2: 26 mmol/L (ref 22–32)

## 2024-11-07 LAB — ECHOCARDIOGRAM LIMITED
AR max vel: 1.62 cm2
AV Area VTI: 1.92 cm2
AV Area mean vel: 1.74 cm2
AV Mean grad: 5 mmHg
AV Peak grad: 11 mmHg
Ao pk vel: 1.66 m/s
P 1/2 time: 266 ms
S' Lateral: 3.2 cm

## 2024-11-07 LAB — POCT ACTIVATED CLOTTING TIME
Activated Clotting Time: 147 s
Activated Clotting Time: 262 s

## 2024-11-07 MED ORDER — CHLORHEXIDINE GLUCONATE 4 % EX SOLN: 60.0000 mL | Freq: Once | CUTANEOUS | Status: DC

## 2024-11-07 MED ORDER — SODIUM CHLORIDE 0.9% FLUSH: 3.0000 mL | INTRAVENOUS | Status: DC | PRN

## 2024-11-07 MED ORDER — FENTANYL CITRATE (PF) 100 MCG/2ML IJ SOLN
INTRAMUSCULAR | Status: AC
  Filled 2024-11-07: qty 2

## 2024-11-07 MED ORDER — ASPIRIN 81 MG PO CHEW
81.0000 mg | CHEWABLE_TABLET | Freq: Every day | ORAL | Status: DC
  Administered 2024-11-08: 81 mg via ORAL
  Filled 2024-11-07: qty 1

## 2024-11-07 MED ORDER — SODIUM CHLORIDE 0.9 % IV SOLN: INTRAVENOUS | Status: AC

## 2024-11-07 MED ORDER — ACETAMINOPHEN 650 MG RE SUPP
650.0000 mg | Freq: Four times a day (QID) | RECTAL | Status: DC | PRN
Start: 2024-11-07 — End: 2024-11-08

## 2024-11-07 MED ORDER — IODIXANOL 320 MG/ML IV SOLN
INTRAVENOUS | Status: DC | PRN
  Administered 2024-11-07: 80 mL

## 2024-11-07 MED ORDER — OXYCODONE HCL 5 MG PO TABS: 5.0000 mg | ORAL_TABLET | ORAL | Status: DC | PRN

## 2024-11-07 MED ORDER — FENTANYL CITRATE (PF) 100 MCG/2ML IJ SOLN
INTRAMUSCULAR | Status: DC | PRN
  Administered 2024-11-07 (×2): 25 ug via INTRAVENOUS

## 2024-11-07 MED ORDER — CHLORHEXIDINE GLUCONATE 4 % EX SOLN
30.0000 mL | CUTANEOUS | Status: DC
  Filled 2024-11-07: qty 30

## 2024-11-07 MED ORDER — NOREPINEPHRINE BITARTRATE 1 MG/ML IV SOLN
INTRAVENOUS | Status: DC | PRN
  Administered 2024-11-07 (×2): .5 mL via INTRAVENOUS

## 2024-11-07 MED ORDER — CHLORHEXIDINE GLUCONATE 0.12 % MT SOLN: 15.0000 mL | Freq: Once | OROMUCOSAL | Status: AC

## 2024-11-07 MED ORDER — ACETAMINOPHEN 325 MG PO TABS
650.0000 mg | ORAL_TABLET | Freq: Four times a day (QID) | ORAL | Status: DC | PRN
  Administered 2024-11-07: 650 mg via ORAL
  Filled 2024-11-07: qty 2

## 2024-11-07 MED ORDER — TRAMADOL HCL 50 MG PO TABS
50.0000 mg | ORAL_TABLET | ORAL | Status: DC | PRN
  Administered 2024-11-08: 50 mg via ORAL
  Filled 2024-11-07: qty 1

## 2024-11-07 MED ORDER — HEPARIN (PORCINE) IN NACL 1000-0.9 UT/500ML-% IV SOLN
INTRAVENOUS | Status: DC | PRN
Start: 2024-11-07 — End: 2024-11-07
  Administered 2024-11-07: 500 mL

## 2024-11-07 MED ORDER — MORPHINE SULFATE (PF) 2 MG/ML IV SOLN: 1.0000 mg | INTRAVENOUS | Status: DC | PRN

## 2024-11-07 MED ORDER — SODIUM CHLORIDE 0.9 % IV SOLN: 250.0000 mL | INTRAVENOUS | Status: DC | PRN

## 2024-11-07 MED ORDER — METOPROLOL TARTRATE 5 MG/5ML IV SOLN: 2.5000 mg | INTRAVENOUS | Status: DC | PRN

## 2024-11-07 MED ORDER — PROTAMINE SULFATE 10 MG/ML IV SOLN
INTRAVENOUS | Status: DC | PRN
  Administered 2024-11-07: 110 mg via INTRAVENOUS

## 2024-11-07 MED ORDER — HEPARIN (PORCINE) IN NACL 2000-0.9 UNIT/L-% IV SOLN
INTRAVENOUS | Status: DC | PRN
  Administered 2024-11-07: 1000 mL

## 2024-11-07 MED ORDER — CHLORHEXIDINE GLUCONATE 0.12 % MT SOLN
OROMUCOSAL | Status: AC
  Administered 2024-11-07: 15 mL via OROMUCOSAL
  Filled 2024-11-07: qty 15

## 2024-11-07 MED ORDER — ONDANSETRON HCL 4 MG/2ML IJ SOLN
4.0000 mg | Freq: Four times a day (QID) | INTRAMUSCULAR | Status: DC | PRN
  Administered 2024-11-08: 4 mg via INTRAVENOUS
  Filled 2024-11-07: qty 2

## 2024-11-07 MED ORDER — LIDOCAINE HCL (PF) 1 % IJ SOLN
INTRAMUSCULAR | Status: AC
Start: 2024-11-07 — End: 2024-11-07
  Filled 2024-11-07: qty 30

## 2024-11-07 MED ORDER — HEPARIN SODIUM (PORCINE) 1000 UNIT/ML IJ SOLN
INTRAMUSCULAR | Status: DC | PRN
  Administered 2024-11-07: 11000 [IU] via INTRAVENOUS

## 2024-11-07 MED ORDER — SODIUM CHLORIDE 0.9 % IV SOLN: INTRAVENOUS | Status: DC

## 2024-11-07 MED ORDER — LIDOCAINE HCL (PF) 1 % IJ SOLN
INTRAMUSCULAR | Status: DC | PRN
  Administered 2024-11-07 (×2): 5 mL via INTRADERMAL

## 2024-11-07 MED ORDER — CEFAZOLIN SODIUM-DEXTROSE 2-4 GM/100ML-% IV SOLN
2.0000 g | Freq: Three times a day (TID) | INTRAVENOUS | Status: AC
  Administered 2024-11-07 – 2024-11-08 (×2): 2 g via INTRAVENOUS
  Filled 2024-11-07 (×2): qty 100

## 2024-11-07 MED ORDER — SODIUM CHLORIDE 0.9% FLUSH
3.0000 mL | Freq: Two times a day (BID) | INTRAVENOUS | Status: DC
  Administered 2024-11-08: 3 mL via INTRAVENOUS

## 2024-11-07 MED ORDER — NITROGLYCERIN IN D5W 200-5 MCG/ML-% IV SOLN: 0.0000 ug/min | INTRAVENOUS | Status: DC

## 2024-11-07 MED ORDER — NOREPINEPHRINE BITARTRATE 1 MG/ML IV SOLN
INTRAVENOUS | Status: DC | PRN
  Administered 2024-11-07 (×4): 1 mL via INTRAVENOUS

## 2024-11-07 NOTE — Anesthesia Postprocedure Evaluation (Signed)
 Anesthesia Post Note  Patient: Rhonda Rhonda  Procedure(s) Performed: Transcatheter Aortic Valve Replacement, Transfemoral (Left) ECHOCARDIOGRAM, TRANSTHORACIC     Patient location during evaluation: PACU Anesthesia Type: MAC Level of consciousness: awake and alert Pain management: pain level controlled Vital Signs Assessment: post-procedure vital signs reviewed and stable Respiratory status: spontaneous breathing, nonlabored ventilation, respiratory function stable and patient connected to nasal cannula oxygen Cardiovascular status: stable and blood pressure returned to baseline Postop Assessment: no apparent nausea or vomiting Anesthetic complications: no   There were no known notable events for this encounter.  Last Vitals:  Vitals:   11/07/24 1545 11/07/24 1600  BP: (!) 106/41 (!) 105/45  Pulse: 63 62  Resp: 18 11  Temp:    SpO2: 94% 95%    Last Pain:  Vitals:   11/07/24 1510  TempSrc: Axillary  PainSc: 0-No pain                 Rhonda Rhonda

## 2024-11-07 NOTE — Progress Notes (Signed)
  Echocardiogram 2D Echocardiogram has been performed.  Rhonda [name] 11/07/2024, 2:15 PM

## 2024-11-07 NOTE — Anesthesia Preprocedure Evaluation (Signed)
 Anesthesia Evaluation  Patient identified by MRN, date of birth, ID band Patient awake    Reviewed: Allergy & Precautions, H&P , NPO status , Patient's Chart, lab work & pertinent test results  Airway Mallampati: II  TM Distance: >3 FB Neck ROM: Full    Dental no notable dental hx.    Pulmonary shortness of breath   Pulmonary exam normal breath sounds clear to auscultation       Cardiovascular hypertension, +CHF  Normal cardiovascular exam+ Valvular Problems/Murmurs AS  Rhythm:Regular Rate:Normal     Neuro/Psych neg Seizures PSYCHIATRIC DISORDERS Anxiety Depression    negative neurological ROS     GI/Hepatic Neg liver ROS,GERD  ,,  Endo/Other  Hypothyroidism    Renal/GU negative Renal ROS  negative genitourinary   Musculoskeletal  (+) Arthritis ,    Abdominal   Peds negative pediatric ROS (+)  Hematology  (+) Blood dyscrasia, anemia   Anesthesia Other Findings   Reproductive/Obstetrics negative OB ROS                              Anesthesia Physical Anesthesia Plan  ASA: 4  Anesthesia Plan: MAC   Post-op Pain Management:    Induction:   PONV Risk Score and Plan: 2 and Ondansetron  and TIVA  Airway Management Planned: Natural Airway and Simple Face Mask  Additional Equipment:   Intra-op Plan:   Post-operative Plan:   Informed Consent: I have reviewed the patients History and Physical, chart, labs and discussed the procedure including the risks, benefits and alternatives for the proposed anesthesia with the patient or authorized representative who has indicated his/her understanding and acceptance.       Plan Discussed with: CRNA  Anesthesia Plan Comments:          Anesthesia Quick Evaluation

## 2024-11-07 NOTE — Progress Notes (Signed)
 Pt arrived to unit from cath lab  VSS, A/O x 4,  CCMD called ,CHG given, pt oriented to unit,Will continue to monitor.   Rhonda [name] Lanae Federer, RN    11/07/24 1729  Vitals  Temp 97.7 F (36.5 C)  Temp Source Oral  BP (!) 103/59  MAP (mmHg) 73  BP Location Left Arm  BP Method Automatic  Patient Position (if appropriate) Lying  Pulse Rate Source Monitor  Level of Consciousness  Level of Consciousness Alert  Oxygen Therapy  O2 Device Room Air  Pain Assessment  Pain Scale 0-10  Pain Score 1  MEWS Score  MEWS Temp 0  MEWS Systolic 0  MEWS Pulse 0  MEWS RR 0  MEWS LOC 0  MEWS Score 0  MEWS Score Color Green

## 2024-11-07 NOTE — Op Note (Signed)
 [address] [place] [address]             [phone]       [facility]  TAVR OPERATIVE NOTE     Date of Procedure:                11/07/2024   Preoperative Diagnosis:      Severe Aortic Stenosis    Postoperative Diagnosis:    Same    Procedure:        Transcatheter Aortic Valve Replacement - Transfemoral Approach             Edwards Sapien 3 Resilia THV (size 26 mm, model # 9755RLS, serial # [id number])              Co-Surgeons:                        Linnie Rayas, MD and Lurena Red, MD Anesthesiologist:                  Erma   Echocardiographer:              Croitoru   Pre-operative Echo Findings: Severe aortic stenosis Normal left ventricular systolic function   Post-operative Echo Findings: No paravalvular leak Normal left ventricular systolic function   Left Heart Catheterization Findings: Left ventricular end-diastolic pressure of     BRIEF CLINICAL NOTE AND INDICATIONS FOR SURGERY   Patient is an 83 year old female with a history of rheumatoid arthritis on methotrexate  and Plaquenil , GERD, CKD stage IIIa, hypertension, hyperlipidemia, 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   During the course of the patient's preoperative work up they have been evaluated comprehensively by a multidisciplinary team of specialists coordinated through the Multidisciplinary Heart Valve Clinic in the [facility].  They have been demonstrated to suffer from symptomatic severe aortic stenosis as noted above. The patient has been counseled extensively as to the relative risks and benefits of all options for the treatment of severe aortic stenosis including long term medical therapy, conventional surgery for aortic valve replacement, and transcatheter  aortic valve replacement.  The patient has been independently evaluated by Dr. Rayas with CT surgery and they are felt to be at high risk for conventional surgical aortic valve replacement. The surgeon indicated the patient would be a poor candidate for conventional surgery. Based upon review of all of the patient's preoperative diagnostic tests they are felt to be candidate for transcatheter aortic valve replacement using the transfemoral approach as an alternative to high risk conventional surgery.     Following the decision to proceed with transcatheter aortic valve replacement, a discussion has been held regarding what types of management strategies would be attempted intraoperatively in the event of life-threatening complications, including whether or not the patient would be considered a candidate for the use of cardiopulmonary bypass and/or conversion to open sternotomy for attempted surgical intervention.  The patient has been advised of a variety of complications that might develop peculiar to this approach including but not limited to risks of death, stroke, paravalvular leak, aortic dissection or other major vascular complications, aortic annulus rupture, device embolization, cardiac rupture or perforation, acute myocardial infarction, arrhythmia, heart block or bradycardia requiring permanent pacemaker placement, congestive heart failure,  respiratory failure, renal failure, pneumonia, infection, other late complications related to structural valve deterioration or migration, or other complications that might ultimately cause a temporary or permanent loss of functional independence or other long term morbidity.  The patient provides full informed consent for the procedure as described and all questions were answered preoperatively.       DETAILS OF THE OPERATIVE PROCEDURE   PREPARATION:   The patient is brought to the operating room on the above mentioned date and central monitoring was  established by the anesthesia team. The patient is placed in the supine position on the operating table.  Intravenous antibiotics are administered. Conscious sedation is used.    Baseline transthoracic echocardiogram was performed. The patient's chest, abdomen, both groins, and both lower extremities are prepared and draped in a sterile manner. A time out procedure is performed.     PERIPHERAL ACCESS:   Using the modified Seldinger technique, femoral arterial and venous access were obtained with placement of a 6 Fr sheath in the right common femoral artery.  A pigtail diagnostic catheter was passed through the femoral arterial sheath under fluoroscopic guidance into the aortic root.  Aortic root angiography was performed in order to determine the optimal angiographic angle for valve deployment.   TRANSFEMORAL ACCESS:  A micropuncture kit was used to gain access to the left common femoral artery using u/s guidance. Position confirmed with angiography. Pre-closure with double ProGlide closure devices. The patient was heparinized systemically and ACT verified > 250 seconds.     A 14 Fr transfemoral E-sheath was introduced into the left common femoral artery after progressively dilating over an Amplatz superstiff wire. An AL-1 catheter was used to direct a straight-tip exchange length wire across the native aortic valve into the left ventricle. This was exchanged out for a pigtail catheter and position was confirmed in the LV apex. Simultaneous left ventricular, aortic, and left ventricular end-diastolic pressures were recorded.  The pigtail catheter was then exchanged for an Safari wire in the LV apex.  Direct LV pacing thresholds were assessed and found to be adequate.    TRANSCATHETER HEART VALVE DEPLOYMENT:  An Edwards Sapien 3 THV (size 26 mm) was prepared and crimped per manufacturer's guidelines, and the proper orientation of the valve is confirmed on the [employer] delivery system. The valve  was advanced through the introducer sheath using normal technique until in an appropriate position in the abdominal aorta beyond the sheath tip. The balloon was then retracted and using the fine-tuning wheel was centered on the valve. The valve was then advanced across the aortic arch using appropriate flexion of the catheter. The valve was carefully positioned across the aortic valve annulus. The Commander catheter was retracted using normal technique. Once final position of the valve has been confirmed by angiographic assessment, the valve is deployed while temporarily holding ventilation and during rapid ventricular pacing to maintain systolic blood pressure < 50 mmHg and pulse pressure < 10 mmHg. The balloon inflation is held for >3 seconds after reaching full deployment volume. Once the balloon has fully deflated the balloon is retracted into the ascending aorta and valve function is assessed using TTE. There is felt to be no paravalvular leak and no central aortic insufficiency.  The patient's hemodynamic recovery following valve deployment is good.  The deployment balloon and guidewire are both removed. Echo demostrated acceptable post-procedural gradients, stable mitral valve function, and no AI.    PROCEDURE COMPLETION:  The sheath was then removed and closure devices  were completed. Protamine was administered once femoral arterial repair was complete. The pigtail catheters and femoral sheaths were removed with a Mynx closure device placed in the artery and manual pressure used for venous hemostasis.     The patient tolerated the procedure well and is transported to the surgical intensive care in stable condition. There were no immediate intraoperative complications. All sponge instrument and needle counts are verified correct at completion of the operation.    No blood products were administered during the operation.   The patient received a total of 80 mL of intravenous contrast during the  procedure.

## 2024-11-07 NOTE — Op Note (Signed)
 [facility]  TAVR OPERATIVE NOTE   Date of Procedure:  11/07/2024  Preoperative Diagnosis: Severe Aortic Stenosis   Postoperative Diagnosis: Same   Procedure:   Transcatheter Aortic Valve Replacement - Transfemoral Approach  Edwards Sapien 3 Resilia THV (size 26 mm, model # 9755RLS, serial # [id number])   Co-Surgeons:  Linnie Rayas, MD and Lurena Red, MD Anesthesiologist:  Erma  Echocardiographer:  Croitoru  Pre-operative Echo Findings: Severe aortic stenosis Normal left ventricular systolic function  Post-operative Echo Findings: No paravalvular leak Normal left ventricular systolic function  Left Heart Catheterization Findings: Left ventricular end-diastolic pressure of   BRIEF CLINICAL NOTE AND INDICATIONS FOR SURGERY  Patient is an 83 year old female with a history of rheumatoid arthritis on methotrexate  and Plaquenil , GERD, CKD stage IIIa, hypertension, hyperlipidemia, 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  During the course of the patient's preoperative work up they have been evaluated comprehensively by a multidisciplinary team of specialists coordinated through the Multidisciplinary [facility]ve Clinic in the [facility].  They have been demonstrated to suffer from symptomatic severe aortic stenosis as noted above. The patient has been counseled extensively as to the relative risks and benefits of all options for the treatment of severe aortic stenosis including long term medical therapy, conventional surgery for aortic valve replacement, and transcatheter aortic valve replacement.  The patient has been independently evaluated by Dr. Rayas with CT surgery and they are felt to be at high risk for conventional surgical aortic valve replacement. The surgeon  indicated the patient would be a poor candidate for conventional surgery. Based upon review of all of the patient's preoperative diagnostic tests they are felt to be candidate for transcatheter aortic valve replacement using the transfemoral approach as an alternative to high risk conventional surgery.    Following the decision to proceed with transcatheter aortic valve replacement, a discussion has been held regarding what types of management strategies would be attempted intraoperatively in the event of life-threatening complications, including whether or not the patient would be considered a candidate for the use of cardiopulmonary bypass and/or conversion to open sternotomy for attempted surgical intervention.  The patient has been advised of a variety of complications that might develop peculiar to this approach including but not limited to risks of death, stroke, paravalvular leak, aortic dissection or other major vascular complications, aortic annulus rupture, device embolization, cardiac rupture or perforation, acute myocardial infarction, arrhythmia, heart block or bradycardia requiring permanent pacemaker placement, congestive heart failure, respiratory failure, renal failure, pneumonia, infection, other late complications related to structural valve deterioration or migration, or other complications that might ultimately cause a temporary or permanent loss of functional independence or other long term morbidity.  The patient provides full informed consent for the procedure as described and all questions were answered preoperatively.    DETAILS OF THE OPERATIVE PROCEDURE  PREPARATION:   The patient is brought to the operating room on the above mentioned date and central monitoring was established by the anesthesia team. The patient is placed in the supine position on the operating table.  Intravenous antibiotics are administered. Conscious sedation is used.   Baseline transthoracic  echocardiogram was performed. The patient's chest, abdomen, both groins, and both lower extremities are prepared and draped in a sterile manner. A time out procedure is performed.   PERIPHERAL ACCESS:   Using  the modified Seldinger technique, femoral arterial and venous access were obtained with placement of a 6 Fr sheath in the right common femoral artery.  A pigtail diagnostic catheter was passed through the femoral arterial sheath under fluoroscopic guidance into the aortic root.  Aortic root angiography was performed in order to determine the optimal angiographic angle for valve deployment.  TRANSFEMORAL ACCESS:  A micropuncture kit was used to gain access to the left common femoral artery using u/s guidance. Position confirmed with angiography. Pre-closure with double ProGlide closure devices. The patient was heparinized systemically and ACT verified > 250 seconds.    A 14 Fr transfemoral E-sheath was introduced into the left common femoral artery after progressively dilating over an Amplatz superstiff wire. An AL-1 catheter was used to direct a straight-tip exchange length wire across the native aortic valve into the left ventricle. This was exchanged out for a pigtail catheter and position was confirmed in the LV apex. Simultaneous left ventricular, aortic, and left ventricular end-diastolic pressures were recorded.  The pigtail catheter was then exchanged for an Safari wire in the LV apex.  Direct LV pacing thresholds were assessed and found to be adequate.   TRANSCATHETER HEART VALVE DEPLOYMENT:  An Edwards Sapien 3 THV (size 26 mm) was prepared and crimped per manufacturer's guidelines, and the proper orientation of the valve is confirmed on the [employer] delivery system. The valve was advanced through the introducer sheath using normal technique until in an appropriate position in the abdominal aorta beyond the sheath tip. The balloon was then retracted and using the fine-tuning wheel  was centered on the valve. The valve was then advanced across the aortic arch using appropriate flexion of the catheter. The valve was carefully positioned across the aortic valve annulus. The Commander catheter was retracted using normal technique. Once final position of the valve has been confirmed by angiographic assessment, the valve is deployed while temporarily holding ventilation and during rapid ventricular pacing to maintain systolic blood pressure < 50 mmHg and pulse pressure < 10 mmHg. The balloon inflation is held for >3 seconds after reaching full deployment volume. Once the balloon has fully deflated the balloon is retracted into the ascending aorta and valve function is assessed using TTE. There is felt to be no paravalvular leak and no central aortic insufficiency.  The patient's hemodynamic recovery following valve deployment is good.  The deployment balloon and guidewire are both removed. Echo demostrated acceptable post-procedural gradients, stable mitral valve function, and no AI.   PROCEDURE COMPLETION:  The sheath was then removed and closure devices were completed. Protamine was administered once femoral arterial repair was complete. The pigtail catheters and femoral sheaths were removed with a Mynx closure device placed in the artery and manual pressure used for venous hemostasis.    The patient tolerated the procedure well and is transported to the surgical intensive care in stable condition. There were no immediate intraoperative complications. All sponge instrument and needle counts are verified correct at completion of the operation.   No blood products were administered during the operation.  The patient received a total of 80 mL of intravenous contrast during the procedure.  Ngan Qualls K Ameir Faria MD 11/07/2024 2:47 PM

## 2024-11-07 NOTE — Transfer of Care (Signed)
 Immediate Anesthesia Transfer of Care Note  Patient: Rhonda Rhonda  Procedure(s) Performed: Transcatheter Aortic Valve Replacement, Transfemoral (Left) ECHOCARDIOGRAM, TRANSTHORACIC  Patient Location: Cath Lab  Anesthesia Type:MAC  Level of Consciousness: awake, alert , and oriented  Airway & Oxygen Therapy: Patient Spontanous Breathing  Post-op Assessment: Report given to RN, Post -op Vital signs reviewed and stable, Patient moving all extremities X 4, and Patient able to stick tongue midline  Post vital signs: Reviewed and stable  Last Vitals:  Vitals Value Taken Time  BP 111/42 11/07/24 14:40  Temp 98.6   Pulse 73 11/07/24 14:42  Resp 17 11/07/24 14:42  SpO2 94 % 11/07/24 14:42  Vitals shown include unfiled device data.  Last Pain:  Vitals:   11/07/24 0929  TempSrc:   PainSc: 0-No pain         Complications: There were no known notable events for this encounter.

## 2024-11-07 NOTE — Interval H&P Note (Signed)
 History and Physical Interval Note:  11/07/2024 9:38 AM  [name]  has presented today for surgery, with the diagnosis of Severe Aortic Stenosis.  The various methods of treatment have been discussed with the patient and family. After consideration of risks, benefits and other options for treatment, the patient has consented to  Procedure(s): Transcatheter Aortic Valve Replacement, Transfemoral (Left) ECHOCARDIOGRAM, TRANSTHORACIC (N/A) as a surgical intervention.  The patient's history has been reviewed, patient examined, no change in status, stable for surgery.  I have reviewed the patient's chart and labs.  Questions were answered to the patient's satisfaction.     Linzie Criss MALVA Rayas

## 2024-11-08 ENCOUNTER — Encounter (HOSPITAL_COMMUNITY): Payer: Self-pay | Admitting: Internal Medicine

## 2024-11-08 ENCOUNTER — Inpatient Hospital Stay (HOSPITAL_COMMUNITY)

## 2024-11-08 ENCOUNTER — Telehealth (HOSPITAL_COMMUNITY): Payer: Self-pay

## 2024-11-08 ENCOUNTER — Other Ambulatory Visit (HOSPITAL_COMMUNITY): Payer: Self-pay

## 2024-11-08 DIAGNOSIS — Z952 Presence of prosthetic heart valve: Secondary | ICD-10-CM | POA: Diagnosis not present

## 2024-11-08 DIAGNOSIS — I350 Nonrheumatic aortic (valve) stenosis: Secondary | ICD-10-CM | POA: Diagnosis not present

## 2024-11-08 LAB — ECHOCARDIOGRAM COMPLETE
AR max vel: 2.76 cm2
AV Area VTI: 2.74 cm2
AV Area mean vel: 2.69 cm2
AV Mean grad: 6.8 mmHg
AV Peak grad: 11.1 mmHg
Ao pk vel: 1.67 m/s
Height: 62 in
S' Lateral: 2.9 cm
Weight: 2758.4 [oz_av]

## 2024-11-08 LAB — BASIC METABOLIC PANEL WITH GFR
Anion gap: 10 (ref 5–15)
BUN: 22 mg/dL (ref 8–23)
CO2: 27 mmol/L (ref 22–32)
Calcium: 8.4 mg/dL — ABNORMAL LOW (ref 8.9–10.3)
Chloride: 99 mmol/L (ref 98–111)
Creatinine, Ser: 1.25 mg/dL — ABNORMAL HIGH (ref 0.44–1.00)
GFR, Estimated: 43 mL/min — ABNORMAL LOW (ref >60)
Glucose, Bld: 112 mg/dL — ABNORMAL HIGH (ref 70–99)
Potassium: 4 mmol/L (ref 3.5–5.1)
Sodium: 136 mmol/L (ref 135–145)

## 2024-11-08 LAB — CBC
HCT: 27.9 % — ABNORMAL LOW (ref 36.0–46.0)
Hemoglobin: 9.3 g/dL — ABNORMAL LOW (ref 12.0–15.0)
MCH: 31.3 pg (ref 26.0–34.0)
MCHC: 33.3 g/dL (ref 30.0–36.0)
MCV: 93.9 fL (ref 80.0–100.0)
Platelets: 374 K/uL (ref 150–400)
RBC: 2.97 MIL/uL — ABNORMAL LOW (ref 3.87–5.11)
RDW: 16.8 % — ABNORMAL HIGH (ref 11.5–15.5)
WBC: 12.8 K/uL — ABNORMAL HIGH (ref 4.0–10.5)
nRBC: 0 % (ref 0.0–0.2)

## 2024-11-08 LAB — MAGNESIUM: Magnesium: 2 mg/dL (ref 1.7–2.4)

## 2024-11-08 MED ORDER — ORAL CARE MOUTH RINSE: 15.0000 mL | OROMUCOSAL | Status: DC | PRN

## 2024-11-08 MED ORDER — DILTIAZEM HCL ER COATED BEADS 120 MG PO CP24
120.0000 mg | ORAL_CAPSULE | Freq: Every day | ORAL | Status: DC
  Administered 2024-11-08: 120 mg via ORAL
  Filled 2024-11-08: qty 1

## 2024-11-08 MED ORDER — APIXABAN 5 MG PO TABS
5.0000 mg | ORAL_TABLET | Freq: Two times a day (BID) | ORAL | 2 refills | Status: DC
  Filled 2024-11-08: qty 60, 30d supply, fill #0

## 2024-11-08 MED ORDER — DILTIAZEM HCL ER COATED BEADS 120 MG PO CP24
120.0000 mg | ORAL_CAPSULE | Freq: Every day | ORAL | 2 refills | Status: DC
  Filled 2024-11-08 – 2024-12-01 (×4): qty 30, 30d supply, fill #0
  Filled ????-??-?? (×2): fill #0

## 2024-11-08 MED ORDER — PERFLUTREN LIPID MICROSPHERE
1.0000 mL | INTRAVENOUS | Status: AC | PRN
  Administered 2024-11-08: 2 mL via INTRAVENOUS

## 2024-11-08 NOTE — Progress Notes (Signed)
 Throughout night, pt appeared to go between Afib/Aflutter (documented rhythm post- TAVR via 12 lead EKG) and NSR. Her rate remained controlled in the 80s-90s while in bed, but with activity her HR would increase to 110s-130s sustained. Pt remained asymptomatic and rate always recovered once she returned to bed. Will convey to Day RN to update Day provider.

## 2024-11-08 NOTE — Discharge Instructions (Signed)

## 2024-11-08 NOTE — Plan of Care (Signed)

## 2024-11-08 NOTE — Discharge Summary (Addendum)
 Discharge Summary   Patient ID: Rhonda [name] MRN: [id number]; DOB: [birth year]  Admit date: 11/07/2024 Discharge date: 11/08/2024  PCP:  Elliot Charm, MD   [facility] Providers Cardiologist:  Lonni LITTIE Nanas, MD  Electrophysiologist:  Donnice DELENA Primus, MD  Structural Heart:  Lurena MARLA Red, MD   Discharge Diagnoses  Principal Problem:   Aortic stenosis   Diagnostic Studies/Procedures   TAVR OPERATIVE NOTE     Date of Procedure:                11/07/2024   Preoperative Diagnosis:      Severe Aortic Stenosis    Postoperative Diagnosis:    Same    Procedure:        Transcatheter Aortic Valve Replacement - Transfemoral Approach             Edwards Sapien 3 Resilia THV (size 26 mm, model # 9755RLS, serial # [id number])              Co-Surgeons:                        Linnie Rayas, MD and Lurena Red, MD Anesthesiologist:                  Erma   Echocardiographer:              Croitoru   Pre-operative Echo Findings: Severe aortic stenosis Normal left ventricular systolic function   Post-operative Echo Findings: No paravalvular leak Normal left ventricular systolic function   Left Heart Catheterization Findings: Left ventricular end-diastolic pressure of   Echo: 11/08/2024  IMPRESSIONS     1. Left ventricular ejection fraction, by estimation, is 55 to 60%. The  left ventricle has normal function. The left ventricle has no regional  wall motion abnormalities. There is moderate concentric left ventricular  hypertrophy of the septal segment.  Left ventricular diastolic parameters are indeterminate.   2. Right ventricular systolic function is normal. The right ventricular  size is normal. Tricuspid regurgitation signal is inadequate for assessing  PA pressure.   3. Left atrial size was moderately dilated.   4. The mitral valve is degenerative. Mild mitral valve regurgitation.  Moderate mitral annular calcification.    5. The aortic valve has been repaired/replaced. Aortic valve  regurgitation is not visualized. There is a 26 mm Edwards Sapien  prosthetic (TAVR) valve present in the aortic position. Procedure Date:  11/07/24. Echo findings are consistent with normal  structure and function of the aortic valve prosthesis. Aortic valve area,  by VTI measures 2.74 cm. Aortic valve mean gradient measures 6.8 mmHg.  Aortic valve Vmax measures 1.67 m/s.   6. The inferior vena cava is normal in size with <50% respiratory  variability, suggesting right atrial pressure of 8 mmHg.   FINDINGS   Left Ventricle: Left ventricular ejection fraction, by estimation, is 55  to 60%. The left ventricle has normal function. The left ventricle has no  regional wall motion abnormalities. Definity contrast agent was given IV  to delineate the left ventricular   endocardial borders. The left ventricular internal cavity size was normal  in size. There is moderate concentric left ventricular hypertrophy of the  septal segment. Left ventricular diastolic function could not be evaluated  due to atrial fibrillation.  Left ventricular diastolic parameters are indeterminate.   Right Ventricle: The right ventricular size is normal. No increase in  right ventricular wall thickness. Right ventricular systolic  function is  normal. Tricuspid regurgitation signal is inadequate for assessing PA  pressure.   Left Atrium: Left atrial size was moderately dilated.   Right Atrium: Right atrial size was normal in size.   Pericardium: There is no evidence of pericardial effusion. Presence of  epicardial fat layer.   Mitral Valve: The mitral valve is degenerative in appearance. Moderate  mitral annular calcification. Mild mitral valve regurgitation.   Tricuspid Valve: The tricuspid valve is normal in structure. Tricuspid  valve regurgitation is trivial.   Aortic Valve: The aortic valve has been repaired/replaced. Aortic valve   regurgitation is not visualized. Aortic valve mean gradient measures 6.8  mmHg. Aortic valve peak gradient measures 11.1 mmHg. Aortic valve area, by  VTI measures 2.74 cm. There is a  26 mm Edwards Sapien prosthetic, stented (TAVR) valve present in the  aortic position. Procedure Date: 11/07/24. Echo findings are consistent  with normal structure and function of the aortic valve prosthesis.   Pulmonic Valve: The pulmonic valve was grossly normal. Pulmonic valve  regurgitation is not visualized.   Aorta: The aortic root and ascending aorta are structurally normal, with  no evidence of dilitation.   Venous: The inferior vena cava is normal in size with less than 50%  respiratory variability, suggesting right atrial pressure of 8 mmHg.   IAS/Shunts: No atrial level shunt detected by color flow Doppler.   _____________   History of Present Illness   Rhonda [name] is a 83 y.o. female with a hx of rheumatoid arthritis on methotrexate  and Plaquenil , GERD, anemia, CKD stage IIIa, hyponatremia, HTN, HLD, obesity, and essential thrombocytosis who was evaluated by the structural heart team for critical AS. Seen by Dr. Verlin and underwent cardiac cath with minimal CAD on 10/12/2024. Evaluated by Dr. Shyrl and deemed appropriate for TAVR.    Hospital Course    Aortic stenosis -- underwent successful TAVR with 26 mm SAPIEN 3 valve via transfemoral approach.  Post op echo with stable prosthetic aortic valve with no PVL, mean gradient 6.21mmHg   Atypical atrial flutter -- EKG discussed with Dr. Almetta, who felt this was atypical flutter. Recommendations for DOAC. She is on anagrelide  outpatient risk of hemorrhage and thrombocytopenia is reported as 1-5%, therefore will plan for Eliquis 5mg  BID    Anemia -- post procedure Hgb down to 7.8, improve to 9.3 the following morning.  -- follow up CBC outpatient   Hypertension -- Blood pressure well-controlled -- switched amlodipine  to  Diltiazem 120mg  daily. Continue to hold olmesartan    Rheumatoid arthritis with lung involvement -- resume home methotrexate  and Plaquenil    Essential thrombocytosis -- On anagrelide  at home  Patient was seen by Dr. Wendel and deemed stable for discharge home.   Did the patient have an acute coronary syndrome (MI, NSTEMI, STEMI, etc) this admission?:  No                               Did the patient have a percutaneous coronary intervention (stent / angioplasty)?:  No.    _____________  Discharge Vitals Blood pressure (!) 94/59, pulse (!) 110, temperature 98.1 F (36.7 C), temperature source Oral, resp. rate 20, height 5' 2 (1.575 m), weight 78.2 kg, SpO2 98%.  Filed Weights   11/07/24 0859 11/08/24 0350  Weight: 73.5 kg 78.2 kg    Labs & Radiologic Studies  CBC Recent Labs    11/07/24 1424 11/08/24 1143  WBC  --  12.8*  HGB 7.8* 9.3*  HCT 23.0* 27.9*  MCV  --  93.9  PLT  --  374   Basic Metabolic Panel Recent Labs    88/88/74 0928 11/07/24 1424  NA 132* 133*  K 4.3 4.0  CL 95* 97*  CO2 23  --   GLUCOSE 110* 135*  BUN 27* 25*  CREATININE 1.34* 1.30*  CALCIUM 9.2  --    Liver Function Tests No results for input(s): AST, ALT, ALKPHOS, BILITOT, PROT, ALBUMIN  in the last 72 hours. No results for input(s): LIPASE, AMYLASE in the last 72 hours. High Sensitivity Troponin:   Recent Labs  Lab 10/11/24 0615 10/11/24 0833 10/11/24 0952 10/11/24 1204  TROPONINIHS 9 26* 38* 53*    No results for input(s): TRNPT in the last 720 hours.  BNP Invalid input(s): POCBNP No results for input(s): PROBNP in the last 72 hours.  No results for input(s): BNP in the last 72 hours.  D-Dimer No results for input(s): DDIMER in the last 72 hours. Hemoglobin A1C No results for input(s): HGBA1C in the last 72 hours. Fasting Lipid Panel No results for input(s): CHOL, HDL, LDLCALC, TRIG, CHOLHDL, LDLDIRECT in the last 72 hours. No  results found for: LIPOA  Thyroid  Function Tests No results for input(s): TSH, T4TOTAL, T3FREE, THYROIDAB in the last 72 hours.  Invalid input(s): FREET3 _____________  ECHOCARDIOGRAM COMPLETE Result Date: 11/08/2024    ECHOCARDIOGRAM REPORT   Patient Name:   Rhonda [name] Date of Exam: 11/08/2024 Medical Rec #:  [id number]      Height:       62.0 in Accession #:    [id number]     Weight:       172.4 lb Date of Birth:  [birth year]      BSA:          1.795 m Patient Age:    83 years       BP:           126/54 mmHg Patient Gender: F              HR:           93 bpm. Exam Location:  Inpatient Procedure: 2D Echo, Cardiac Doppler, Color Doppler and Intracardiac            Opacification Agent (Both Spectral and Color Flow Doppler were            utilized during procedure). Indications:    Post TAVR Evaluation z95.2  History:        Patient has prior history of Echocardiogram examinations, most                 recent 11/07/2024. Risk Factors:Hypertension and Dyslipidemia.                 Aortic Valve: 26 mm Edwards Sapien prosthetic, stented (TAVR)                 valve is present in the aortic position. Procedure Date:                 11/07/24.  Sonographer:    Damien Senior RDCS Referring Phys: [id number] Brylin Stanislawski K Renley Banwart IMPRESSIONS  1. Left ventricular ejection fraction, by estimation, is 55 to 60%. The left ventricle has normal function. The left ventricle has no regional wall motion abnormalities. There is moderate concentric left ventricular hypertrophy of the septal segment. Left ventricular diastolic parameters are indeterminate.  2. Right ventricular systolic function is  normal. The right ventricular size is normal. Tricuspid regurgitation signal is inadequate for assessing PA pressure.  3. Left atrial size was moderately dilated.  4. The mitral valve is degenerative. Mild mitral valve regurgitation. Moderate mitral annular calcification.  5. The aortic valve has been repaired/replaced. Aortic  valve regurgitation is not visualized. There is a 26 mm Edwards Sapien prosthetic (TAVR) valve present in the aortic position. Procedure Date: 11/07/24. Echo findings are consistent with normal structure and function of the aortic valve prosthesis. Aortic valve area, by VTI measures 2.74 cm. Aortic valve mean gradient measures 6.8 mmHg. Aortic valve Vmax measures 1.67 m/s.  6. The inferior vena cava is normal in size with <50% respiratory variability, suggesting right atrial pressure of 8 mmHg. FINDINGS  Left Ventricle: Left ventricular ejection fraction, by estimation, is 55 to 60%. The left ventricle has normal function. The left ventricle has no regional wall motion abnormalities. Definity contrast agent was given IV to delineate the left ventricular  endocardial borders. The left ventricular internal cavity size was normal in size. There is moderate concentric left ventricular hypertrophy of the septal segment. Left ventricular diastolic function could not be evaluated due to atrial fibrillation. Left ventricular diastolic parameters are indeterminate. Right Ventricle: The right ventricular size is normal. No increase in right ventricular wall thickness. Right ventricular systolic function is normal. Tricuspid regurgitation signal is inadequate for assessing PA pressure. Left Atrium: Left atrial size was moderately dilated. Right Atrium: Right atrial size was normal in size. Pericardium: There is no evidence of pericardial effusion. Presence of epicardial fat layer. Mitral Valve: The mitral valve is degenerative in appearance. Moderate mitral annular calcification. Mild mitral valve regurgitation. Tricuspid Valve: The tricuspid valve is normal in structure. Tricuspid valve regurgitation is trivial. Aortic Valve: The aortic valve has been repaired/replaced. Aortic valve regurgitation is not visualized. Aortic valve mean gradient measures 6.8 mmHg. Aortic valve peak gradient measures 11.1 mmHg. Aortic valve area,  by VTI measures 2.74 cm. There is a 26 mm Edwards Sapien prosthetic, stented (TAVR) valve present in the aortic position. Procedure Date: 11/07/24. Echo findings are consistent with normal structure and function of the aortic valve prosthesis. Pulmonic Valve: The pulmonic valve was grossly normal. Pulmonic valve regurgitation is not visualized. Aorta: The aortic root and ascending aorta are structurally normal, with no evidence of dilitation. Venous: The inferior vena cava is normal in size with less than 50% respiratory variability, suggesting right atrial pressure of 8 mmHg. IAS/Shunts: No atrial level shunt detected by color flow Doppler.  LEFT VENTRICLE PLAX 2D LVIDd:         4.00 cm LVIDs:         2.90 cm LV PW:         1.30 cm LV IVS:        1.50 cm LVOT diam:     2.20 cm LV SV:         75 LV SV Index:   42 LVOT Area:     3.80 cm  RIGHT VENTRICLE TAPSE (M-mode): 1.6 cm LEFT ATRIUM             Index        RIGHT ATRIUM          Index LA diam:        4.10 cm 2.28 cm/m   RA Area:     9.87 cm LA Vol (A2C):   80.8 ml 45.02 ml/m  RA Volume:   19.60 ml 10.92 ml/m LA Vol (A4C):  60.8 ml 33.88 ml/m LA Biplane Vol: 70.2 ml 39.11 ml/m  AORTIC VALVE AV Area (Vmax):    2.76 cm AV Area (Vmean):   2.69 cm AV Area (VTI):     2.74 cm AV Vmax:           166.75 cm/s AV Vmean:          121.900 cm/s AV VTI:            0.275 m AV Peak Grad:      11.1 mmHg AV Mean Grad:      6.8 mmHg LVOT Vmax:         121.00 cm/s LVOT Vmean:        86.250 cm/s LVOT VTI:          0.198 m LVOT/AV VTI ratio: 0.72  AORTA Ao Asc diam: 2.90 cm  SHUNTS Systemic VTI:  0.20 m Systemic Diam: 2.20 cm Morene Brownie Electronically signed by Morene Brownie Signature Date/Time: 11/08/2024/11:54:36 AM    Final    DG Chest Port 1 View Result Date: 11/07/2024 CLINICAL DATA:  Aortic stenosis EXAM: PORTABLE CHEST 1 VIEW COMPARISON:  11/03/2024 FINDINGS: Single frontal view of the chest demonstrates aortic valve prosthesis. Stable calcification of  the mitral annulus. Cardiac silhouette is unremarkable. No airspace disease, effusion, or pneumothorax. No acute bony abnormalities. IMPRESSION: 1. Aortic valve prosthesis. 2. No acute intrathoracic process. Electronically Signed   By: Ozell Daring M.D.   On: 11/07/2024 18:16   ECHOCARDIOGRAM LIMITED Result Date: 11/07/2024    ECHOCARDIOGRAM LIMITED REPORT   Patient Name:   Rhonda [name] Date of Exam: 11/07/2024 Medical Rec #:  [id number]      Height:       62.0 in Accession #:    [id number]     Weight:       162.0 lb Date of Birth:  [birth year]      BSA:          1.748 m Patient Age:    83 years       BP:           132/58 mmHg Patient Gender: F              HR:           86 bpm. Exam Location:  Inpatient Procedure: Limited Echo, Cardiac Doppler and Limited Color Doppler (Both            Spectral and Color Flow Doppler were utilized during procedure). Indications:     aortic stenosis. TAVR.  History:         Patient has prior history of Echocardiogram examinations, most                  recent 10/11/2024. CHF; Risk Factors:Hypertension and                  Dyslipidemia.                  Aortic Valve: 26 mm Edwards valve is present in the aortic                  position. Procedure Date: 11/07/24.  Sonographer:     Tinnie Barefoot RDCS Referring Phys:  [id number] LURENA MARLA RED Diagnosing Phys: Jerel Balding MD                   PREOPERATIVE FINDINGS: Normal left ventricular systolic  function and regional wall motion. Estimated left ventricular                  ejection fraction 60%.                  Trileaflet aortic valve with severe calcific stenosis.                  Peak gradient 61 mm Hg, mean gradient 40 mm Hg, dimensionless                  index 0.20, calculated aortic valve area 0.65 cm2 (indexed for                  BSA 0.37 cm2/m2).                  There is mild aortic insufficiency.                  There is mild-moderate mitral insufficiency.                  There is no  pericardial effusion.                   POSTOPERATIVE FINDINGS: Normal left ventricular systolic                  function and regional wall motion. Estimated left ventricular                  ejection fraction 60%.                  Well deployed [employer] stent-valve (TAVR).                  Peak gradient 11 mm Hg, mean gradient 5 mm Hg, dimensionless                  index, calculated aortic valve area 1.92 cm2 (indexed for BSA                  1.1 cm2/m2), acceleration time 74 ms.                  There is no aortic insufficiency/perivalvular leak.                  There is mild-moderate mitral insufficiency.                  There is no pericardial effusion. IMPRESSIONS  1. Left ventricular ejection fraction, by estimation, is 60 to 65%. The left ventricle has normal function. The left ventricle has no regional wall motion abnormalities. There is mild concentric left ventricular hypertrophy.  2. Right ventricular systolic function is normal. The right ventricular size is normal.  3. Mild to moderate mitral valve regurgitation. Moderate mitral annular calcification.  4. The aortic valve has been repaired/replaced. Aortic valve regurgitation is mild. There is a 26 mm Edwards valve present in the aortic position. Procedure Date: 11/07/24. Aortic valve mean gradient measures 5.0 mmHg. Aortic valve Vmax measures 1.66 m/s. Aortic valve acceleration time measures 74 msec. FINDINGS  Left Ventricle: Left ventricular ejection fraction, by estimation, is 60 to 65%. The left ventricle has normal function. The left ventricle has no regional wall motion abnormalities. The left ventricular internal cavity size was normal in size. There is  mild concentric left ventricular hypertrophy. Right Ventricle: The right ventricular size is normal. No increase in right ventricular  wall thickness. Right ventricular systolic function is normal. Pericardium: There is no evidence of pericardial effusion. Mitral Valve: Moderate mitral  annular calcification. Mild to moderate mitral valve regurgitation, with centrally-directed jet. Tricuspid Valve: The tricuspid valve is normal in structure. Tricuspid valve regurgitation is trivial. Aortic Valve: The aortic valve has been repaired/replaced. Aortic valve regurgitation is mild. Aortic regurgitation PHT measures 266 msec. Aortic valve mean gradient measures 5.0 mmHg. Aortic valve peak gradient measures 11.0 mmHg. Aortic valve area, by VTI measures 1.92 cm. There is a 26 mm Edwards valve present in the aortic position. Procedure Date: 11/07/24. Pulmonic Valve: The pulmonic valve was grossly normal. Pulmonic valve regurgitation is not visualized. No evidence of pulmonic stenosis. Aorta: The aortic root and ascending aorta are structurally normal, with no evidence of dilitation. Additional Comments: Spectral Doppler performed. Color Doppler performed.  LEFT VENTRICLE PLAX 2D LVIDd:         4.50 cm LVIDs:         3.20 cm LV PW:         1.00 cm LV IVS:        1.20 cm LVOT diam:     1.80 cm LV SV:         60 LV SV Index:   35 LVOT Area:     2.54 cm  AORTIC VALVE AV Area (Vmax):    1.62 cm AV Area (Vmean):   1.74 cm AV Area (VTI):     1.92 cm AV Vmax:           166.00 cm/s AV Vmean:          107.000 cm/s AV VTI:            0.314 m AV Peak Grad:      11.0 mmHg AV Mean Grad:      5.0 mmHg LVOT Vmax:         105.70 cm/s LVOT Vmean:        73.100 cm/s LVOT VTI:          0.237 m LVOT/AV VTI ratio: 0.75 AI PHT:            266 msec  AORTA Ao Root diam: 2.90 cm Ao Asc diam:  2.90 cm TRICUSPID VALVE TR Peak grad:   20.2 mmHg TR Vmax:        225.00 cm/s  SHUNTS Systemic VTI:  0.24 m Systemic Diam: 1.80 cm Jerel Balding MD Electronically signed by Jerel Balding MD Signature Date/Time: 11/07/2024/3:01:47 PM    Final    Structural Heart Procedure Result Date: 11/07/2024 See surgical note for result.  DG Chest 2 View Result Date: 11/04/2024 CLINICAL DATA:  Severe aortic stenosis.  Preop exam. EXAM: DG CHEST  2V COMPARISON:  Radiograph 10/11/2024, CT 10/23/2024 FINDINGS: The cardiomediastinal contours are stable. Aortic atherosclerosis. Chronic elevation of right hemidiaphragm. Resolved pulmonary edema. Pulmonary vasculature is normal. No consolidation, pleural effusion, or pneumothorax. No acute osseous abnormalities are seen. IMPRESSION: 1. No acute chest findings. 2. Chronic elevation of right hemidiaphragm. Electronically Signed   By: Andrea Gasman M.D.   On: 11/04/2024 18:55   CT CORONARY MORPH W/CTA COR W/SCORE W/CA W/CM &/OR WO/CM Addendum Date: 10/23/2024 ADDENDUM REPORT: 10/23/2024 22:37 EXAM: OVER-READ INTERPRETATION  CT CHEST The following report is an over-read performed by radiologist Dr. Luke Siad [facility] on 10/23/2024. This over-read does not include interpretation of cardiac or coronary anatomy or pathology. The coronary CTA interpretation by the cardiologist is attached. COMPARISON:  CT 10/23/2024  FINDINGS: Lower chest: Included lung bases are grossly clear. Aortic atherosclerosis. Upper abdomen: No acute finding Musculoskeletal: Degenerative changes.  No acute finding IMPRESSION: 1. No acute or significant extracardiac findings. 2. Aortic atherosclerosis. Aortic Atherosclerosis (ICD10-I70.0). Electronically Signed   By: Luke Bun M.D.   On: 10/23/2024 22:37   Result Date: 10/23/2024 CLINICAL DATA:  Severe Aortic Stenosis. EXAM: Cardiac TAVR CT TECHNIQUE: A non-contrast, gated CT scan was obtained with axial slices of 2.5 mm through the heart for aortic valve scoring. A 120 kV retrospective, gated, contrast cardiac scan was obtained. Gantry rotation speed was 230 msec and collimation was 0.63 mm. Nitroglycerin was not given. A delayed scan was obtained to exclude left atrial appendage thrombus. The 3D dataset was reconstructed in systole with motion correction. The 3D data set was reconstructed in 5% intervals of the 0-95% of the R-R cycle. Systolic and diastolic  phases were analyzed on a dedicated workstation using MPR, MIP, and VRT modes. The patient received 100 cc of contrast. FINDINGS: Aortic Valve: Valve Morphology: Tricuspid with moderate symmetric calcification and reduced excursion with the planimeter valve area is 0.9 Sq cm consistent with severe aortic stenosis Annular and subannular calcification: None Aortic Valve Calcium Score: 1436 Perimembranous septal diameter: 5 mm Mitral Valve: Severe mitral annular calcification. Aortic Annulus Measurements- 30 % Phase Major annulus diameter: 28 mm Minor annulus diameter:21 mm Annular perimeter: 77 mm Annular area: 4.47 cm2 Aortic Measurements- 75% phase Sinotubular Junction: 28 mm Ascending Thoracic Aorta: 31 mm Descending Thoracic Aorta: 24 mm Aortic atherosclerosis. Sinus of Valsalva Measurements: Right coronary cusp width: 28 mm Left coronary cusp width: 28 mm Non coronary cusp width: 30 mm Coronary Artery Height above Annulus: Left Main: 17 mm Left SoV height: 22 mm Right Coronary: 14 mm Right SoV height: 22 mm Optimum Fluoroscopic Angle for Delivery: LAO 8, CAU 7 Valves for structural team consideration: 26 mm Sapien 29 mm Evolut feasible with borderline average sinus measurements (diameter) Non TAVR Valve Findings: Coronary Arteries: Normal coronary origin. Study not completed with nitroglycerin. With this caveat: Mild calcified proximal RCA. Mild to moderate mixed plaque in the proximal LAD- correlation to heart catheterization is recommended. Mild to moderate mixed plaque in the proximal LCX- correlation to heart catheterization is recommended. Coronary Calcium Score: Left main: 179 Left anterior descending artery: 157 Left circumflex artery: 375 Right coronary artery: 522 Total: 1233 Percentile: 90th for age, sex, and race matched control. Systemic veins: Normal anatomy. Main Pulmonary artery: Normal caliber. Pulmonary veins: Normal variant anatomy- multiple right middle pulmonary veins. Left atrial appendage:  Patent. Interatrial septum: No PFO or ASD. Chamber dimensions: Mild RV dilation. Pericardium: No calcifications. Extra Cardiac Findings as per separate reporting. Image quality: Excellent. Noise artifact is: Limited. IMPRESSION: 1. Severe Aortic stenosis. Measurements for potential interventions as above. Stanly Leavens MD Electronically Signed: By: Stanly Leavens M.D. On: 10/23/2024 11:39   CT ANGIO CHEST AORTA W/CM & OR WO/CM Result Date: 10/23/2024 CLINICAL DATA:  Preoperative evaluation, TAVR EXAM: CT ANGIOGRAPHY CHEST, ABDOMEN AND PELVIS TECHNIQUE: Multidetector CT imaging through the chest, abdomen and pelvis was performed using the standard protocol during bolus administration of intravenous contrast. Multiplanar reconstructed images and MIPs were obtained and reviewed to evaluate the vascular anatomy. RADIATION DOSE REDUCTION: This exam was performed according to the departmental dose-optimization program which includes automated exposure control, adjustment of the mA and/or kV according to patient size and/or use of iterative reconstruction technique. CONTRAST:  100mL OMNIPAQUE IOHEXOL 350 MG/ML SOLN COMPARISON:  None Available. FINDINGS: CTA CHEST FINDINGS VASCULAR Aorta: Satisfactory opacification of the aorta. Normal contour and caliber of the thoracic aorta. No evidence of aneurysm, dissection, or other acute aortic pathology. Moderate mixed aortic atherosclerosis. Cardiovascular: No evidence of pulmonary embolism on limited non-tailored examination. Normal heart size. Thickening and calcification of the aortic valve leaflets (series 320, image 24). Three-vessel coronary artery calcifications. No pericardial effusion. Review of the MIP images confirms the above findings. NON VASCULAR Mediastinum/Nodes: No enlarged mediastinal, hilar, or axillary lymph nodes. Thyroid  gland, trachea, and esophagus demonstrate no significant findings. Lungs/Pleura: Mild bandlike scarring of the right lung  base. No pleural effusion or pneumothorax. Musculoskeletal: No chest wall abnormality. No acute osseous findings. Review of the MIP images confirms the above findings. CTA ABDOMEN AND PELVIS FINDINGS VASCULAR Normal contour and caliber of the abdominal aorta. No evidence of aneurysm, dissection, or other acute aortic pathology. Standard branching pattern of the abdominal aorta with solitary bilateral renal arteries. Severe aortic atherosclerosis of the abdominal aorta and bilateral common iliac arteries. Minimal atherosclerosis of the external and common femoral arteries. No significant iliac system tortuosity. Review of the MIP images confirms the above findings. NON-VASCULAR Hepatobiliary: No solid liver abnormality is seen. No gallstones, gallbladder wall thickening, or biliary dilatation. Pancreas: Unremarkable. No pancreatic ductal dilatation or surrounding inflammatory changes. Spleen: Normal in size without significant abnormality. Adrenals/Urinary Tract: Adrenal glands are unremarkable. Kidneys are normal, without renal calculi, solid lesion, or hydronephrosis. Bladder is unremarkable. Stomach/Bowel: Stomach is within normal limits. Appendectomy. No evidence of bowel wall thickening, distention, or inflammatory changes. Sigmoid diverticulosis. Lymphatic: No enlarged abdominal or pelvic lymph nodes. Reproductive: No mass or other significant abnormality. Other: No abdominal wall hernia or abnormality. No ascites. Musculoskeletal: No acute osseous findings. Left hip total arthroplasty. IMPRESSION: 1. Normal contour and caliber of the thoracic and abdominal aorta. No evidence of aneurysm, dissection, or other acute aortic pathology. Moderate mixed aortic atherosclerosis of the thoracic aorta and severe atherosclerosis of the abdominal aorta and bilateral common iliac arteries. 2. Minimal atherosclerosis of the external iliac and common femoral arteries. No significant iliac system tortuosity. No significant  iliac stenosis. 3. Thickening and calcification of the aortic valve leaflets, in keeping with aortic stenosis. Please see separately provided coronary angiogram report for dynamic assessment of valvular function. 4. Coronary artery disease. 5. Sigmoid diverticulosis without evidence of acute diverticulitis. Aortic Atherosclerosis (ICD10-I70.0). Electronically Signed   By: Marolyn JONETTA Jaksch M.D.   On: 10/23/2024 16:52   CT ANGIO ABDOMEN PELVIS  W & WO CONTRAST Result Date: 10/23/2024 CLINICAL DATA:  Preoperative evaluation, TAVR EXAM: CT ANGIOGRAPHY CHEST, ABDOMEN AND PELVIS TECHNIQUE: Multidetector CT imaging through the chest, abdomen and pelvis was performed using the standard protocol during bolus administration of intravenous contrast. Multiplanar reconstructed images and MIPs were obtained and reviewed to evaluate the vascular anatomy. RADIATION DOSE REDUCTION: This exam was performed according to the departmental dose-optimization program which includes automated exposure control, adjustment of the mA and/or kV according to patient size and/or use of iterative reconstruction technique. CONTRAST:  100mL OMNIPAQUE IOHEXOL 350 MG/ML SOLN COMPARISON:  None Available. FINDINGS: CTA CHEST FINDINGS VASCULAR Aorta: Satisfactory opacification of the aorta. Normal contour and caliber of the thoracic aorta. No evidence of aneurysm, dissection, or other acute aortic pathology. Moderate mixed aortic atherosclerosis. Cardiovascular: No evidence of pulmonary embolism on limited non-tailored examination. Normal heart size. Thickening and calcification of the aortic valve leaflets (series 320, image 24). Three-vessel coronary artery calcifications. No pericardial effusion. Review of the  MIP images confirms the above findings. NON VASCULAR Mediastinum/Nodes: No enlarged mediastinal, hilar, or axillary lymph nodes. Thyroid  gland, trachea, and esophagus demonstrate no significant findings. Lungs/Pleura: Mild bandlike scarring of  the right lung base. No pleural effusion or pneumothorax. Musculoskeletal: No chest wall abnormality. No acute osseous findings. Review of the MIP images confirms the above findings. CTA ABDOMEN AND PELVIS FINDINGS VASCULAR Normal contour and caliber of the abdominal aorta. No evidence of aneurysm, dissection, or other acute aortic pathology. Standard branching pattern of the abdominal aorta with solitary bilateral renal arteries. Severe aortic atherosclerosis of the abdominal aorta and bilateral common iliac arteries. Minimal atherosclerosis of the external and common femoral arteries. No significant iliac system tortuosity. Review of the MIP images confirms the above findings. NON-VASCULAR Hepatobiliary: No solid liver abnormality is seen. No gallstones, gallbladder wall thickening, or biliary dilatation. Pancreas: Unremarkable. No pancreatic ductal dilatation or surrounding inflammatory changes. Spleen: Normal in size without significant abnormality. Adrenals/Urinary Tract: Adrenal glands are unremarkable. Kidneys are normal, without renal calculi, solid lesion, or hydronephrosis. Bladder is unremarkable. Stomach/Bowel: Stomach is within normal limits. Appendectomy. No evidence of bowel wall thickening, distention, or inflammatory changes. Sigmoid diverticulosis. Lymphatic: No enlarged abdominal or pelvic lymph nodes. Reproductive: No mass or other significant abnormality. Other: No abdominal wall hernia or abnormality. No ascites. Musculoskeletal: No acute osseous findings. Left hip total arthroplasty. IMPRESSION: 1. Normal contour and caliber of the thoracic and abdominal aorta. No evidence of aneurysm, dissection, or other acute aortic pathology. Moderate mixed aortic atherosclerosis of the thoracic aorta and severe atherosclerosis of the abdominal aorta and bilateral common iliac arteries. 2. Minimal atherosclerosis of the external iliac and common femoral arteries. No significant iliac system tortuosity. No  significant iliac stenosis. 3. Thickening and calcification of the aortic valve leaflets, in keeping with aortic stenosis. Please see separately provided coronary angiogram report for dynamic assessment of valvular function. 4. Coronary artery disease. 5. Sigmoid diverticulosis without evidence of acute diverticulitis. Aortic Atherosclerosis (ICD10-I70.0). Electronically Signed   By: Marolyn JONETTA Jaksch M.D.   On: 10/23/2024 16:52   MM 3D SCREENING MAMMOGRAM BILATERAL BREAST Result Date: 10/13/2024 CLINICAL DATA:  Screening. EXAM: DIGITAL SCREENING BILATERAL MAMMOGRAM WITH TOMOSYNTHESIS AND CAD TECHNIQUE: Bilateral screening digital craniocaudal and mediolateral oblique mammograms were obtained. Bilateral screening digital breast tomosynthesis was performed. The images were evaluated with computer-aided detection. COMPARISON:  Previous exam(s). ACR Breast Density Category b: There are scattered areas of fibroglandular density. FINDINGS: There are no findings suspicious for malignancy. IMPRESSION: No mammographic evidence of malignancy. A result letter of this screening mammogram will be mailed directly to the patient. RECOMMENDATION: Screening mammogram in one year. (Code:[id number]) BI-RADS CATEGORY  1: Negative. Electronically Signed   By: Alm Parkins M.D.   On: 10/13/2024 08:15   CARDIAC CATHETERIZATION Result Date: 10/12/2024   Prox RCA to Mid RCA lesion is 30% stenosed.   Ost Cx to Prox Cx lesion is 30% stenosed.   Prox LAD to Mid LAD lesion is 30% stenosed. Mild non-obstructive CAD Mild elevation right heart pressures Continue workup for TAVR. Medical management of CAD.   ECHOCARDIOGRAM COMPLETE Result Date: 10/11/2024    ECHOCARDIOGRAM REPORT   Patient Name:   Rhonda [name] Date of Exam: 10/11/2024 Medical Rec #:  [id number]      Height:       63.0 in Accession #:    [id number]     Weight:       183.0 lb Date of Birth:  25-Sep-1941  BSA:          1.862 m Patient Age:    83 years       BP:            101/90 mmHg Patient Gender: F              HR:           99 bpm. Exam Location:  Inpatient Procedure: 2D Echo, Cardiac Doppler, Color Doppler and Intracardiac            Opacification Agent (Both Spectral and Color Flow Doppler were            utilized during procedure). Indications:    CHF I50.9  History:        Patient has no prior history of Echocardiogram examinations.                 CHF, Signs/Symptoms:Murmur and Dyspnea; Risk                 Factors:Hypertension.  Sonographer:    BERNARDA ROCKS Referring Phys: AUGUSTA.BIGNESS STEVEN J NEWTON IMPRESSIONS  1. Hypokinesis of the distal septum and apex with overall low normal LV function.  2. Left ventricular ejection fraction, by estimation, is 50 to 55%. The left ventricle has low normal function. The left ventricle demonstrates regional wall motion abnormalities (see scoring diagram/findings for description). The left ventricular internal cavity size was mildly dilated. There is mild left ventricular hypertrophy of the basal-septal segment. Left ventricular diastolic parameters are indeterminate.  3. Right ventricular systolic function is normal. The right ventricular size is normal. There is mildly elevated pulmonary artery systolic pressure.  4. Left atrial size was severely dilated.  5. The mitral valve is normal in structure. Mild mitral valve regurgitation. No evidence of mitral stenosis. Moderate mitral annular calcification.  6. The aortic valve is calcified. Aortic valve regurgitation is trivial. Severe aortic valve stenosis.  7. The inferior vena cava is normal in size with <50% respiratory variability, suggesting right atrial pressure of 8 mmHg. Comparison(s): No prior Echocardiogram. FINDINGS  Left Ventricle: Left ventricular ejection fraction, by estimation, is 50 to 55%. The left ventricle has low normal function. The left ventricle demonstrates regional wall motion abnormalities. Definity contrast agent was given IV to delineate the left ventricular  endocardial borders. The left ventricular internal cavity size was mildly dilated. There is mild left ventricular hypertrophy of the basal-septal segment. Left ventricular diastolic parameters are indeterminate. Right Ventricle: The right ventricular size is normal. Right ventricular systolic function is normal. There is mildly elevated pulmonary artery systolic pressure. The tricuspid regurgitant velocity is 3.05 m/s, and with an assumed right atrial pressure of 3 mmHg, the estimated right ventricular systolic pressure is 40.2 mmHg. Left Atrium: Left atrial size was severely dilated. Right Atrium: Right atrial size was normal in size. Pericardium: There is no evidence of pericardial effusion. Mitral Valve: The mitral valve is normal in structure. Moderate mitral annular calcification. Mild mitral valve regurgitation. No evidence of mitral valve stenosis. MV peak gradient, 11.4 mmHg. The mean mitral valve gradient is 4.0 mmHg. Tricuspid Valve: The tricuspid valve is normal in structure. Tricuspid valve regurgitation is trivial. No evidence of tricuspid stenosis. Aortic Valve: The aortic valve is calcified. Aortic valve regurgitation is trivial. Aortic regurgitation PHT measures 243 msec. Severe aortic stenosis is present. Aortic valve mean gradient measures 48.0 mmHg. Aortic valve peak gradient measures 81.0 mmHg. Aortic valve area, by VTI measures 0.44 cm. Pulmonic Valve: The pulmonic  valve was normal in structure. Pulmonic valve regurgitation is not visualized. No evidence of pulmonic stenosis. Aorta: The aortic root is normal in size and structure. Venous: The inferior vena cava is normal in size with less than 50% respiratory variability, suggesting right atrial pressure of 8 mmHg. IAS/Shunts: No atrial level shunt detected by color flow Doppler. Additional Comments: Hypokinesis of the distal septum and apex with overall low normal LV function.  LEFT VENTRICLE PLAX 2D LVIDd:         5.90 cm      Diastology  LVIDs:         3.90 cm      LV e' medial:    2.50 cm/s LV PW:         0.80 cm      LV E/e' medial:  46.0 LV IVS:        0.90 cm      LV e' lateral:   6.85 cm/s LVOT diam:     2.00 cm      LV E/e' lateral: 16.8 LV SV:         50 LV SV Index:   27 LVOT Area:     3.14 cm  LV Volumes (MOD) LV vol d, MOD A2C: 179.0 ml LV vol d, MOD A4C: 180.0 ml LV vol s, MOD A2C: 83.1 ml LV vol s, MOD A4C: 81.2 ml LV SV MOD A2C:     95.9 ml LV SV MOD A4C:     180.0 ml LV SV MOD BP:      97.5 ml RIGHT VENTRICLE             IVC RV Basal diam:  3.20 cm     IVC diam: 1.70 cm RV S prime:     17.90 cm/s TAPSE (M-mode): 2.0 cm RVSP:           45.2 mmHg LEFT ATRIUM             Index        RIGHT ATRIUM           Index LA diam:        4.40 cm 2.36 cm/m   RA Pressure: 8.00 mmHg LA Vol (A2C):   98.5 ml 52.90 ml/m  RA Area:     10.40 cm LA Vol (A4C):   85.8 ml 46.08 ml/m  RA Volume:   22.80 ml  12.25 ml/m LA Biplane Vol: 99.2 ml 53.28 ml/m  AORTIC VALVE                     PULMONIC VALVE AV Area (Vmax):    0.51 cm      PV Vmax:          1.02 m/s AV Area (Vmean):   0.42 cm      PV Peak grad:     4.2 mmHg AV Area (VTI):     0.44 cm      PR End Diast Vel: 1.08 msec AV Vmax:           450.00 cm/s AV Vmean:          331.000 cm/s AV VTI:            1.140 m AV Peak Grad:      81.0 mmHg AV Mean Grad:      48.0 mmHg LVOT Vmax:         72.70 cm/s LVOT Vmean:        44.400 cm/s  LVOT VTI:          0.158 m LVOT/AV VTI ratio: 0.14 AI PHT:            243 msec  AORTA Ao Root diam: 2.80 cm Ao Asc diam:  3.10 cm MITRAL VALVE                TRICUSPID VALVE MV Area (PHT): 5.42 cm     TR Peak grad:   37.2 mmHg MV Area VTI:   2.48 cm     TR Vmax:        305.00 cm/s MV Peak grad:  11.4 mmHg    Estimated RAP:  8.00 mmHg MV Mean grad:  4.0 mmHg     RVSP:           45.2 mmHg MV Vmax:       1.69 m/s MV Vmean:      90.3 cm/s    SHUNTS MV Decel Time: 140 msec     Systemic VTI:  0.16 m MR Peak grad: 146.4 mmHg    Systemic Diam: 2.00 cm MR Mean grad: 89.0 mmHg MR  Vmax:      605.00 cm/s MR Vmean:     438.0 cm/s MV E velocity: 115.00 cm/s MV A velocity: 103.00 cm/s MV E/A ratio:  1.12 Redell Shallow MD Electronically signed by Redell Shallow MD Signature Date/Time: 10/11/2024/4:06:58 PM    Final    DG Chest Port 1 View Result Date: 10/11/2024 EXAM: 1 VIEW(S) XRAY OF THE CHEST 10/11/2024 06:36:14 AM COMPARISON: PA and lateral radiographs of the chest dated 03/10/2023. CLINICAL HISTORY: sob. Sob; rover. FINDINGS: LUNGS AND PLEURA: Diffuse interstitial prominence. Possible trace right pleural effusion. No focal pulmonary opacity. No pulmonary edema. No pneumothorax. HEART AND MEDIASTINUM: Heart size is at the upper limits of normal. Aortic atherosclerosis. No other acute abnormality of the cardiac and mediastinal silhouettes. BONES AND SOFT TISSUES: No acute osseous abnormality. IMPRESSION: 1. Diffuse interstitial prominence/edema and possible trace right pleural effusion. 2. Heart size at upper limits of normal. Aortic atherosclerosis. Electronically signed by: Evalene Coho MD 10/11/2024 07:00 AM EDT RP Workstation: [id number]    Disposition Pt is being discharged home today in good condition.  Follow-up Plans & Appointments  Discharge Instructions     Amb Referral to Cardiac Rehabilitation   Complete by: As directed    Diagnosis: Valve Replacement   Valve: Aortic Comment - tavr   After initial evaluation and assessments completed: Virtual Based Care may be provided alone or in conjunction with Phase 2 Cardiac Rehab based on patient barriers.: Yes   Intensive Cardiac Rehabilitation (ICR) [facility] location only OR Traditional Cardiac Rehabilitation (TCR) *If criteria for ICR are not met will enroll in TCR [facility] only): Yes   Call MD for:  redness, tenderness, or signs of infection (pain, swelling, redness, odor or green/yellow discharge around incision site)   Complete by: As directed    Diet - low sodium heart healthy   Complete by: As directed    Discharge  instructions   Complete by: As directed    ACTIVITY AND EXERCISE  Daily activity and exercise are an important part of your recovery. People recover at different rates depending on their general health and type of valve procedure.  Most people recovering from TAVR feel better relatively quickly   No lifting, pushing, pulling more than 10 pounds (examples to avoid: groceries, vacuuming, gardening, golfing):             - For  one week with a procedure through the groin.             - For six weeks for procedures through the chest wall or neck. NOTE: You will typically see one of our providers 7-14 days after your procedure to discuss WHEN TO RESUME the above activities.      DRIVING  Do not drive until you are seen for follow up and cleared by a provider. Generally, we ask patient to not drive for 1 week after their procedure.  If you have been told by your doctor in the past that you may not drive, you must talk with him/her before you begin driving again.   DRESSING  Groin site: you may leave the clear dressing over the site for up to one week or until it falls off.   HYGIENE  If you had a femoral (leg) procedure, you may take a shower when you return home. After the shower, pat the site dry. Do NOT use powder, oils or lotions in your groin area until the site has completely healed.  If you had a chest procedure, you may shower when you return home unless specifically instructed not to by your discharging practitioner.             - DO NOT scrub incision; pat dry with a towel.             - DO NOT apply any lotions, oils, powders to the incision.             - No tub baths / swimming for at least 2 weeks.  If you notice any fevers, chills, increased pain, swelling, bleeding or pus, please contact your doctor.   ADDITIONAL INFORMATION  If you are going to have an upcoming dental procedure, please contact our office as you will require antibiotics ahead of time to prevent infection  on your heart valve.    If you have any questions or concerns you can call the structural heart phone during normal business hours 8am-4pm. If you have an urgent need after hours or weekends please call [phone] to talk to the on call provider for general cardiology. If you have an emergency that requires immediate attention, please call 911.    After TAVR Checklist  Check  Test Description  Follow up appointment in 1-2 weeks  You will see our structural heart physician assistant, Izetta Hummer. Your incision sites will be checked and you will be cleared to drive and resume all normal activities if you are doing well.    1 month echo and follow up  You will have an echo to check on your new heart valve and be seen back in the office by Izetta Hummer. Many times the echo is not read by your appointment time, but Izetta will call you later that day or the following day to report your results.  Follow up with your primary cardiologist You will need to be seen by your primary cardiologist in the following 3-6 months after your 1 month appointment in the valve clinic. Often times your Plavix or Aspirin  will be discontinued during this time, but this is decided on a case by case basis.   1 year echo and follow up You will have another echo to check on your heart valve after 1 year and be seen back in the office by Izetta Hummer. This your last structural heart visit.  Bacterial endocarditis prophylaxis  You will have to take antibiotics for the  rest of your life before all dental procedures (even teeth cleanings) to protect your heart valve. Antibiotics are also required before some surgeries. Please check with your cardiologist before scheduling any surgeries. Also, please make sure to tell us  if you have a penicillin allergy as you will require an alternative antibiotic.   Increase activity slowly   Complete by: As directed    No wound care   Complete by: As directed        Discharge  Medications Allergies as of 11/08/2024       Reactions   Amlodipine  Other (See Comments)   Dry mouth (patient inform that she is currently taking)   Atorvastatin Other (See Comments)   unknown reaction   Lisinopril Cough   Meloxicam Other (See Comments)    stomach upset, hives   Aleve [naproxen] Other (See Comments)   GI upset        Medication List     STOP taking these medications    amLODipine  5 MG tablet Commonly known as: NORVASC    aspirin  EC 81 MG tablet   olmesartan 20 MG tablet Commonly known as: BENICAR       TAKE these medications    acetaminophen  650 MG CR tablet Commonly known as: TYLENOL  Take 1,300 mg by mouth every 8 (eight) hours as needed for pain.   anagrelide  0.5 MG capsule Commonly known as: AGRYLIN Take 1 capsule (0.5 mg total) by mouth 2 (two) times daily.   apixaban 5 MG Tabs tablet Commonly known as: ELIQUIS Take 1 tablet (5 mg total) by mouth 2 (two) times daily.   CALCIUM-MAGNESIUM-ZINC-D3 PO Take 2 tablets by mouth in the morning.   diltiazem 120 MG 24 hr capsule Commonly known as: CARDIZEM CD Take 1 capsule (120 mg total) by mouth daily.   FOLIC ACID  PO Take 1 mg by mouth in the morning.   furosemide 40 MG tablet Commonly known as: LASIX Take 1 tablet (40 mg total) by mouth daily.   hydroxychloroquine  200 MG tablet Commonly known as: PLAQUENIL  TAKE 1 TABLET BY MOUTH TWICE  DAILY What changed: when to take this   levothyroxine  25 MCG tablet Commonly known as: SYNTHROID  Take 25 mcg by mouth daily before breakfast.   methotrexate  2.5 MG tablet Commonly known as: RHEUMATREX Take 12.5 mg by mouth every Friday.   multivitamin with minerals Tabs tablet Take 1 tablet by mouth in the morning. One A Day Women's Multivitamin   omeprazole 40 MG capsule Commonly known as: PRILOSEC Take 40 mg by mouth in the morning.   PARoxetine  20 MG tablet Commonly known as: PAXIL  Take 20 mg by mouth daily.   polyethylene glycol 17 g  packet Commonly known as: MIRALAX / GLYCOLAX Take 17 g by mouth 2 (two) times a week.   PROBIOTIC DAILY PO Take 1 capsule by mouth in the morning.   Refresh 1.4-0.6 % Soln Generic drug: Polyvinyl Alcohol -Povidone PF Place 1 drop into both eyes daily.   Refresh Liquigel 1 % Gel Generic drug: Carboxymethylcellulose Sodium Place 1 drop into both eyes at bedtime.   simvastatin  40 MG tablet Commonly known as: ZOCOR  Take 40 mg by mouth daily.         Outstanding Labs/Studies  CBC at follow up appt   Duration of Discharge Encounter: APP Time: 15 minutes   Signed, Rhonda Rummer, NP 11/08/2024, 2:53 PM   ATTENDING ATTESTATION:  After conducting a review of all available clinical information with the care team, interviewing the patient, and  performing a physical exam, I agree with the findings and plan described in this note.   GEN: No acute distress, AO x 3 HEENT:  MMM, no JVD, no scleral icterus Cardiac: RRR, no murmurs, rubs, or gallops.  Respiratory: Clear to auscultation bilaterally. GI: Soft, nontender, non-distended  MS: No edema; No deformity. 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  Her EKGs however demonstrate atypical atrial flutter.  Discussed with Dr. Almetta of the EP division.  He recommended anticoagulation.  The patient is on anagrelide  for essential thrombocythemia.  We reviewed for potential interactions with Eliquis.  While there is theoretically an increased bleeding risk there are no contraindications to therapy with Eliquis.  We cautioned the patient on the need to watch for bleeding.  Given her atrial flutter we did start long-acting diltiazem 120 mg and stop the patient's amlodipine .  We continue to hold the patient's olmesartan and can resume this at outpatient follow-up.  We will arrange for the patient to follow-up with Dr. Almetta.  Echocardiogram today was  reassuring.  Discharge today with close hospital follow-up.   APP discharge time:15 MD discharge time:20  Lurena Red, MD Pager [phone]

## 2024-11-08 NOTE — Progress Notes (Signed)
 Echocardiogram 2D Echocardiogram has been performed.  Damien FALCON Takeira Yanes RDCS 11/08/2024, 10:34 AM

## 2024-11-08 NOTE — Progress Notes (Signed)
 Pt has been ambulating. Discussed with pt and family Afib (gave Off the Beat book), restrictions, walking for exercise, and CRPII. Pt receptive, husband with appropriate questions. Will refer to [facility] CRPII.  [id number] Aliene Aris BS, ACSM-CEP 11/08/2024 11:26 AM

## 2024-11-08 NOTE — Telephone Encounter (Signed)
 Pharmacy Patient Advocate Encounter  Insurance verification completed.    The patient is insured through [facility]. Patient has Medicare and is not eligible for a copay card, but may be able to apply for patient assistance or Medicare RX Payment Plan (Patient Must reach out to their plan, if eligible for payment plan), if available.    Ran test claim for Eliquis 5mg  and the current 30 day co-pay is $47.   This test claim was processed through [facility]- copay amounts may vary at other pharmacies due to [employer], or as the patient moves through the different stages of their insurance plan.

## 2024-11-08 NOTE — Progress Notes (Addendum)
  Progress Note  Patient Name: Rhonda [name] Date of Encounter: 11/08/2024 [facility] Cardiologist: Rhonda LITTIE Nanas, Rhonda [name]   Interval Summary   POD1 status post TAVR with 26 mm SAPIEN 3 valve from a left transfemoral approach.  Patient doing well overnight.  Access sites remained stable, no signs or symptoms of stroke.    Vital Signs Vitals:   11/07/24 2025 11/07/24 2325 11/08/24 0350 11/08/24 0547  BP: (!) 100/49 (!) 117/51 (!) 102/49 (!) 113/48  Pulse: 95 100 99 95  Resp: 19 18 20 19   Temp:  98 F (36.7 C) 98.3 F (36.8 C)   TempSrc:  Oral Oral   SpO2: 96% 96% 95% 96%  Weight:   78.2 kg   Height:        Intake/Output Summary (Last 24 hours) at 11/08/2024 0648 Last data filed at 11/08/2024 0630 Gross per 24 hour  Intake 1872.56 ml  Output 260 ml  Net 1612.56 ml      11/08/2024    3:50 AM 11/07/2024    8:59 AM 11/03/2024   10:48 AM  Last 3 Weights  Weight (lbs) 172 lb 6.4 oz 162 lb 168 lb 11.2 oz  Weight (kg) 78.2 kg 73.483 kg 76.522 kg      Telemetry/ECG  Sinus rhythm, atrial fibrillation?- Personally Reviewed  Physical Exam  GEN: No acute distress.   Neck: No JVD Cardiac: RRR with ectopy, no murmurs, rubs, or gallops.  Respiratory: Clear to auscultation bilaterally. GI: Soft, nontender, non-distended; bilateral groin sites intact MS: No edema  Assessment & Plan  Aortic stenosis: Status post TAVR with 26 mm SAPIEN 3 valve.  Obtain echocardiogram today start aspirin  81 mg but may convert to anticoagulation see discussion below.  Supraventricular tachycardia: Appears to be possibly atrial fibrillation.  Will obtain EP consult for their opinion and if so we will start Eliquis instead of aspirin  as detailed above.  Obtain another EKG now  Anemia: Hemoglobin this morning is 7.8.  No need for transfusion likely due to procedural blood loss.  No signs of bleeding.  Rechecked CBC now >9.  If discharged later today we will plan on checking CBC at  follow-up next week.  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  Addendum: Discussed with Dr. Pleas of EP.  He believes the patient has atypical atrial flutter.  He recommended starting anticoagulation.  The patient is on anagrelide .  Will speak with pharmacy regarding dosing for Eliquis.  Will discontinue amlodipine  and start diltiazem CD 120mg .  Discharge today with close structural heart disease follow up and will refer to Dr. Almetta for EP follow up.         For questions or updates, please contact [facility] Please consult [email or web address] for contact info under         Signed, Rhonda Wenberg K Ziva Nunziata, Rhonda [name]

## 2024-11-08 NOTE — Progress Notes (Signed)
 Mobility Specialist Progress Note:   11/08/24 1028  Mobility  Activity Ambulated with assistance  Level of Assistance Standby assist, set-up cues, supervision of patient - no hands on  Assistive Device None  Distance Ambulated (ft) 150 ft  Activity Response Tolerated well  Mobility Referral Yes  Mobility visit 1 Mobility  Mobility Specialist Start Time (ACUTE ONLY) 1028  Mobility Specialist Stop Time (ACUTE ONLY) 1035  Mobility Specialist Time Calculation (min) (ACUTE ONLY) 7 min   Pt received in bed, agreeable to mobility session. Ambulated in hallway, no AD required. Max HR 145 bpm. Returned to room, sitting up in chair with all needs met.    Rhonda [name] Mobility Specialist Please contact via Special Educational Needs Teacher or  Rehab office at [phone]

## 2024-11-09 ENCOUNTER — Telehealth: Payer: Self-pay

## 2024-11-09 ENCOUNTER — Other Ambulatory Visit (HOSPITAL_COMMUNITY): Payer: Self-pay

## 2024-11-09 NOTE — Telephone Encounter (Signed)
 Patient contacted regarding discharge from [facility] on 11/08/24  Patient understands to follow up with provider Izetta Hummer, PA-C on 11/13/24 at 9:55AM at [address] Location. Patient understands discharge instructions? Yes Patient understands medications and regiment? Yes Patient understands to bring all medications to this visit? Yes

## 2024-11-10 ENCOUNTER — Telehealth (HOSPITAL_COMMUNITY): Payer: Self-pay

## 2024-11-10 NOTE — Telephone Encounter (Signed)
 Called patient to see if she was interested in  cardiac rehab. LVMTCB

## 2024-11-13 ENCOUNTER — Ambulatory Visit: Attending: Physician Assistant | Admitting: Physician Assistant

## 2024-11-13 ENCOUNTER — Other Ambulatory Visit (HOSPITAL_COMMUNITY): Payer: Self-pay

## 2024-11-13 VITALS — BP 122/58 | HR 92 | Ht 62.0 in | Wt 170.8 lb

## 2024-11-13 DIAGNOSIS — M069 Rheumatoid arthritis, unspecified: Secondary | ICD-10-CM

## 2024-11-13 DIAGNOSIS — D649 Anemia, unspecified: Secondary | ICD-10-CM

## 2024-11-13 DIAGNOSIS — Z952 Presence of prosthetic heart valve: Secondary | ICD-10-CM

## 2024-11-13 DIAGNOSIS — D473 Essential (hemorrhagic) thrombocythemia: Secondary | ICD-10-CM

## 2024-11-13 DIAGNOSIS — I484 Atypical atrial flutter: Secondary | ICD-10-CM

## 2024-11-13 DIAGNOSIS — I10 Essential (primary) hypertension: Secondary | ICD-10-CM

## 2024-11-13 MED ORDER — AMOXICILLIN 500 MG PO CAPS
2000.0000 mg | ORAL_CAPSULE | ORAL | 12 refills | Status: AC
  Filled 2024-11-13: qty 12, 3d supply, fill #0

## 2024-11-13 MED ORDER — APIXABAN 5 MG PO TABS
5.0000 mg | ORAL_TABLET | Freq: Two times a day (BID) | ORAL | 3 refills | Status: AC
  Filled 2024-11-13 – 2024-11-29 (×3): qty 180, 90d supply, fill #0
  Filled ????-??-??: fill #0

## 2024-11-13 NOTE — Progress Notes (Addendum)
 [facility]   MULTIDISCIPLINARY HEART VALVE CLINIC                                     Cardiology Office Note:    Date:  11/13/2024   ID:  [name], DOB 1941-08-03, MRN [id number]  PCP:  Elliot Charm, MD  [facility] Cardiologist:  Lonni LITTIE Nanas, MD  [facility] Structural heart: Lurena MARLA Red, MD [facility] Electrophysiologist:  Donnice DELENA Primus, MD   Referring MD: Elliot Charm,*   TOC s/p TAVR  History of Present Illness:    Rhonda [name] is a 83 y.o. 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   Admitted to [facility] in 09/2024 for acute CHF. Echo 10/11/24 showed EF 50-55%, with hypokinesis of the distal septum and apex, mild LV dilation, mildly elevated pulm pressure, moderate MAC with mild MR and severe aortic stenosis with a mean gradient of 44.4 mm hg, Vmax 4.5 m/s. [facility] 10/12/24 showed mild non-obstructive CAD and mild elevation right heart pressures. S/p TAVR with a 26 S3 via the TF approach on 11/07/24. Noted to have new onset atrial flutter during admission and amlodipine  switched to Diltiazem  CD 120 mg daily and started on Eliquis  5mg  BID.   Today the patient presents to clinic for follow up. Here with her husband. She is doing okay- thought she would feel better. Has some bruising over pubic bone but groins are healing. Had some middle back pain that is now resolved. No CP. Breathing is slightly improved. Some very mild LE edema but had not been taking Lasix . No orthopnea or PND. No dizziness or syncope. No blood in stool or urine. Occasional palpitations and some fatigue.   Past Medical History:  Diagnosis Date   Anxiety    pt. may have panicy feeling upon awaking from anesth. - states she  feels anxious at times  & has used Paxil  & xanax in the [past but its been a very long time ago    Arthritis    rheumatoid & OA-hands, shoulders    Dyspnea    GERD (gastroesophageal reflux disease)    Heart murmur    Hypertension      Current Medications: Current Meds  Medication Sig   acetaminophen  (TYLENOL ) 650 MG CR tablet Take 1,300 mg by mouth every 8 (eight) hours as needed for pain.   amoxicillin  (AMOXIL ) 500 MG capsule Take 4 capsules (2,000 mg total) by mouth as directed, 1 hour prior to dental work including cleanings   anagrelide  (AGRYLIN) 0.5 MG capsule Take 1 capsule (0.5 mg total) by mouth 2 (two) times daily.   Carboxymethylcellulose Sodium (REFRESH LIQUIGEL) 1 % GEL Place 1 drop into both eyes at bedtime.   diltiazem  (CARDIZEM  CD) 120 MG 24 hr capsule Take 1 capsule (120 mg total) by mouth daily.   FOLIC ACID  PO Take 1 mg by mouth in the morning.   furosemide  (LASIX ) 40 MG tablet Take 1 tablet (40 mg total) by mouth daily.   levothyroxine  (SYNTHROID , LEVOTHROID) 25 MCG tablet Take 25 mcg by mouth daily before breakfast.    methotrexate  (RHEUMATREX) 2.5 MG tablet Take 12.5 mg by mouth every Friday.    Multiple Minerals-Vitamins (  CALCIUM-MAGNESIUM -ZINC-D3 PO) Take 2 tablets by mouth in the morning.   Multiple Vitamin (MULTIVITAMIN WITH MINERALS) TABS tablet Take 1 tablet by mouth in the morning. One A Day Women's Multivitamin   omeprazole (PRILOSEC) 40 MG capsule Take 40 mg by mouth in the morning.   PARoxetine  (PAXIL ) 20 MG tablet Take 20 mg by mouth daily.   polyethylene glycol (MIRALAX / GLYCOLAX) packet Take 17 g by mouth 2 (two) times a week.   Polyvinyl Alcohol -Povidone PF (REFRESH) 1.4-0.6 % SOLN Place 1 drop into both eyes daily.   Probiotic Product (PROBIOTIC DAILY PO) Take 1 capsule by mouth in the morning.   simvastatin  (ZOCOR ) 40 MG tablet Take 40 mg by mouth daily.   [DISCONTINUED] apixaban  (ELIQUIS ) 5 MG TABS tablet Take 1 tablet (5 mg total) by mouth 2 (two)  times daily.      ROS:   Please see the history of present illness.    All other systems reviewed and are negative.  EKGs   EKG Interpretation Date/Time:  Monday November 13 2024 10:03:41 EST Ventricular Rate:  82 PR Interval:    QRS Duration:  94 QT Interval:  380 QTC Calculation: 443 R Axis:   12  Text Interpretation: Atrial flutter with variable A-V block When compared with ECG of 08-Nov-2024 01:26, No significant change was found Confirmed by Sebastian Collar [phone]) on 11/13/2024 10:13:26 AM   Risk Assessment/Calculations:           Physical Exam:    VS:  BP (!) 122/58 (Patient Position: Sitting)   Pulse 92   Ht 5' 2 (1.575 m)   Wt 170 lb 12.8 oz (77.5 kg)   SpO2 96%   BMI 31.24 kg/m     Wt Readings from Last 3 Encounters:  11/13/24 170 lb 12.8 oz (77.5 kg)  11/08/24 172 lb 6.4 oz (78.2 kg)  11/03/24 168 lb 11.2 oz (76.5 kg)     GEN: Well nourished, well developed in no acute distress NECK: No JVD CARDIAC: irreg irreg, no murmurs, rubs, gallops RESPIRATORY:  Clear to auscultation without rales, wheezing or rhonchi  ABDOMEN: Soft, non-tender, non-distended EXTREMITIES:  No edema; No deformity.  Groin sites clear without hematoma. Ecchymosis noted over pubic bone.   ASSESSMENT:    1. S/P TAVR (transcatheter aortic valve replacement)   2. Atypical atrial flutter (HCC)   3. Anemia, unspecified type   4. Primary hypertension   5. Rheumatoid arthritis, involving unspecified site, unspecified whether rheumatoid factor present (HCC)   6. Essential thrombocytosis (HCC)     PLAN:    In order of problems listed above:  Severe AS s/p TAVR:  -- Pt doing okay s/p TAVR.  -- Groin sites healing well.  -- SBE prophylaxis discussed; I have RX'd amoxicillin .  -- Continue Eliquis  5mg  BID. -- Cleared to resume all activities without restriction. -- I will see back for 1 month echo and OV.  Atypical atrial flutter: -- ECG with aflutter with variable block,  HR in 80s. -- New dx after TAVR.  -- Started on Eliquis  5mg  BID cautiously with theoretical increased bleeding risk. Pt worried about this, will reach out to Dr. Federico. -- Amlodipine  switched to Diltiazem  CD 120 mg daily. -- She has some non specific sx that may be related to atrial flutter. Will repeat ECG at next visit and set up for DCCV if she remains in aflutter. I went over the importance of not missing any Eliquis  doses.   Anemia: -- Post procedure Hgb  down to 7.8 but improved to 9.3 the following morning.  -- Check CBC today.    Hypertension: -- Blood pressure well-controlled -- Continue Diltiazem  120mg  daily and Lasix  40mg  daily (wasn't taking this). -- Olmesartan  held 20mg  daily. Will keep her off this. -- BMET at next visit.    Rheumatoid arthritis with lung involvement -- Continue methotrexate  12.5mg  on Fridays and hydroxychlorquine 200mg  daily.    Essential thrombocytosis -- Continue anagrelide  0.5mg  BID.  -- CBC today.      Cardiac Rehabilitation Eligibility Assessment  The patient is ready to start cardiac rehabilitation from a cardiac standpoint.     Medication Adjustments/Labs and Tests Ordered: Current medicines are reviewed at length with the patient today.  Concerns regarding medicines are outlined above.  Orders Placed This Encounter  Procedures   CBC w/Diff   EKG 12-Lead   ECHOCARDIOGRAM COMPLETE   Meds ordered this encounter  Medications   apixaban  (ELIQUIS ) 5 MG TABS tablet    Sig: Take 1 tablet (5 mg total) by mouth 2 (two) times daily.    Dispense:  180 tablet    Refill:  3    Supervising Provider:   WONDA, MICHAEL [[id number]]   amoxicillin  (AMOXIL ) 500 MG capsule    Sig: Take 4 capsules (2,000 mg total) by mouth as directed, 1 hour prior to dental work including cleanings    Dispense:  12 capsule    Refill:  12    Supervising Provider:   WONDA SHARPER [[id number]]    Patient Instructions  Medication Instructions:  Your physician has  recommended you make the following change in your medication:  START Amoxicillin  500 mg, take 4 tablets by mouth 1 hour prior to dental procedures and cleanings.    CONTINUE to take Lasix  40mg  daily. A refill of Eliquis  has been sent to the pharmacy for you to pick up today. *If you need a refill on your cardiac medications before your next appointment, please call your pharmacy*  Lab Work: TODAY CBC If you have labs (blood work) drawn today and your tests are completely normal, you will receive your results only by: MyChart Message (if you have MyChart) OR A paper copy in the mail If you have any lab test that is abnormal or we need to change your treatment, we will call you to review the results.  Testing/Procedures: 12/14/24 at the [facility] Location Your physician has requested that you have an echocardiogram. Echocardiography is a painless test that uses sound waves to create images of your heart. It provides your doctor with information about the size and shape of your heart and how well your heart's chambers and valves are working. This procedure takes approximately one hour. There are no restrictions for this procedure. Please do NOT wear cologne, perfume, aftershave, or lotions (deodorant is allowed). Please arrive 15 minutes prior to your appointment time.  Please note: We ask at that you not bring children with you during ultrasound (echo/ vascular) testing. Due to room size and safety concerns, children are not allowed in the ultrasound rooms during exams. Our front office staff cannot provide observation of children in our lobby area while testing is being conducted. An adult accompanying a patient to their appointment will only be allowed in the ultrasound room at the discretion of the ultrasound technician under special circumstances. We apologize for any inconvenience.   Follow-Up: At [facility], you and your health needs are our priority.  As part of our  continuing mission to provide  you with exceptional heart care, our providers are all part of one team.  This team includes your primary Cardiologist (physician) and Advanced Practice Providers or APPs (Physician Assistants and Nurse Practitioners) who all work together to provide you with the care you need, when you need it.  Your next appointment:   As scheduled on 12/14/24  Provider:   Izetta Hummer, PA-C  We recommend signing up for the patient portal called MyChart.  Sign up information is provided on this After Visit Summary.  MyChart is used to connect with patients for Virtual Visits (Telemedicine).  Patients are able to view lab/test results, encounter notes, upcoming appointments, etc.  Non-urgent messages can be sent to your provider as well.   To learn more about what you can do with MyChart, go to [email or web address].          Signed, Lamarr Hummer, PA-C  11/13/2024 12:53 PM    [facility]

## 2024-11-13 NOTE — Patient Instructions (Signed)
 Medication Instructions:  Your physician has recommended you make the following change in your medication:  START Amoxicillin 500 mg, take 4 tablets by mouth 1 hour prior to dental procedures and cleanings.    CONTINUE to take Lasix 40mg  daily. A refill of Eliquis has been sent to the pharmacy for you to pick up today. *If you need a refill on your cardiac medications before your next appointment, please call your pharmacy*  Lab Work: TODAY CBC If you have labs (blood work) drawn today and your tests are completely normal, you will receive your results only by: MyChart Message (if you have MyChart) OR A paper copy in the mail If you have any lab test that is abnormal or we need to change your treatment, we will call you to review the results.  Testing/Procedures: 12/14/24 at the [facility] Location Your physician has requested that you have an echocardiogram. Echocardiography is a painless test that uses sound waves to create images of your heart. It provides your doctor with information about the size and shape of your heart and how well your heart's chambers and valves are working. This procedure takes approximately one hour. There are no restrictions for this procedure. Please do NOT wear cologne, perfume, aftershave, or lotions (deodorant is allowed). Please arrive 15 minutes prior to your appointment time.  Please note: We ask at that you not bring children with you during ultrasound (echo/ vascular) testing. Due to room size and safety concerns, children are not allowed in the ultrasound rooms during exams. Our front office staff cannot provide observation of children in our lobby area while testing is being conducted. An adult accompanying a patient to their appointment will only be allowed in the ultrasound room at the discretion of the ultrasound technician under special circumstances. We apologize for any inconvenience.   Follow-Up: At [facility], you and your  health needs are our priority.  As part of our continuing mission to provide you with exceptional heart care, our providers are all part of one team.  This team includes your primary Cardiologist (physician) and Advanced Practice Providers or APPs (Physician Assistants and Nurse Practitioners) who all work together to provide you with the care you need, when you need it.  Your next appointment:   As scheduled on 12/14/24  Provider:   Izetta Hummer, PA-C  We recommend signing up for the patient portal called MyChart.  Sign up information is provided on this After Visit Summary.  MyChart is used to connect with patients for Virtual Visits (Telemedicine).  Patients are able to view lab/test results, encounter notes, upcoming appointments, etc.  Non-urgent messages can be sent to your provider as well.   To learn more about what you can do with MyChart, go to [email or web address].

## 2024-11-14 ENCOUNTER — Ambulatory Visit: Payer: Self-pay | Admitting: Physician Assistant

## 2024-11-14 ENCOUNTER — Other Ambulatory Visit (HOSPITAL_COMMUNITY): Payer: Self-pay

## 2024-11-14 LAB — CBC WITH DIFFERENTIAL/PLATELET
Basophils Absolute: 0.1 x10E3/uL (ref 0.0–0.2)
Basos: 2 %
EOS (ABSOLUTE): 0 x10E3/uL (ref 0.0–0.4)
Eos: 0 %
Hematocrit: 24.9 % — ABNORMAL LOW (ref 34.0–46.6)
Hemoglobin: 8.2 g/dL — ABNORMAL LOW (ref 11.1–15.9)
Immature Grans (Abs): 0.1 x10E3/uL (ref 0.0–0.1)
Immature Granulocytes: 1 %
Lymphocytes Absolute: 1.1 x10E3/uL (ref 0.7–3.1)
Lymphs: 14 %
MCH: 31.3 pg (ref 26.6–33.0)
MCHC: 32.9 g/dL (ref 31.5–35.7)
MCV: 95 fL (ref 79–97)
Monocytes Absolute: 1.2 x10E3/uL — ABNORMAL HIGH (ref 0.1–0.9)
Monocytes: 16 %
Neutrophils Absolute: 5.2 x10E3/uL (ref 1.4–7.0)
Neutrophils: 67 %
Platelets: 542 x10E3/uL — ABNORMAL HIGH (ref 150–450)
RBC: 2.62 x10E6/uL — CL (ref 3.77–5.28)
RDW: 16.1 % — ABNORMAL HIGH (ref 11.7–15.4)
WBC: 7.8 x10E3/uL (ref 3.4–10.8)

## 2024-11-17 ENCOUNTER — Telehealth: Payer: Self-pay | Admitting: *Deleted

## 2024-11-17 ENCOUNTER — Other Ambulatory Visit: Payer: Self-pay | Admitting: *Deleted

## 2024-11-17 ENCOUNTER — Other Ambulatory Visit: Payer: Self-pay

## 2024-11-17 DIAGNOSIS — D473 Essential (hemorrhagic) thrombocythemia: Secondary | ICD-10-CM

## 2024-11-17 MED ORDER — FUROSEMIDE 40 MG PO TABS: 40.0000 mg | ORAL_TABLET | Freq: Every day | ORAL | 3 refills | Status: AC

## 2024-11-17 NOTE — Telephone Encounter (Signed)
 Received call from pt. She is concerned with her lab results from the cardiologist on 11/13/24. Reviewed them with her and discussed with Dr. Federico. Advised to continue her anagrelide  and then advised that we will re-check her labs next week on 11/22/24.  Pt voiced understanding. Lamarr Hummer, PA-C in cardiology was sent a message with the above information.

## 2024-11-22 ENCOUNTER — Inpatient Hospital Stay

## 2024-11-22 DIAGNOSIS — Z7982 Long term (current) use of aspirin: Secondary | ICD-10-CM | POA: Diagnosis not present

## 2024-11-22 DIAGNOSIS — D75839 Thrombocytosis, unspecified: Secondary | ICD-10-CM | POA: Diagnosis not present

## 2024-11-22 DIAGNOSIS — I110 Hypertensive heart disease with heart failure: Secondary | ICD-10-CM | POA: Diagnosis not present

## 2024-11-22 DIAGNOSIS — Z79899 Other long term (current) drug therapy: Secondary | ICD-10-CM | POA: Diagnosis not present

## 2024-11-22 DIAGNOSIS — D473 Essential (hemorrhagic) thrombocythemia: Secondary | ICD-10-CM

## 2024-11-22 LAB — CBC WITH DIFFERENTIAL (CANCER CENTER ONLY)
Abs Immature Granulocytes: 0.13 K/uL — ABNORMAL HIGH (ref 0.00–0.07)
Basophils Absolute: 0.2 K/uL — ABNORMAL HIGH (ref 0.0–0.1)
Basophils Relative: 2 %
Eosinophils Absolute: 0.1 K/uL (ref 0.0–0.5)
Eosinophils Relative: 1 %
HCT: 30.9 % — ABNORMAL LOW (ref 36.0–46.0)
Hemoglobin: 10.5 g/dL — ABNORMAL LOW (ref 12.0–15.0)
Immature Granulocytes: 1 %
Lymphocytes Relative: 14 %
Lymphs Abs: 1.4 K/uL (ref 0.7–4.0)
MCH: 31 pg (ref 26.0–34.0)
MCHC: 34 g/dL (ref 30.0–36.0)
MCV: 91.2 fL (ref 80.0–100.0)
Monocytes Absolute: 1.6 K/uL — ABNORMAL HIGH (ref 0.1–1.0)
Monocytes Relative: 16 %
Neutro Abs: 6.7 K/uL (ref 1.7–7.7)
Neutrophils Relative %: 66 %
Platelet Count: 513 K/uL — ABNORMAL HIGH (ref 150–400)
RBC: 3.39 MIL/uL — ABNORMAL LOW (ref 3.87–5.11)
RDW: 16.9 % — ABNORMAL HIGH (ref 11.5–15.5)
WBC Count: 10 K/uL (ref 4.0–10.5)
nRBC: 0 % (ref 0.0–0.2)

## 2024-11-22 LAB — SAMPLE TO BLOOD BANK

## 2024-11-22 LAB — CMP (CANCER CENTER ONLY)
ALT: 12 U/L (ref 0–44)
AST: 28 U/L (ref 15–41)
Albumin: 4.5 g/dL (ref 3.5–5.0)
Alkaline Phosphatase: 95 U/L (ref 38–126)
Anion gap: 11 (ref 5–15)
BUN: 21 mg/dL (ref 8–23)
CO2: 28 mmol/L (ref 22–32)
Calcium: 9.5 mg/dL (ref 8.9–10.3)
Chloride: 94 mmol/L — ABNORMAL LOW (ref 98–111)
Creatinine: 1.2 mg/dL — ABNORMAL HIGH (ref 0.44–1.00)
GFR, Estimated: 44 mL/min — ABNORMAL LOW (ref >60)
Glucose, Bld: 119 mg/dL — ABNORMAL HIGH (ref 70–99)
Potassium: 4.4 mmol/L (ref 3.5–5.1)
Sodium: 133 mmol/L — ABNORMAL LOW (ref 135–145)
Total Bilirubin: 0.3 mg/dL (ref 0.0–1.2)
Total Protein: 7.6 g/dL (ref 6.5–8.1)

## 2024-11-29 ENCOUNTER — Other Ambulatory Visit: Payer: Self-pay

## 2024-11-29 ENCOUNTER — Other Ambulatory Visit (HOSPITAL_COMMUNITY): Payer: Self-pay

## 2024-11-30 ENCOUNTER — Other Ambulatory Visit: Payer: Self-pay

## 2024-11-30 ENCOUNTER — Other Ambulatory Visit (HOSPITAL_COMMUNITY): Payer: Self-pay

## 2024-12-01 ENCOUNTER — Inpatient Hospital Stay: Attending: Hematology and Oncology

## 2024-12-01 ENCOUNTER — Other Ambulatory Visit (HOSPITAL_BASED_OUTPATIENT_CLINIC_OR_DEPARTMENT_OTHER): Payer: Self-pay

## 2024-12-01 DIAGNOSIS — D473 Essential (hemorrhagic) thrombocythemia: Secondary | ICD-10-CM | POA: Insufficient documentation

## 2024-12-08 ENCOUNTER — Inpatient Hospital Stay

## 2024-12-08 DIAGNOSIS — D473 Essential (hemorrhagic) thrombocythemia: Secondary | ICD-10-CM | POA: Diagnosis present

## 2024-12-08 LAB — CBC WITH DIFFERENTIAL (CANCER CENTER ONLY)
Abs Immature Granulocytes: 0.08 K/uL — ABNORMAL HIGH (ref 0.00–0.07)
Basophils Absolute: 0.1 K/uL (ref 0.0–0.1)
Basophils Relative: 2 %
Eosinophils Absolute: 0.1 K/uL (ref 0.0–0.5)
Eosinophils Relative: 1 %
HCT: 29.8 % — ABNORMAL LOW (ref 36.0–46.0)
Hemoglobin: 10 g/dL — ABNORMAL LOW (ref 12.0–15.0)
Immature Granulocytes: 1 %
Lymphocytes Relative: 16 %
Lymphs Abs: 1.4 K/uL (ref 0.7–4.0)
MCH: 30.8 pg (ref 26.0–34.0)
MCHC: 33.6 g/dL (ref 30.0–36.0)
MCV: 91.7 fL (ref 80.0–100.0)
Monocytes Absolute: 1.1 K/uL — ABNORMAL HIGH (ref 0.1–1.0)
Monocytes Relative: 13 %
Neutro Abs: 5.9 K/uL (ref 1.7–7.7)
Neutrophils Relative %: 67 %
Platelet Count: 445 K/uL — ABNORMAL HIGH (ref 150–400)
RBC: 3.25 MIL/uL — ABNORMAL LOW (ref 3.87–5.11)
RDW: 16.5 % — ABNORMAL HIGH (ref 11.5–15.5)
WBC Count: 8.6 K/uL (ref 4.0–10.5)
nRBC: 0 % (ref 0.0–0.2)

## 2024-12-08 LAB — CMP (CANCER CENTER ONLY)
ALT: 10 U/L (ref 0–44)
AST: 24 U/L (ref 15–41)
Albumin: 4.5 g/dL (ref 3.5–5.0)
Alkaline Phosphatase: 95 U/L (ref 38–126)
Anion gap: 12 (ref 5–15)
BUN: 25 mg/dL — ABNORMAL HIGH (ref 8–23)
CO2: 25 mmol/L (ref 22–32)
Calcium: 9.2 mg/dL (ref 8.9–10.3)
Chloride: 97 mmol/L — ABNORMAL LOW (ref 98–111)
Creatinine: 1.22 mg/dL — ABNORMAL HIGH (ref 0.44–1.00)
GFR, Estimated: 44 mL/min — ABNORMAL LOW (ref >60)
Glucose, Bld: 146 mg/dL — ABNORMAL HIGH (ref 70–99)
Potassium: 3.8 mmol/L (ref 3.5–5.1)
Sodium: 134 mmol/L — ABNORMAL LOW (ref 135–145)
Total Bilirubin: 0.3 mg/dL (ref 0.0–1.2)
Total Protein: 7.4 g/dL (ref 6.5–8.1)

## 2024-12-13 NOTE — Progress Notes (Unsigned)
 [facility]   MULTIDISCIPLINARY HEART VALVE CLINIC                                     Cardiology Office Note:    Date:  12/14/2024   ID:  [name], DOB Jan 18, 1941, MRN [id number]  PCP:  Elliot Charm, MD  [facility] Cardiologist:  Lonni LITTIE Nanas, MD  [facility] Structural heart: Lurena MARLA Red, MD [facility] Electrophysiologist:  Donnice DELENA Primus, MD   Referring MD: Elliot Charm, MD   1 month s/p TAVR  History of Present Illness:    Rhonda [name] is a 83 y.o. 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   Admitted to [facility] in 09/2024 for acute CHF. Echo 10/11/24 showed EF 50-55%, with hypokinesis of the distal septum and apex, mild LV dilation, mildly elevated pulm pressure, moderate MAC with mild MR and severe aortic stenosis with a mean gradient of 44.4 mm hg, Vmax 4.5 m/s. [facility] 10/12/24 showed mild non-obstructive CAD and mild elevation right heart pressures. S/p TAVR with a 26 S3 via the TF approach on 11/07/24. Noted to have new onset atrial flutter during admission and amlodipine  switched to Diltiazem  CD 120 mg daily and started on Eliquis  5mg  BID.   Today the patient presents to clinic for follow up. Here with her husband.   Past Medical History:  Diagnosis Date   Anxiety    pt. may have panicy feeling upon awaking from anesth. - states she feels anxious at times  & has used Paxil  & xanax in the [past but its been a very long time ago    Arthritis    rheumatoid & OA-hands, shoulders    Dyspnea    GERD (gastroesophageal reflux disease)    Heart murmur    Hypertension      Current Medications: Current Meds  Medication Sig   acetaminophen  (TYLENOL ) 650 MG CR tablet Take 1,300 mg by  mouth every 8 (eight) hours as needed for pain.   amoxicillin  (AMOXIL ) 500 MG capsule Take 4 capsules (2,000 mg total) by mouth as directed, 1 hour prior to dental work including cleanings   anagrelide  (AGRYLIN) 0.5 MG capsule Take 1 capsule (0.5 mg total) by mouth 2 (two) times daily.   apixaban  (ELIQUIS ) 5 MG TABS tablet Take 1 tablet (5 mg total) by mouth 2 (two) times daily.   Carboxymethylcellulose Sodium (REFRESH LIQUIGEL) 1 % GEL Place 1 drop into both eyes at bedtime.   FOLIC ACID  PO Take 1 mg by mouth in the morning.   furosemide  (LASIX ) 40 MG tablet Take 1 tablet (40 mg total) by mouth daily.   levothyroxine  (SYNTHROID , LEVOTHROID) 25 MCG tablet Take 25 mcg by mouth daily before breakfast.    methotrexate  (RHEUMATREX) 2.5 MG tablet Take 12.5 mg by mouth every Friday.    Multiple Minerals-Vitamins (CALCIUM-MAGNESIUM -ZINC-D3 PO) Take 2 tablets by mouth in the morning.   Multiple Vitamin (MULTIVITAMIN WITH MINERALS) TABS tablet Take 1 tablet by mouth in the morning. One A Day Women's Multivitamin   omeprazole (PRILOSEC) 40 MG capsule Take 40 mg by mouth in the morning.   PARoxetine  (PAXIL ) 20 MG tablet Take 20 mg by mouth daily.  polyethylene glycol (MIRALAX / GLYCOLAX) packet Take 17 g by mouth 2 (two) times a week.   Polyvinyl Alcohol -Povidone PF (REFRESH) 1.4-0.6 % SOLN Place 1 drop into both eyes daily.   Probiotic Product (PROBIOTIC DAILY PO) Take 1 capsule by mouth in the morning.   simvastatin  (ZOCOR ) 40 MG tablet Take 40 mg by mouth daily.   [DISCONTINUED] diltiazem  (CARDIZEM  CD) 120 MG 24 hr capsule Take 1 capsule (120 mg total) by mouth daily.      ROS:   Please see the history of present illness.    All other systems reviewed and are negative.  EKGs       Risk Assessment/Calculations:       {This patient may be at risk for Amyloid. She has one or more dx on the prob list or PMH from the following list -  Abnormal EKG, HFpEF/Diastolic CHF, Aortic Stenosis, LVH,  Bilateral Carpal Tunnel Syndrome, Biceps Tendon Rupture, Spinal Stenosis, Pericardial Effusion, Left Atrial Enlargement, Conduction System Disorder. See list below or review PMH.  Diagnoses From Problem List           Noted     Acute on chronic diastolic CHF (congestive heart failure) (HCC) 10/11/2024     Aortic stenosis 11/07/2024     Severe aortic stenosis 10/11/2024    Click HERE to open Cardiac Amyloid Screening SmartSet to order screening OR Click HERE to defer testing for 1 year or permanently :1}    Physical Exam:    VS:  BP (!) 148/74   Pulse (!) 103   Ht 5' 2 (1.575 m)   Wt 164 lb 6.4 oz (74.6 kg)   SpO2 98%   BMI 30.07 kg/m     Wt Readings from Last 3 Encounters:  12/14/24 164 lb 6.4 oz (74.6 kg)  11/13/24 170 lb 12.8 oz (77.5 kg)  11/08/24 172 lb 6.4 oz (78.2 kg)     GEN: Well nourished, well developed in no acute distress NECK: No JVD CARDIAC: irreg irreg, no murmurs, rubs, gallops RESPIRATORY:  Clear to auscultation without rales, wheezing or rhonchi  ABDOMEN: Soft, non-tender, non-distended EXTREMITIES:  No edema; No deformity.    ASSESSMENT:    1. S/P TAVR (transcatheter aortic valve replacement)   2. Atypical atrial flutter (HCC)   3. Anemia, unspecified type   4. Primary hypertension   5. Rheumatoid arthritis, involving unspecified site, unspecified whether rheumatoid factor present (HCC)   6. Essential thrombocytosis (HCC)     PLAN:    In order of problems listed above:  Severe AS s/p TAVR:  -- Echo today shows EF ***, normally functioning TAVR with a mean gradient of *** mm hg and *** PVL.  -- NYHA class II; mostly of fatigue.  -- SBE prophylaxis discussed; I have RX'd amoxicillin .  -- Continue Eliquis  5mg  BID. -- I will see back for 1 year echo and OV.  Persistent atypical atrial flutter: -- New dx after TAVR and started on Eliquis  5mg  BID. -- Amlodipine  switched to Diltiazem  CD 120 mg daily. Increase to 240mg  daily given elevated BP and  HRs 90-110s.  -- She has some non specific sx that may be related to atrial flutter. I was considering setting her up for cardioversion but will defer to Dr. Almetta who she is seeing in January.   Anemia: -- Hg 10 on most recent labs (personally reviewed).   Hypertension: -- Blood pressure elevated today.  -- Increase Diltiazem  CD 120mg  daily to 240mg  daily given elevated BP  and HRs 90-110s. -- Continue Lasix  40mg  daily. -- Creat 1.22, K 3.8 on recent labs (personally reviewed).  Rheumatoid arthritis with lung involvement -- Continue methotrexate  12.5mg  on Fridays and hydroxychlorquine 200mg  daily.    Essential thrombocytosis -- Continue anagrelide  0.5mg  BID.  -- PLT 445 on most recent labs (personally reviewed).  {The patient has an active order for outpatient cardiac rehabilitation.   Please indicate if the patient is ready to start. Do NOT delete this.  It will auto delete.  Refresh note, then sign.              Click here to document readiness and see contraindications.  :1}  Cardiac Rehabilitation Eligibility Assessment  The patient is ready to start cardiac rehabilitation from a cardiac standpoint.     Medication Adjustments/Labs and Tests Ordered: Current medicines are reviewed at length with the patient today.  Concerns regarding medicines are outlined above.  Orders Placed This Encounter  Procedures   ECHOCARDIOGRAM COMPLETE   Meds ordered this encounter  Medications   diltiazem  (CARDIZEM  CD) 240 MG 24 hr capsule    Sig: Take 1 capsule (240 mg total) by mouth daily.    Dispense:  90 capsule    Refill:  3    Patient Instructions  Medication Instructions:  Your physician has recommended you make the following change in your medication:  INCREASE diltiazem  to 240mg  daily  *If you need a refill on your cardiac medications before your next appointment, please call your pharmacy*  Lab Work: None needed If you have labs (blood work) drawn today and your tests  are completely normal, you will receive your results only by: MyChart Message (if you have MyChart) OR A paper copy in the mail If you have any lab test that is abnormal or we need to change your treatment, we will call you to review the results.  Testing/Procedures: 11/08/25 Your physician has requested that you have an echocardiogram. Echocardiography is a painless test that uses sound waves to create images of your heart. It provides your doctor with information about the size and shape of your heart and how well your hearts chambers and valves are working. This procedure takes approximately one hour. There are no restrictions for this procedure. Please do NOT wear cologne, perfume, aftershave, or lotions (deodorant is allowed). Please arrive 15 minutes prior to your appointment time.  Please note: We ask at that you not bring children with you during ultrasound (echo/ vascular) testing. Due to room size and safety concerns, children are not allowed in the ultrasound rooms during exams. Our front office staff cannot provide observation of children in our lobby area while testing is being conducted. An adult accompanying a patient to their appointment will only be allowed in the ultrasound room at the discretion of the ultrasound technician under special circumstances. We apologize for any inconvenience.   Follow-Up: At [facility], you and your health needs are our priority.  As part of our continuing mission to provide you with exceptional heart care, our providers are all part of one team.  This team includes your primary Cardiologist (physician) and Advanced Practice Providers or APPs (Physician Assistants and Nurse Practitioners) who all work together to provide you with the care you need, when you need it.  Your next appointment:   6 months  Provider:   Dr. Kate  We recommend signing up for the patient portal called MyChart.  Sign up information is provided on this After  Visit Summary.  MyChart is used to connect with patients for Virtual Visits (Telemedicine).  Patients are able to view lab/test results, encounter notes, upcoming appointments, etc.  Non-urgent messages can be sent to your provider as well.   To learn more about what you can do with MyChart, go to [email or web address].          Signed, Lamarr Hummer, PA-C  12/14/2024 3:23 PM    [facility]

## 2024-12-14 ENCOUNTER — Ambulatory Visit: Attending: Cardiovascular Disease | Admitting: Physician Assistant

## 2024-12-14 ENCOUNTER — Other Ambulatory Visit (HOSPITAL_BASED_OUTPATIENT_CLINIC_OR_DEPARTMENT_OTHER)

## 2024-12-14 VITALS — BP 148/74 | HR 103 | Ht 62.0 in | Wt 164.4 lb

## 2024-12-14 DIAGNOSIS — I10 Essential (primary) hypertension: Secondary | ICD-10-CM | POA: Diagnosis not present

## 2024-12-14 DIAGNOSIS — M069 Rheumatoid arthritis, unspecified: Secondary | ICD-10-CM

## 2024-12-14 DIAGNOSIS — D473 Essential (hemorrhagic) thrombocythemia: Secondary | ICD-10-CM

## 2024-12-14 DIAGNOSIS — I484 Atypical atrial flutter: Secondary | ICD-10-CM | POA: Diagnosis not present

## 2024-12-14 DIAGNOSIS — D649 Anemia, unspecified: Secondary | ICD-10-CM

## 2024-12-14 DIAGNOSIS — Z952 Presence of prosthetic heart valve: Secondary | ICD-10-CM | POA: Diagnosis not present

## 2024-12-14 LAB — ECHOCARDIOGRAM COMPLETE
AR max vel: 1.38 cm2
AV Area VTI: 1.56 cm2
AV Area mean vel: 1.36 cm2
AV Mean grad: 7 mmHg
AV Peak grad: 12.7 mmHg
Ao pk vel: 1.78 m/s
Area-P 1/2: 5.66 cm2
MV M vel: 5.32 m/s
MV Peak grad: 113.2 mmHg
Radius: 0.7 cm
S' Lateral: 2.23 cm

## 2024-12-14 MED ORDER — DILTIAZEM HCL ER COATED BEADS 240 MG PO CP24: 240.0000 mg | ORAL_CAPSULE | Freq: Every day | ORAL | 3 refills | Status: AC

## 2024-12-14 MED ORDER — PERFLUTREN LIPID MICROSPHERE
1.0000 mL | INTRAVENOUS | Status: AC | PRN
  Administered 2024-12-14: 14:00:00 1 mL via INTRAVENOUS

## 2024-12-14 NOTE — Patient Instructions (Signed)
 Medication Instructions:  Your physician has recommended you make the following change in your medication:  INCREASE diltiazem  to 240mg  daily  *If you need a refill on your cardiac medications before your next appointment, please call your pharmacy*  Lab Work: None needed If you have labs (blood work) drawn today and your tests are completely normal, you will receive your results only by: MyChart Message (if you have MyChart) OR A paper copy in the mail If you have any lab test that is abnormal or we need to change your treatment, we will call you to review the results.  Testing/Procedures: 11/08/25 Your physician has requested that you have an echocardiogram. Echocardiography is a painless test that uses sound waves to create images of your heart. It provides your doctor with information about the size and shape of your heart and how well your hearts chambers and valves are working. This procedure takes approximately one hour. There are no restrictions for this procedure. Please do NOT wear cologne, perfume, aftershave, or lotions (deodorant is allowed). Please arrive 15 minutes prior to your appointment time.  Please note: We ask at that you not bring children with you during ultrasound (echo/ vascular) testing. Due to room size and safety concerns, children are not allowed in the ultrasound rooms during exams. Our front office staff cannot provide observation of children in our lobby area while testing is being conducted. An adult accompanying a patient to their appointment will only be allowed in the ultrasound room at the discretion of the ultrasound technician under special circumstances. We apologize for any inconvenience.   Follow-Up: At [facility], you and your health needs are our priority.  As part of our continuing mission to provide you with exceptional heart care, our providers are all part of one team.  This team includes your primary Cardiologist (physician) and  Advanced Practice Providers or APPs (Physician Assistants and Nurse Practitioners) who all work together to provide you with the care you need, when you need it.  Your next appointment:   6 months  Provider:   Dr. Kate  We recommend signing up for the patient portal called MyChart.  Sign up information is provided on this After Visit Summary.  MyChart is used to connect with patients for Virtual Visits (Telemedicine).  Patients are able to view lab/test results, encounter notes, upcoming appointments, etc.  Non-urgent messages can be sent to your provider as well.   To learn more about what you can do with MyChart, go to [email or web address].

## 2024-12-15 ENCOUNTER — Telehealth (HOSPITAL_COMMUNITY): Payer: Self-pay

## 2024-12-15 NOTE — Telephone Encounter (Signed)
 Spoke with patient, informed her we would need her to attend her appt on 1/16 with Dr. Almetta before we can move forward with scheduling. Patient acknowledged understanding.

## 2024-12-27 ENCOUNTER — Telehealth: Payer: Self-pay | Admitting: Hematology and Oncology

## 2024-12-27 NOTE — Telephone Encounter (Signed)
 I SPOKE WITH PATIENT AND SHE IS AWARE TO COME IN AT 9:15 AM ON 01/05/2025 FOR LAB AND MD APPOINTMENTS.

## 2025-01-01 ENCOUNTER — Ambulatory Visit (HOSPITAL_COMMUNITY)

## 2025-01-01 ENCOUNTER — Ambulatory Visit: Admitting: Physician Assistant

## 2025-01-05 ENCOUNTER — Inpatient Hospital Stay: Admitting: Hematology and Oncology

## 2025-01-05 ENCOUNTER — Inpatient Hospital Stay

## 2025-01-05 ENCOUNTER — Inpatient Hospital Stay: Attending: Hematology and Oncology

## 2025-01-05 VITALS — BP 137/51 | HR 70 | Temp 97.6°F | Resp 17 | Ht 62.0 in | Wt 166.0 lb

## 2025-01-05 DIAGNOSIS — Z803 Family history of malignant neoplasm of breast: Secondary | ICD-10-CM | POA: Insufficient documentation

## 2025-01-05 DIAGNOSIS — Z7901 Long term (current) use of anticoagulants: Secondary | ICD-10-CM | POA: Insufficient documentation

## 2025-01-05 DIAGNOSIS — D473 Essential (hemorrhagic) thrombocythemia: Secondary | ICD-10-CM

## 2025-01-05 DIAGNOSIS — D75839 Thrombocytosis, unspecified: Secondary | ICD-10-CM

## 2025-01-05 DIAGNOSIS — Z79890 Hormone replacement therapy (postmenopausal): Secondary | ICD-10-CM | POA: Insufficient documentation

## 2025-01-05 DIAGNOSIS — Z79899 Other long term (current) drug therapy: Secondary | ICD-10-CM | POA: Diagnosis not present

## 2025-01-05 LAB — CMP (CANCER CENTER ONLY)
ALT: 10 U/L (ref 0–44)
AST: 23 U/L (ref 15–41)
Albumin: 4.2 g/dL (ref 3.5–5.0)
Alkaline Phosphatase: 104 U/L (ref 38–126)
Anion gap: 11 (ref 5–15)
BUN: 22 mg/dL (ref 8–23)
CO2: 28 mmol/L (ref 22–32)
Calcium: 9.1 mg/dL (ref 8.9–10.3)
Chloride: 99 mmol/L (ref 98–111)
Creatinine: 1.18 mg/dL — ABNORMAL HIGH (ref 0.44–1.00)
GFR, Estimated: 45 mL/min — ABNORMAL LOW
Glucose, Bld: 124 mg/dL — ABNORMAL HIGH (ref 70–99)
Potassium: 4.2 mmol/L (ref 3.5–5.1)
Sodium: 137 mmol/L (ref 135–145)
Total Bilirubin: 0.4 mg/dL (ref 0.0–1.2)
Total Protein: 7.1 g/dL (ref 6.5–8.1)

## 2025-01-05 LAB — CBC WITH DIFFERENTIAL (CANCER CENTER ONLY)
Abs Immature Granulocytes: 0.1 K/uL — ABNORMAL HIGH (ref 0.00–0.07)
Basophils Absolute: 0.1 K/uL (ref 0.0–0.1)
Basophils Relative: 2 %
Eosinophils Absolute: 0.1 K/uL (ref 0.0–0.5)
Eosinophils Relative: 1 %
HCT: 29.9 % — ABNORMAL LOW (ref 36.0–46.0)
Hemoglobin: 9.9 g/dL — ABNORMAL LOW (ref 12.0–15.0)
Immature Granulocytes: 1 %
Lymphocytes Relative: 19 %
Lymphs Abs: 1.6 K/uL (ref 0.7–4.0)
MCH: 30 pg (ref 26.0–34.0)
MCHC: 33.1 g/dL (ref 30.0–36.0)
MCV: 90.6 fL (ref 80.0–100.0)
Monocytes Absolute: 1.2 K/uL — ABNORMAL HIGH (ref 0.1–1.0)
Monocytes Relative: 15 %
Neutro Abs: 4.9 K/uL (ref 1.7–7.7)
Neutrophils Relative %: 62 %
Platelet Count: 273 K/uL (ref 150–400)
RBC: 3.3 MIL/uL — ABNORMAL LOW (ref 3.87–5.11)
RDW: 17.5 % — ABNORMAL HIGH (ref 11.5–15.5)
WBC Count: 8.1 K/uL (ref 4.0–10.5)
nRBC: 0.4 % — ABNORMAL HIGH (ref 0.0–0.2)

## 2025-01-05 NOTE — Progress Notes (Unsigned)
 " [facility] [phone]   [phone]  PROGRESS NOTE  Patient Care Team: Elliot Charm, MD as PCP - General (Internal Medicine) Kate Lonni CROME, MD as PCP - Cardiology (Cardiology) Wendel Lurena POUR, MD as PCP - Structural Heart (Cardiology) Almetta Donnice LABOR, MD as PCP - Electrophysiology (Cardiology)  Hematological/Oncological History # Essential thrombocytosis, CALR positive # Thrombocytosis 11/16/2023: WBC 7.2, Hgb 11.2, MCV 96.4, Plt 448 12/08/2023: establish care with Dr. Federico.  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 05/31/2024: Transition to anagrelide  1 mg twice daily as hydroxyurea  is dropping her blood counts without controlling the platelets  Interval History:  Rhonda [name] 84 y.o. female with medical history significant for newly diagnosed essential thrombocytosis who presents for a follow up visit. The patient's last visit was on 11/03/2024. In the interim since the last visit she has been well since her discharge from the hospital on 11/08/2024.  On exam today Rhonda [name] reports she has been well overall in the interim since our last visit.  She reports that a dog did bite her in the face and recently she has been using antibiotic ointment for this.  She reports she also recently bought a Callicoat.  She reports that she is feeling a little more fatigued but is not having any overt signs of bleeding, bruising, or dark stools.  She is faithfully taking anagrelide  0.5 mg twice daily.  It is not causing any nausea, vomiting, or diarrhea.  She reports that she overall feels well and has no additional questions concerns or complaints today.  She is willing and able to continue therapy as prescribed.  A full 10 point ROS is otherwise negative.  MEDICAL HISTORY:  Past Medical History:  Diagnosis Date   Anxiety    pt.  may have panicy feeling upon awaking from anesth. - states she feels anxious at times  & has used Paxil  & xanax in the [past but its been a very long time ago    Arthritis    rheumatoid & OA-hands, shoulders    Dyspnea    GERD (gastroesophageal reflux disease)    Heart murmur    Hypertension     SURGICAL HISTORY: Past Surgical History:  Procedure Laterality Date   APPENDECTOMY     laproscopic   BREAST BIOPSY Left 09/10/2023   MM LT BREAST BX W LOC DEV 1ST LESION IMAGE BX SPEC STEREO GUIDE 09/10/2023 [facility]   BREAST BIOPSY Left 09/10/2023   MM LT BREAST BX W LOC DEV EA AD LESION IMG BX SPEC STEREO GUIDE 09/10/2023 [facility]   BREAST EXCISIONAL BIOPSY Left    COLONOSCOPY     HEMORROIDECTOMY     INSERTION OF MESH N/A 09/12/2018   Procedure: INSERTION OF MESH;  Surgeon: Curvin Deward MOULD, MD;  Location: [facility] OR;  Service: General;  Laterality: N/A;   INTRAOPERATIVE TRANSTHORACIC ECHOCARDIOGRAM N/A 11/07/2024   Procedure: ECHOCARDIOGRAM, TRANSTHORACIC;  Surgeon: Wendel Lurena POUR, MD;  Location: [facility] INVASIVE CV LAB;  Service: Cardiovascular;  Laterality: N/A;   LAPAROSCOPIC APPENDECTOMY N/A 01/06/2017   Procedure: LAPAROSCOPIC APPENDECTOMY;  Surgeon: Deward Curvin III, MD;  Location: [facility] OR;  Service: General;  Laterality: N/A;   RIGHT HEART CATH AND CORONARY ANGIOGRAPHY N/A 10/12/2024   Procedure: RIGHT HEART CATH AND CORONARY ANGIOGRAPHY;  Surgeon: Verlin Lonni BIRCH, MD;  Location: [facility]VASIVE CV [facility];  Service: Cardiovascular;  Laterality: N/A;   TONSILLECTOMY  as a child   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   Procedure: HIP ARTHROPLASTY ANTERIOR APPROACH;  Surgeon: Addie Cordella Hamilton, MD;  Location: [facility] OR;  Service: Orthopedics;  Laterality: Left;   VAGINAL DELIVERY     x2   VENTRAL HERNIA REPAIR N/A 09/12/2018   Procedure: LAPAROSCOPIC VENTRAL HERNIA REPAIR WITH MESH;  Surgeon: Curvin Deward MOULD, MD;  Location: [facility] OR;  Service: General;  Laterality: N/A;   VIDEO  BRONCHOSCOPY Bilateral 11/07/2019   Procedure: VIDEO BRONCHOSCOPY WITH FLUORO;  Surgeon: Shellia Oh, MD;  Location: [facility]OSCOPY;  Service: Endoscopy;  Laterality: Bilateral;    SOCIAL HISTORY: Social History   Socioeconomic History   Marital status: Married    Spouse name: Not on file   Number of children: Not on file   Years of education: Not on file   Highest education level: Not on file  Occupational History   Not on file  Tobacco Use   Smoking status: Never   Smokeless tobacco: Never  Substance and Sexual Activity   Alcohol  use: Yes    Comment: daily- wine & bourbon & ginger    Drug use: No   Sexual activity: Not on file  Other Topics Concern   Not on file  Social History Narrative   Not on file   Social Drivers of Health   Tobacco Use: Low Risk (11/07/2024)   Patient History    Smoking Tobacco Use: Never    Smokeless Tobacco Use: Never    Passive Exposure: Not on file  Financial Resource Strain: Not on file  Food Insecurity: No Food Insecurity (11/07/2024)   Epic    Worried About Programme Researcher, Broadcasting/film/video in the Last Year: Never true    Ran Out of Food in the Last Year: Never true  Transportation Needs: No Transportation Needs (11/07/2024)   Epic    Lack of Transportation (Medical): No    Lack of Transportation (Non-Medical): No  Physical Activity: Not on file  Stress: Not on file  Social Connections: Moderately Integrated (11/07/2024)   Social Connection and Isolation Panel    Frequency of Communication with Friends and Family: More than three times a week    Frequency of Social Gatherings with Friends and Family: More than three times a week    Attends Religious Services: 1 to 4 times per year    Active Member of [employer] or Organizations: No    Attends Banker Meetings: Never    Marital Status: Married  Catering Manager Violence: Not At Risk (11/07/2024)   Epic    Fear of Current or Ex-Partner: No    Emotionally Abused: No    Physically Abused:  No    Sexually Abused: No  Depression (PHQ2-9): Low Risk (11/03/2024)   Depression (PHQ2-9)    PHQ-2 Score: 0  Alcohol  Screen: Not on file  Housing: Low Risk (11/07/2024)   Epic    Unable to Pay for Housing in the Last Year: No    Number of Times Moved in the Last Year: 0    Homeless in the Last Year: No  Utilities: Not At Risk (11/07/2024)   Epic    Threatened with loss of utilities: No  Health Literacy: Not on file    FAMILY HISTORY: Family History  Problem Relation Age of Onset   Breast cancer Daughter    Breast cancer Maternal Grandfather     ALLERGIES:  is allergic to amlodipine , atorvastatin, lisinopril, meloxicam, and aleve [naproxen].  MEDICATIONS:  Current Outpatient  Medications  Medication Sig Dispense Refill   acetaminophen  (TYLENOL ) 650 MG CR tablet Take 1,300 mg by mouth every 8 (eight) hours as needed for pain.     amoxicillin  (AMOXIL ) 500 MG capsule Take 4 capsules (2,000 mg total) by mouth as directed, 1 hour prior to dental work including cleanings 12 capsule 12   anagrelide  (AGRYLIN) 0.5 MG capsule Take 1 capsule (0.5 mg total) by mouth 2 (two) times daily. 60 capsule 3   apixaban  (ELIQUIS ) 5 MG TABS tablet Take 1 tablet (5 mg total) by mouth 2 (two) times daily. 180 tablet 3   Carboxymethylcellulose Sodium (REFRESH LIQUIGEL) 1 % GEL Place 1 drop into both eyes at bedtime.     diltiazem  (CARDIZEM  CD) 240 MG 24 hr capsule Take 1 capsule (240 mg total) by mouth daily. 90 capsule 3   FOLIC ACID  PO Take 1 mg by mouth in the morning.     furosemide  (LASIX ) 40 MG tablet Take 1 tablet (40 mg total) by mouth daily. 90 tablet 3   levothyroxine  (SYNTHROID , LEVOTHROID) 25 MCG tablet Take 25 mcg by mouth daily before breakfast.      methotrexate  (RHEUMATREX) 2.5 MG tablet Take 12.5 mg by mouth every Friday.      Multiple Minerals-Vitamins (CALCIUM-MAGNESIUM -ZINC-D3 PO) Take 2 tablets by mouth in the morning.     Multiple Vitamin (MULTIVITAMIN WITH MINERALS) TABS tablet  Take 1 tablet by mouth in the morning. One A Day Women's Multivitamin     omeprazole (PRILOSEC) 40 MG capsule Take 40 mg by mouth in the morning.     PARoxetine  (PAXIL ) 20 MG tablet Take 20 mg by mouth daily.     polyethylene glycol (MIRALAX / GLYCOLAX) packet Take 17 g by mouth 2 (two) times a week.     Polyvinyl Alcohol -Povidone PF (REFRESH) 1.4-0.6 % SOLN Place 1 drop into both eyes daily.     Probiotic Product (PROBIOTIC DAILY PO) Take 1 capsule by mouth in the morning.     simvastatin  (ZOCOR ) 40 MG tablet Take 40 mg by mouth daily.     No current facility-administered medications for this visit.    REVIEW OF SYSTEMS:   Constitutional: ( - ) fevers, ( - )  chills , ( - ) night sweats Eyes: ( - ) blurriness of vision, ( - ) double vision, ( - ) watery eyes Ears, nose, mouth, throat, and face: ( - ) mucositis, ( - ) sore throat Respiratory: ( - ) cough, ( - ) dyspnea, ( - ) wheezes Cardiovascular: ( - ) palpitation, ( - ) chest discomfort, ( - ) lower extremity swelling Gastrointestinal:  ( - ) nausea, ( - ) heartburn, ( - ) change in bowel habits Skin: ( - ) abnormal skin rashes Lymphatics: ( - ) new lymphadenopathy, ( - ) easy bruising Neurological: ( - ) numbness, ( - ) tingling, ( - ) new weaknesses Behavioral/Psych: ( - ) mood change, ( - ) new changes  All other systems were reviewed with the patient and are negative.  PHYSICAL EXAMINATION:  There were no vitals filed for this visit.    There were no vitals filed for this visit.     GENERAL: Well-appearing elderly Caucasian female, alert, no distress and comfortable SKIN: skin color, texture, turgor are normal, no rashes or significant lesions EYES: conjunctiva are pink and non-injected, sclera clear LUNGS: clear to auscultation and percussion with normal breathing effort HEART: regular rate & rhythm and no murmurs and no lower  extremity edema Musculoskeletal: no cyanosis of digits and no clubbing  PSYCH: alert &  oriented x 3, fluent speech NEURO: no focal motor/sensory deficits  LABORATORY DATA:  I have reviewed the data as listed    Latest Ref Rng & Units 12/08/2024    2:16 PM 11/22/2024    2:34 PM 11/13/2024   11:19 AM  CBC  WBC 4.0 - 10.5 K/uL 8.6  10.0  7.8   Hemoglobin 12.0 - 15.0 g/dL 89.9  89.4  8.2   Hematocrit 36.0 - 46.0 % 29.8  30.9  24.9   Platelets 150 - 400 K/uL 445  513  542        Latest Ref Rng & Units 12/08/2024    2:16 PM 11/22/2024    2:34 PM 11/08/2024    2:24 PM  CMP  Glucose 70 - 99 mg/dL 853  880  887   BUN 8 - 23 mg/dL 25  21  22    Creatinine 0.44 - 1.00 mg/dL 8.77  8.79  8.74   Sodium 135 - 145 mmol/L 134  133  136   Potassium 3.5 - 5.1 mmol/L 3.8  4.4  4.0   Chloride 98 - 111 mmol/L 97  94  99   CO2 22 - 32 mmol/L 25  28  27    Calcium 8.9 - 10.3 mg/dL 9.2  9.5  8.4   Total Protein 6.5 - 8.1 g/dL 7.4  7.6    Total Bilirubin 0.0 - 1.2 mg/dL 0.3  0.3    Alkaline Phos 38 - 126 U/L 95  95    AST 15 - 41 U/L 24  28    ALT 0 - 44 U/L 10  12       RADIOGRAPHIC STUDIES: ECHOCARDIOGRAM COMPLETE Result Date: 12/14/2024    ECHOCARDIOGRAM REPORT   Patient Name:   Rhonda [name] Date of Exam: 12/14/2024 Medical Rec #:  [id number]      Height:       62.0 in Accession #:    [id number]     Weight:       170.8 lb Date of Birth:  08/05/1941      BSA:          1.788 m Patient Age:    83 years       BP:           140/70 mmHg Patient Gender: F              HR:           92 bpm. Exam Location:  Outpatient Procedure: 2D Echo, 3D Echo, Cardiac Doppler, Color Doppler and Intracardiac            Opacification Agent (Both Spectral and Color Flow Doppler were            utilized during procedure). Indications:    POST TAVR  History:        Patient has prior history of Echocardiogram examinations, most                 recent 11/08/2024. CHF; Risk Factors:Non-Smoker, Hypertension                 and Dyslipidemia.  Sonographer:    Orvil Holmes RDCS Referring Phys: [id number] KATHRYN R  THOMPSON IMPRESSIONS  1. Left ventricular ejection fraction, by estimation, is 55 to 60%. The left ventricle has normal function. The left ventricle has no regional wall motion abnormalities. There is mild left ventricular  hypertrophy. Left ventricular diastolic parameters are indeterminate.  2. Right ventricular systolic function is hyperdynamic. The right ventricular size is normal.  3. Left atrial size was moderately dilated.  4. The mitral valve is degenerative. Mild mitral valve regurgitation. No evidence of mitral stenosis.  5. 26 mm Sapien Valve: Mean gradient 7 mm Hg, Peak gradient 13 mm Hg, DVI 0.5, EOA 1.6 cm2; No PVL. The aortic valve has been repaired/replaced. Aortic valve regurgitation is not visualized. Aortic valve mean gradient measures 7.0 mmHg.  6. The inferior vena cava is normal in size with greater than 50% respiratory variability, suggesting right atrial pressure of 3 mmHg. Comparison(s): No significant change from prior study. FINDINGS  Left Ventricle: Left ventricular ejection fraction, by estimation, is 55 to 60%. The left ventricle has normal function. The left ventricle has no regional wall motion abnormalities. Definity  contrast agent was given IV to delineate the left ventricular  endocardial borders. The left ventricular internal cavity size was normal in size. There is mild left ventricular hypertrophy. Left ventricular diastolic parameters are indeterminate. Right Ventricle: The right ventricular size is normal. No increase in right ventricular wall thickness. Right ventricular systolic function is hyperdynamic. Left Atrium: Left atrial size was moderately dilated. Right Atrium: Right atrial size was normal in size. Pericardium: There is no evidence of pericardial effusion. Mitral Valve: The mitral valve is degenerative in appearance. Mild mitral valve regurgitation. No evidence of mitral valve stenosis. Tricuspid Valve: The tricuspid valve is not well visualized. Tricuspid valve  regurgitation is trivial. Aortic Valve: 26 mm Sapien Valve: Mean gradient 7 mm Hg, Peak gradient 13 mm Hg, DVI 0.5, EOA 1.6 cm2; No PVL. The aortic valve has been repaired/replaced. Aortic valve regurgitation is not visualized. Aortic valve mean gradient measures 7.0 mmHg. Aortic  valve peak gradient measures 12.7 mmHg. Aortic valve area, by VTI measures 1.56 cm. Pulmonic Valve: The pulmonic valve was not well visualized. Pulmonic valve regurgitation is trivial. Aorta: The aortic root and ascending aorta are structurally normal, with no evidence of dilitation. Venous: The inferior vena cava is normal in size with greater than 50% respiratory variability, suggesting right atrial pressure of 3 mmHg. IAS/Shunts: The interatrial septum was not well visualized. Additional Comments: 3D was performed not requiring image post processing on an independent workstation and was indeterminate.  LEFT VENTRICLE PLAX 2D LVIDd:         4.00 cm   Diastology LVIDs:         2.23 cm   LV e' medial:    8.92 cm/s LV PW:         1.24 cm   LV E/e' medial:  11.5 LV IVS:        1.13 cm   LV e' lateral:   11.50 cm/s LVOT diam:     2.00 cm   LV E/e' lateral: 9.0 LV SV:         45 LV SV Index:   25 LVOT Area:     3.14 cm                           3D Volume EF:                          3D EF:        49 %  LV EDV:       127 ml                          LV ESV:       65 ml                          LV SV:        62 ml RIGHT VENTRICLE RV Basal diam:  3.63 cm     PULMONARY VEINS RV Mid diam:    3.45 cm     A Reversal Velocity: 37.40 cm/s RV S prime:     12.00 cm/s  Diastolic Velocity:  58.30 cm/s TAPSE (M-mode): 2.2 cm      S/D Velocity:        1.10                             Systolic Velocity:   61.60 cm/s LEFT ATRIUM           Index        RIGHT ATRIUM           Index LA diam:      4.80 cm 2.69 cm/m   RA Area:     12.60 cm LA Vol (A2C): 67.4 ml 37.70 ml/m  RA Volume:   30.40 ml  17.01 ml/m LA Vol (A4C): 75.9 ml 42.46  ml/m  AORTIC VALVE AV Area (Vmax):    1.38 cm AV Area (Vmean):   1.36 cm AV Area (VTI):     1.56 cm AV Vmax:           178.00 cm/s AV Vmean:          121.000 cm/s AV VTI:            0.288 m AV Peak Grad:      12.7 mmHg AV Mean Grad:      7.0 mmHg LVOT Vmax:         78.10 cm/s LVOT Vmean:        52.400 cm/s LVOT VTI:          0.143 m LVOT/AV VTI ratio: 0.50  AORTA Ao Root diam: 2.30 cm Ao Asc diam:  3.20 cm MITRAL VALVE MV Area (PHT): 5.66 cm       SHUNTS MV Decel Time: 134 msec       Systemic VTI:  0.14 m MR Peak grad:    113.2 mmHg   Systemic Diam: 2.00 cm MR Mean grad:    81.0 mmHg MR Vmax:         532.00 cm/s MR Vmean:        428.0 cm/s MR PISA:         3.08 cm MR PISA Eff ROA: 18 mm MR PISA Radius:  0.70 cm MV E velocity: 103.00 cm/s MV A velocity: 42.40 cm/s MV E/A ratio:  2.43 Stanly Leavens MD Electronically signed by Stanly Leavens MD Signature Date/Time: 12/14/2024/4:10:37 PM    Final     ASSESSMENT & PLAN Rhonda [name] is a 84 y.o. female with medical history significant for newly diagnosed essential thrombocytosis who presents for a follow up visit.  # Essential thrombocytosis, CALR Positive --Diagnosis confirmed by the presence of a CALR mutation as well as bone marrow biopsy showing abnormal megakaryocytes. -- Target platelets between 150 and 400.  --Labs today show WBC 8.1,  Hgb 9.9, MCV 90.6, Plt 273, creatinine and LFTs are normal ----continue anagrelide  0.5 mg twice daily.  -- Return to clinic in 4 weeks for labs, 8 weeks for clinic visit   No orders of the defined types were placed in this encounter.   All questions were answered. The patient knows to call the clinic with any problems, questions or concerns.   I have spent a total of 25 minutes minutes of face-to-face and non-face-to-face time, preparing to see the patient,  performing a medically appropriate examination, counseling and educating the patient, documenting clinical information in the electronic  health record, independently interpreting results and communicating results to the patient, and care coordination.   Rhonda IVAR Kidney, MD Department of Hematology/Oncology [facility] Phone: [phone] Pager: [phone] Email: [name]  01/05/2025 7:31 AM  "

## 2025-01-12 ENCOUNTER — Encounter: Payer: Self-pay | Admitting: Student in an Organized Health Care Education/Training Program

## 2025-01-12 ENCOUNTER — Ambulatory Visit: Admitting: Student in an Organized Health Care Education/Training Program

## 2025-01-12 VITALS — BP 146/80 | HR 87 | Ht 62.0 in | Wt 165.0 lb

## 2025-01-12 DIAGNOSIS — I484 Atypical atrial flutter: Secondary | ICD-10-CM | POA: Diagnosis not present

## 2025-01-12 DIAGNOSIS — I4891 Unspecified atrial fibrillation: Secondary | ICD-10-CM

## 2025-01-12 MED ORDER — CEPHALEXIN 500 MG PO CAPS: 500.0000 mg | ORAL_CAPSULE | Freq: Three times a day (TID) | ORAL | 0 refills | Status: AC

## 2025-01-12 NOTE — Progress Notes (Signed)
 " Cardiology Office Note   Date: 01/12/25 ID:  Rhonda [name], DOB [birth year], MRN [id number] PCP: Elliot Charm, MD  [facility] Providers Cardiologist:  Lonni LITTIE Nanas, MD Electrophysiologist:  Donnice DELENA Primus, MD  Structural Heart:  Lurena MARLA Red, MD   History of Present Illness Rhonda [name] is a 84 y.o. female with persistent AF/AFL, severe AS s/p TAVR, HTN and HLD who presents for arrhythmia management.  Discussed the use of AI scribe software for clinical note transcription with the patient, who gave verbal consent to proceed. History of Present Illness Rhonda [name] is an 84 year old female with atrial flutter and valve replacement who presents with rhythm issues and fatigue.  She has been experiencing increased fatigue and a sensation she describes as 'kind of funny' since her valve replacement. She has been informed of having atrial flutter and atrial fibrillation following the procedure. Although she does not feel the rhythm issues, she notes increased fatigue. She is currently on Eliquis  as a blood thinner and recalls being told she had a heart murmur prior to the valve replacement.  She has had epistaxis for the past two days, characterized by significant bleeding with clots.  She mentions a recent dog bite on her lip from a week ago, which she treated with antiseptic cream. She did not receive any antibiotics or medical attention for the bite.  Her leg feels very hot and painful, especially at night, and she notes a lump in the area. This has been present for about two weeks.  She was born in [place], [place] in [place], and has lived in various places including [place], [place], and the [place] . She has two daughters and a grandson.  ROS: fatigue  Studies Reviewed  ECG review 01/12/25: AF/VR 87, QRS 94, QT/c 372/447 11/13/24: AFL/VR 82, QRS 94, QT/c 380/443 11/08/24: AFL/VR 95, QRS 90, QT/c 366/459 11/07/24: AF/VR 73, QRS 100,  QT/c 434/478 11/03/24: NSR 87, QRS 87, QT/c 374/450 10/11/24: ST 102, PR 148, QRS 112, QT/c 363/473  TTE Result date: 12/14/24  1. Left ventricular ejection fraction, by estimation, is 55 to 60%. The  left ventricle has normal function. The left ventricle has no regional  wall motion abnormalities. There is mild left ventricular hypertrophy.  Left ventricular diastolic parameters  are indeterminate.   2. Right ventricular systolic function is hyperdynamic. The right  ventricular size is normal.   3. Left atrial size was moderately dilated.   4. The mitral valve is degenerative. Mild mitral valve regurgitation. No  evidence of mitral stenosis.   5. 26 mm Sapien Valve: Mean gradient 7 mm Hg, Peak gradient 13 mm Hg, DVI  0.5, EOA 1.6 cm2; No PVL. The aortic valve has been repaired/replaced.  Aortic valve regurgitation is not visualized. Aortic valve mean gradient  measures 7.0 mmHg.   6. The inferior vena cava is normal in size with greater than 50%  respiratory variability, suggesting right atrial pressure of 3 mmHg.   Risk Assessment/Calculations  CHA2DS2-VASc Score = 4  This indicates a 4.8% annual risk of stroke. The patient's score is based upon: CHF History: 0 HTN History: 1 Diabetes History: 0 Stroke History: 0 Vascular Disease History: 0 Age Score: 2 Gender Score: 1  Physical Exam VS:  BP (!) 146/80 (BP Location: Left Arm, Patient Position: Sitting, Cuff Size: Normal)   Pulse 87   Ht 5' 2 (1.575 m)   Wt 165 lb (74.8 kg)   SpO2 98%   BMI  30.18 kg/m   Wt Readings from Last 3 Encounters:  01/12/25 165 lb (74.8 kg)  01/05/25 166 lb (75.3 kg)  12/14/24 164 lb 6.4 oz (74.6 kg)    GEN: Well nourished, well developed in no acute distress NECK: No JVD; No carotid bruits CARDIAC: irregular rhythm, rate controlled, no murmurs, rubs, gallops RESPIRATORY: Clear to auscultation without rales, wheezing or rhonchi  ABDOMEN: Soft, non-tender, non-distended EXTREMITIES:  No  edema; No deformity   ASSESSMENT AND PLAN Rhonda [name] is a 84 y.o. female with persistent AF/AFL, severe AS s/p TAVR, HTN and HLD who presents for arrhythmia management. Assessment & Plan Atrial fibrillation and atypical atrial flutter following prosthetic aortic valve replacement Persistent atrial fibrillation and atypical atrial flutter post-valve replacement. Cardioversion recommended for rhythm restoration and symptom assessment. Discussed risks and benefits, agreed to proceed. - Scheduled cardioversion to restore normal rhythm, this is the first episode I have seen and was post TAVR - Continue Eliquis  for anticoagulation. - Monitor symptoms and rhythm post-cardioversion. - Consider ablation or medication if atrial fibrillation recurs and symptoms improve post-cardioversion.  Discussed treatment options today for AF including antiarrhythmic drug therapy and ablation. Discussed risks, recovery and likelihood of success with each treatment strategy. Risk, benefits, and alternatives to EP study and ablation for afib were discussed. These risks include but are not limited to stroke, bleeding, vascular damage, tamponade, perforation, damage to the esophagus, lungs, phrenic nerve and other structures, worsening renal function, coronary vasospasm and death.  Discussed potential need for repeat ablation procedures and antiarrhythmic drugs. For now will proceed with DCCV and see if any arrhythmia recurrence following cardioversion.   Cellulitis of lower limb Recent cellulitis in lower limb. Symptoms include warmth, tenderness, and lump. No systemic infection signs. Differential includes cellulitis versus other infection sources. Antibiotics initiated to reduce chance of infection. She did have a recent dog bite to the mouth but this area on her leg preceded that so less concerned for tx of abnormal infxn like Pasteurella as compared to typical SSTI.  - Prescribed Keflex  for 7 days. - Monitor for  improvement over the next 4-5 days. - Advised to seek medical attention if high fever, somnolence or general malaise with the recent dog bite. She said the dog was UTD on all immunizations and she is as well.    Informed Consent   Shared Decision Making/Informed Consent The risks (stroke, cardiac arrhythmias rarely resulting in the need for a temporary or permanent pacemaker, skin irritation or burns and complications associated with conscious sedation including aspiration, arrhythmia, respiratory failure and death), benefits (restoration of normal sinus rhythm) and alternatives of a direct current cardioversion were explained in detail to Ms. Swoboda and she agrees to proceed.   Dispo: RTC in 3 months with APP, if recurrent atrial arrhythmias after DCCV then would consider medications vs ablation, no known CAD so flecainide would be an option, Tikosyn less ideal with prolonged Qtc in NSR, Multaq as well an option or amio. If recurrent AFL s/p DCCV then would be more inclined to go straight to ablation as likely more difficult to rhythm control.   A total of 45 minutes was spent preparing for the patient, reviewing history, performing exam, document encounter, coordinating care and counseling the patient. 27 minutes was spent with direct patient care.   Signed, Donnice DELENA Primus, MD  "

## 2025-01-12 NOTE — Patient Instructions (Signed)
 Medication Instructions:  Your physician has recommended you make the following change in your medication:   ** Begin Cephalexin  500mg  - 1 capsule by mouth three times daily x 7 days.  *If you need a refill on your cardiac medications before your next appointment, please call your pharmacy*  Lab Work: None ordered.  If you have labs (blood work) drawn today and your tests are completely normal, you will receive your results only by: MyChart Message (if you have MyChart) OR A paper copy in the mail If you have any lab test that is abnormal or we need to change your treatment, we will call you to review the results.  Testing/Procedures: None ordered.   Follow-Up: At [facility], you and your health needs are our priority.  As part of our continuing mission to provide you with exceptional heart care, our providers are all part of one team.  This team includes your primary Cardiologist (physician) and Advanced Practice Providers or APPs (Physician Assistants and Nurse Practitioners) who all work together to provide you with the care you need, when you need it.  Your next appointment:   3 months with EP APP  We recommend signing up for the patient portal called MyChart.  Sign up information is provided on this After Visit Summary.  MyChart is used to connect with patients for Virtual Visits (Telemedicine).  Patients are able to view lab/test results, encounter notes, upcoming appointments, etc.  Non-urgent messages can be sent to your provider as well.   To learn more about what you can do with MyChart, go to [email or web address].   Other Instructions    Dear Rhonda Rhonda  You are scheduled for a Cardioversion on Friday, January 23 with Dr. Francyne.  Please arrive at the [facility] (Main Entrance A) at [facility]: [address] [place], [place] [address] at 9:30 AM (This time is 1 hour(s) before your procedure to ensure your preparation).   Free valet parking  service is available. You will check in at ADMITTING.   *Please Note: You will receive a call the day before your procedure to confirm the appointment time. That time may have changed from the original time based on the schedule for that day.*   DIET:  Nothing to eat or drink after midnight except a sip of water  with medications (see medication instructions below)  MEDICATION INSTRUCTIONS: !!IF ANY NEW MEDICATIONS ARE STARTED AFTER TODAY, PLEASE NOTIFY YOUR PROVIDER AS SOON AS POSSIBLE!!   Hold your Furosemide  the morning of your procedure. Continue taking your anticoagulant (blood thinner): Apixaban  (Eliquis ).  You will need to continue this after your procedure until you are told by your provider that it is safe to stop.    LABS: Labs are complete  FYI:  For your safety, and to allow us  to monitor your vital signs accurately during the surgery/procedure we request: If you have artificial nails, gel coating, SNS etc, please have those removed prior to your surgery/procedure. Not having the nail coverings /polish removed may result in cancellation or delay of your surgery/procedure.  Your support person will be asked to wait in the waiting room during your procedure.  It is OK to have someone drop you off and come back when you are ready to be discharged.  You cannot drive after the procedure and will need someone to drive you home.  Bring your insurance cards.  *Special Note: Every effort is made to have your procedure done on time. Occasionally there  are emergencies that occur at the hospital that may cause delays. Please be patient if a delay does occur.

## 2025-01-12 NOTE — H&P (View-Only) (Signed)
 " Cardiology Office Note   Date: 01/12/25 ID:  Rhonda [name], DOB [birth year], MRN [id number] PCP: Elliot Charm, MD  [facility] Providers Cardiologist:  Lonni LITTIE Nanas, MD Electrophysiologist:  Donnice DELENA Primus, MD  Structural Heart:  Lurena MARLA Red, MD   History of Present Illness Rhonda [name] is a 84 y.o. female with persistent AF/AFL, severe AS s/p TAVR, HTN and HLD who presents for arrhythmia management.  Discussed the use of AI scribe software for clinical note transcription with the patient, who gave verbal consent to proceed. History of Present Illness Rhonda [name] is an 84 year old female with atrial flutter and valve replacement who presents with rhythm issues and fatigue.  She has been experiencing increased fatigue and a sensation she describes as 'kind of funny' since her valve replacement. She has been informed of having atrial flutter and atrial fibrillation following the procedure. Although she does not feel the rhythm issues, she notes increased fatigue. She is currently on Eliquis  as a blood thinner and recalls being told she had a heart murmur prior to the valve replacement.  She has had epistaxis for the past two days, characterized by significant bleeding with clots.  She mentions a recent dog bite on her lip from a week ago, which she treated with antiseptic cream. She did not receive any antibiotics or medical attention for the bite.  Her leg feels very hot and painful, especially at night, and she notes a lump in the area. This has been present for about two weeks.  She was born in [place] in [place], and has lived in various places including [place], [place], and the [place] . She has two daughters and a grandson.  ROS: fatigue  Studies Reviewed  ECG review 01/12/25: AF/VR 87, QRS 94, QT/c 372/447 11/13/24: AFL/VR 82, QRS 94, QT/c 380/443 11/08/24: AFL/VR 95, QRS 90, QT/c 366/459 11/07/24: AF/VR 73, QRS 100,  QT/c 434/478 11/03/24: NSR 87, QRS 87, QT/c 374/450 10/11/24: ST 102, PR 148, QRS 112, QT/c 363/473  TTE Result date: 12/14/24  1. Left ventricular ejection fraction, by estimation, is 55 to 60%. The  left ventricle has normal function. The left ventricle has no regional  wall motion abnormalities. There is mild left ventricular hypertrophy.  Left ventricular diastolic parameters  are indeterminate.   2. Right ventricular systolic function is hyperdynamic. The right  ventricular size is normal.   3. Left atrial size was moderately dilated.   4. The mitral valve is degenerative. Mild mitral valve regurgitation. No  evidence of mitral stenosis.   5. 26 mm Sapien Valve: Mean gradient 7 mm Hg, Peak gradient 13 mm Hg, DVI  0.5, EOA 1.6 cm2; No PVL. The aortic valve has been repaired/replaced.  Aortic valve regurgitation is not visualized. Aortic valve mean gradient  measures 7.0 mmHg.   6. The inferior vena cava is normal in size with greater than 50%  respiratory variability, suggesting right atrial pressure of 3 mmHg.   Risk Assessment/Calculations  CHA2DS2-VASc Score = 4  This indicates a 4.8% annual risk of stroke. The patient's score is based upon: CHF History: 0 HTN History: 1 Diabetes History: 0 Stroke History: 0 Vascular Disease History: 0 Age Score: 2 Gender Score: 1  Physical Exam VS:  BP (!) 146/80 (BP Location: Left Arm, Patient Position: Sitting, Cuff Size: Normal)   Pulse 87   Ht 5' 2 (1.575 m)   Wt 165 lb (74.8 kg)   SpO2 98%   BMI  30.18 kg/m   Wt Readings from Last 3 Encounters:  01/12/25 165 lb (74.8 kg)  01/05/25 166 lb (75.3 kg)  12/14/24 164 lb 6.4 oz (74.6 kg)    GEN: Well nourished, well developed in no acute distress NECK: No JVD; No carotid bruits CARDIAC: irregular rhythm, rate controlled, no murmurs, rubs, gallops RESPIRATORY: Clear to auscultation without rales, wheezing or rhonchi  ABDOMEN: Soft, non-tender, non-distended EXTREMITIES:  No  edema; No deformity   ASSESSMENT AND PLAN Rhonda [name] is a 84 y.o. female with persistent AF/AFL, severe AS s/p TAVR, HTN and HLD who presents for arrhythmia management. Assessment & Plan Atrial fibrillation and atypical atrial flutter following prosthetic aortic valve replacement Persistent atrial fibrillation and atypical atrial flutter post-valve replacement. Cardioversion recommended for rhythm restoration and symptom assessment. Discussed risks and benefits, agreed to proceed. - Scheduled cardioversion to restore normal rhythm, this is the first episode I have seen and was post TAVR - Continue Eliquis  for anticoagulation. - Monitor symptoms and rhythm post-cardioversion. - Consider ablation or medication if atrial fibrillation recurs and symptoms improve post-cardioversion.  Discussed treatment options today for AF including antiarrhythmic drug therapy and ablation. Discussed risks, recovery and likelihood of success with each treatment strategy. Risk, benefits, and alternatives to EP study and ablation for afib were discussed. These risks include but are not limited to stroke, bleeding, vascular damage, tamponade, perforation, damage to the esophagus, lungs, phrenic nerve and other structures, worsening renal function, coronary vasospasm and death.  Discussed potential need for repeat ablation procedures and antiarrhythmic drugs. For now will proceed with DCCV and see if any arrhythmia recurrence following cardioversion.   Cellulitis of lower limb Recent cellulitis in lower limb. Symptoms include warmth, tenderness, and lump. No systemic infection signs. Differential includes cellulitis versus other infection sources. Antibiotics initiated to reduce chance of infection. She did have a recent dog bite to the mouth but this area on her leg preceded that so less concerned for tx of abnormal infxn like Pasteurella as compared to typical SSTI.  - Prescribed Keflex  for 7 days. - Monitor for  improvement over the next 4-5 days. - Advised to seek medical attention if high fever, somnolence or general malaise with the recent dog bite. She said the dog was UTD on all immunizations and she is as well.    Informed Consent   Shared Decision Making/Informed Consent The risks (stroke, cardiac arrhythmias rarely resulting in the need for a temporary or permanent pacemaker, skin irritation or burns and complications associated with conscious sedation including aspiration, arrhythmia, respiratory failure and death), benefits (restoration of normal sinus rhythm) and alternatives of a direct current cardioversion were explained in detail to Ms. Swoboda and she agrees to proceed.   Dispo: RTC in 3 months with APP, if recurrent atrial arrhythmias after DCCV then would consider medications vs ablation, no known CAD so flecainide would be an option, Tikosyn less ideal with prolonged Qtc in NSR, Multaq as well an option or amio. If recurrent AFL s/p DCCV then would be more inclined to go straight to ablation as likely more difficult to rhythm control.   A total of 45 minutes was spent preparing for the patient, reviewing history, performing exam, document encounter, coordinating care and counseling the patient. 27 minutes was spent with direct patient care.   Signed, Donnice DELENA Primus, MD  "

## 2025-01-19 ENCOUNTER — Encounter (HOSPITAL_COMMUNITY): Payer: Self-pay | Admitting: Cardiovascular Disease

## 2025-01-19 ENCOUNTER — Other Ambulatory Visit: Payer: Self-pay

## 2025-01-19 ENCOUNTER — Ambulatory Visit (HOSPITAL_COMMUNITY): Admitting: Anesthesiology

## 2025-01-19 ENCOUNTER — Ambulatory Visit (HOSPITAL_COMMUNITY)
Admission: RE | Admit: 2025-01-19 | Discharge: 2025-01-19 | Disposition: A | Discharge status: Home / Self Care | Attending: Cardiovascular Disease | Admitting: Cardiovascular Disease

## 2025-01-19 ENCOUNTER — Encounter (HOSPITAL_COMMUNITY)
Admission: RE | Disposition: A | Discharge status: Home / Self Care | Payer: Self-pay | Source: Home / Self Care | Attending: Cardiovascular Disease

## 2025-01-19 DIAGNOSIS — L03119 Cellulitis of unspecified part of limb: Secondary | ICD-10-CM | POA: Insufficient documentation

## 2025-01-19 DIAGNOSIS — I4819 Other persistent atrial fibrillation: Secondary | ICD-10-CM | POA: Insufficient documentation

## 2025-01-19 DIAGNOSIS — I509 Heart failure, unspecified: Secondary | ICD-10-CM | POA: Insufficient documentation

## 2025-01-19 DIAGNOSIS — E785 Hyperlipidemia, unspecified: Secondary | ICD-10-CM | POA: Insufficient documentation

## 2025-01-19 DIAGNOSIS — Z7901 Long term (current) use of anticoagulants: Secondary | ICD-10-CM | POA: Diagnosis not present

## 2025-01-19 DIAGNOSIS — I484 Atypical atrial flutter: Secondary | ICD-10-CM | POA: Diagnosis not present

## 2025-01-19 DIAGNOSIS — E039 Hypothyroidism, unspecified: Secondary | ICD-10-CM

## 2025-01-19 DIAGNOSIS — I5033 Acute on chronic diastolic (congestive) heart failure: Secondary | ICD-10-CM | POA: Diagnosis not present

## 2025-01-19 DIAGNOSIS — I4891 Unspecified atrial fibrillation: Secondary | ICD-10-CM | POA: Diagnosis not present

## 2025-01-19 DIAGNOSIS — I110 Hypertensive heart disease with heart failure: Secondary | ICD-10-CM | POA: Insufficient documentation

## 2025-01-19 DIAGNOSIS — Z952 Presence of prosthetic heart valve: Secondary | ICD-10-CM | POA: Diagnosis not present

## 2025-01-19 MED ORDER — SODIUM CHLORIDE 0.9 % IV SOLN: INTRAVENOUS | Status: DC

## 2025-01-19 MED ORDER — PROPOFOL 10 MG/ML IV BOLUS
INTRAVENOUS | Status: DC | PRN
  Administered 2025-01-19: 50 mg via INTRAVENOUS

## 2025-01-19 MED ORDER — LIDOCAINE 2% (20 MG/ML) 5 ML SYRINGE
INTRAMUSCULAR | Status: DC | PRN
  Administered 2025-01-19: 60 mg via INTRAVENOUS

## 2025-01-19 NOTE — Interval H&P Note (Signed)
 History and Physical Interval Note:  01/19/2025 9:43 AM  Rhonda Rhonda  has presented today for surgery, with the diagnosis of AFIB.  The various methods of treatment have been discussed with the patient and family. After consideration of risks, benefits and other options for treatment, the patient has consented to  Procedures: CARDIOVERSION (N/A) as a surgical intervention.  The patient's history has been reviewed, patient examined, no change in status, stable for surgery.  I have reviewed the patient's chart and labs.  Questions were answered to the patient's satisfaction.     Lendell Gallick

## 2025-01-19 NOTE — Anesthesia Preprocedure Evaluation (Signed)
"                                    Anesthesia Evaluation  Patient identified by MRN, date of birth, ID band Patient awake    Reviewed: Allergy & Precautions, H&P , NPO status , Patient's Chart, lab work & pertinent test results  Airway Mallampati: II   Neck ROM: full    Dental   Pulmonary shortness of breath   breath sounds clear to auscultation       Cardiovascular hypertension, +CHF  + dysrhythmias Atrial Fibrillation  Rhythm:regular Rate:Normal     Neuro/Psych  PSYCHIATRIC DISORDERS Anxiety Depression       GI/Hepatic ,GERD  ,,  Endo/Other  Hypothyroidism    Renal/GU      Musculoskeletal  (+) Arthritis ,    Abdominal   Peds  Hematology   Anesthesia Other Findings   Reproductive/Obstetrics                              Anesthesia Physical Anesthesia Plan  ASA: 3  Anesthesia Plan: General   Post-op Pain Management:    Induction: Intravenous  PONV Risk Score and Plan: 3 and Propofol  infusion and Treatment may vary due to age or medical condition  Airway Management Planned: Nasal Cannula  Additional Equipment:   Intra-op Plan:   Post-operative Plan:   Informed Consent: I have reviewed the patients History and Physical, chart, labs and discussed the procedure including the risks, benefits and alternatives for the proposed anesthesia with the patient or authorized representative who has indicated his/her understanding and acceptance.     Dental advisory given  Plan Discussed with: CRNA, Anesthesiologist and Surgeon  Anesthesia Plan Comments:         Anesthesia Quick Evaluation  "

## 2025-01-19 NOTE — Anesthesia Postprocedure Evaluation (Signed)
"   Anesthesia Post Note  Patient: Rhonda Rhonda  Procedure(s) Performed: CARDIOVERSION     Patient location during evaluation: Cath Lab Anesthesia Type: General Level of consciousness: awake and alert Pain management: pain level controlled Vital Signs Assessment: post-procedure vital signs reviewed and stable Respiratory status: spontaneous breathing, nonlabored ventilation, respiratory function stable and patient connected to nasal cannula oxygen Cardiovascular status: blood pressure returned to baseline and stable Postop Assessment: no apparent nausea or vomiting Anesthetic complications: no   There were no known notable events for this encounter.  Last Vitals:  Vitals:   01/19/25 1015 01/19/25 1020  BP: (!) 127/58 (!) 128/56  Pulse: 79 78  Resp: (!) 23 19  Temp:    SpO2: 95% 94%    Last Pain:  Vitals:   01/19/25 0930  TempSrc:   PainSc: 0-No pain                 Kinisha Soper S      "

## 2025-01-19 NOTE — Transfer of Care (Signed)
 Immediate Anesthesia Transfer of Care Note  Patient: Rhonda Rhonda  Procedure(s) Performed: CARDIOVERSION  Patient Location: Cath Lab  Anesthesia Type:General  Level of Consciousness: awake, alert , and oriented  Airway & Oxygen Therapy: Patient Spontanous Breathing and Patient connected to nasal cannula oxygen  Post-op Assessment: Report given to RN and Post -op Vital signs reviewed and stable  Post vital signs: Reviewed and stable  Last Vitals:  Vitals Value Taken Time  BP 127/60 0952  Temp 36 0953  Pulse 76 0953  Resp 16 0953  SpO2 98 0953    Last Pain:  Vitals:   01/19/25 0930  TempSrc:   PainSc: 0-No pain         Complications: There were no known notable events for this encounter.

## 2025-01-19 NOTE — Op Note (Signed)
 Procedure: Electrical Cardioversion Indications:  Atrial Fibrillation  Procedure Details:  Consent: Risks of procedure as well as the alternatives and risks of each were explained to the (patient/caregiver).  Consent for procedure obtained.  Time Out: Verified patient identification, verified procedure, site/side was marked, verified correct patient position, special equipment/implants available, medications/allergies/relevent history reviewed, required imaging and test results available.  Performed  Patient placed on cardiac monitor, pulse oximetry, supplemental oxygen as necessary.  Sedation given: propofol  IV, Dr. Maryclare Pacer pads placed anterior and posterior chest.  Cardioverted 1 time(s).  Cardioversion with synchronized biphasic 200J shock.  Evaluation: Findings: Post procedure EKG shows: NSR Complications: None Patient did tolerate procedure well.  Time Spent Directly with the Patient:  30 minutes   Rhonda [name] 01/19/2025, 9:51 AM

## 2025-01-21 ENCOUNTER — Encounter (HOSPITAL_COMMUNITY): Payer: Self-pay | Admitting: Cardiovascular Disease

## 2025-02-01 ENCOUNTER — Telehealth: Payer: Self-pay | Admitting: Hematology and Oncology

## 2025-02-01 NOTE — Telephone Encounter (Signed)
 Pt husband called to reschedule lab

## 2025-02-02 ENCOUNTER — Inpatient Hospital Stay

## 2025-02-05 ENCOUNTER — Inpatient Hospital Stay: Attending: Hematology and Oncology

## 2025-03-02 ENCOUNTER — Inpatient Hospital Stay: Attending: Hematology and Oncology | Admitting: Hematology and Oncology

## 2025-03-02 ENCOUNTER — Inpatient Hospital Stay

## 2025-11-08 ENCOUNTER — Ambulatory Visit (HOSPITAL_COMMUNITY)

## 2025-11-08 ENCOUNTER — Ambulatory Visit: Admitting: Physician Assistant
